# Patient Record
Sex: Female | Born: 1940 | Race: White | Hispanic: No | Smoking: Never smoker
Health system: Southern US, Community
[De-identification: ages and names within clinical notes are randomized; demographics above are authoritative.]

## PROBLEM LIST (undated history)

## (undated) DIAGNOSIS — E119 Type 2 diabetes mellitus without complications: Secondary | ICD-10-CM

## (undated) DIAGNOSIS — I4891 Unspecified atrial fibrillation: Secondary | ICD-10-CM

## (undated) DIAGNOSIS — R42 Dizziness and giddiness: Secondary | ICD-10-CM

## (undated) DIAGNOSIS — I10 Essential (primary) hypertension: Secondary | ICD-10-CM

## (undated) DIAGNOSIS — E7800 Pure hypercholesterolemia, unspecified: Secondary | ICD-10-CM

## (undated) HISTORY — PX: CHOLECYSTECTOMY: SHX55

## (undated) HISTORY — PX: CYST EXCISION: SHX5701

## (undated) HISTORY — PX: ABDOMINAL HYSTERECTOMY: SHX81

---

## 2003-10-10 ENCOUNTER — Ambulatory Visit (HOSPITAL_COMMUNITY): Admission: RE | Admit: 2003-10-10 | Discharge: 2003-10-10 | Payer: Self-pay | Admitting: Family Medicine

## 2003-10-10 ENCOUNTER — Encounter: Payer: Self-pay | Admitting: Family Medicine

## 2006-03-31 ENCOUNTER — Ambulatory Visit (HOSPITAL_COMMUNITY): Admission: RE | Admit: 2006-03-31 | Discharge: 2006-03-31 | Payer: Self-pay | Admitting: Emergency Medicine

## 2007-10-27 ENCOUNTER — Ambulatory Visit (HOSPITAL_COMMUNITY): Admission: RE | Admit: 2007-10-27 | Discharge: 2007-10-27 | Payer: Self-pay | Admitting: Family Medicine

## 2007-11-17 ENCOUNTER — Ambulatory Visit (HOSPITAL_COMMUNITY): Admission: RE | Admit: 2007-11-17 | Discharge: 2007-11-17 | Payer: Self-pay | Admitting: Internal Medicine

## 2007-11-17 ENCOUNTER — Ambulatory Visit: Payer: Self-pay | Admitting: Internal Medicine

## 2008-10-29 ENCOUNTER — Ambulatory Visit (HOSPITAL_COMMUNITY): Admission: RE | Admit: 2008-10-29 | Discharge: 2008-10-29 | Payer: Self-pay | Admitting: Family Medicine

## 2009-10-31 ENCOUNTER — Ambulatory Visit (HOSPITAL_COMMUNITY): Admission: RE | Admit: 2009-10-31 | Discharge: 2009-10-31 | Payer: Self-pay | Admitting: Family Medicine

## 2009-12-05 ENCOUNTER — Ambulatory Visit (HOSPITAL_COMMUNITY): Admission: RE | Admit: 2009-12-05 | Discharge: 2009-12-05 | Payer: Self-pay | Admitting: Neurology

## 2010-05-19 ENCOUNTER — Ambulatory Visit (HOSPITAL_COMMUNITY): Admission: RE | Admit: 2010-05-19 | Discharge: 2010-05-19 | Payer: Self-pay | Admitting: Family Medicine

## 2010-11-02 ENCOUNTER — Ambulatory Visit (HOSPITAL_COMMUNITY): Admission: RE | Admit: 2010-11-02 | Discharge: 2010-11-02 | Payer: Self-pay | Admitting: Family Medicine

## 2011-05-11 NOTE — Op Note (Signed)
NAME:  Deborah Benjamin, Deborah Benjamin              ACCOUNT NO.:  [id number]   MEDICAL RECORD NO.:  [id number]          PATIENT TYPE:  AMB   LOCATION:  DAY                           FACILITY:  [facility]   PHYSICIAN:  R. Roetta Sessions, M.D. DATE OF BIRTH:  10-01-1941   DATE OF PROCEDURE:  11/17/2007  DATE OF DISCHARGE:  11/17/2007                               OPERATIVE REPORT   PROCEDURE:  Screening colonoscopy.   INDICATIONS FOR PROCEDURE:  A 70 year old lady sent over out of the  courtesy of Dr. Katharine Look for colorectal cancer screening.  Ms.  Soza has never had her lower GI tract imaged previously.  She has no  lower GI tract symptoms.  There is no family history of colorectal  neoplasia.  Colonoscopy is now being done as a standard screening  maneuver.  This approach has been discussed with the patient at length.  Potential risks, benefits and alternatives have been reviewed and  questions answered.  Please see documentation on the medical record.   PROCEDURE NOTE:  O2 saturation, blood pressure, pulses, and respirations  were monitored throughout the entire procedure.  Conscious sedation with  IV Demerol 100 mg and Versed 5 mg in divided doses.   INSTRUMENT:  Pentax video chip system.   FINDINGS:  Digital rectal exam revealed no abnormalities.   ENDOSCOPIC FINDINGS:  Prep was adequate.   COLON:  The colonic mucosa was surveyed from the rectosigmoid junction  through the left transverse, right colon, the appendiceal orifice, the  ileocecal valve, and cecum.  These structures were well seen and  photographed for the record.  From this level, the scope was slowly and  cautiously withdrawn.  All previously mentioned mucosal surfaces were  again seen.  The patient was noted to have left-sided diverticulum.  The  colonic mucosa appeared normal.  The scope was pulled down into the  rectum, where a thorough examination of the rectal mucosa, including a  retroflexed view of the anal verge,  demonstrated no abnormalities.  The  patient tolerated the procedure well and was reactive.   ENDOSCOPY IMPRESSION:  1. Normal rectum.  2. Left-sided diverticulum.  3. Her colonic mucosa appeared normal.   RECOMMENDATIONS:  1. Diverticulosis literature provided for Ms. Duclos.  2. Recommend repeat screening colonoscopy in 10 years.      Jonathon Bellows, M.D.  Electronically Signed     RMR/MEDQ  D:  12/01/2007  T:  12/02/2007  Job:  [id number]   cc:   Mila Homer. Sudie Bailey, M.D.  Fax: [phone]

## 2011-09-24 ENCOUNTER — Other Ambulatory Visit (HOSPITAL_COMMUNITY): Payer: Self-pay | Admitting: Family Medicine

## 2011-09-24 DIAGNOSIS — Z139 Encounter for screening, unspecified: Secondary | ICD-10-CM

## 2011-11-05 ENCOUNTER — Ambulatory Visit (HOSPITAL_COMMUNITY): Payer: Medicare Other

## 2011-11-08 ENCOUNTER — Ambulatory Visit (HOSPITAL_COMMUNITY)
Admission: RE | Admit: 2011-11-08 | Discharge: 2011-11-08 | Disposition: A | Discharge status: Home / Self Care | Payer: Medicare Other | Source: Ambulatory Visit | Attending: Family Medicine | Admitting: Family Medicine

## 2011-11-08 DIAGNOSIS — Z1231 Encounter for screening mammogram for malignant neoplasm of breast: Secondary | ICD-10-CM | POA: Insufficient documentation

## 2011-11-08 DIAGNOSIS — Z139 Encounter for screening, unspecified: Secondary | ICD-10-CM

## 2012-11-01 ENCOUNTER — Other Ambulatory Visit (HOSPITAL_COMMUNITY): Payer: Self-pay | Admitting: Family Medicine

## 2012-11-01 DIAGNOSIS — M810 Age-related osteoporosis without current pathological fracture: Secondary | ICD-10-CM

## 2012-11-03 ENCOUNTER — Ambulatory Visit (HOSPITAL_COMMUNITY)
Admission: RE | Admit: 2012-11-03 | Discharge: 2012-11-03 | Disposition: A | Discharge status: Home / Self Care | Payer: Medicare Other | Source: Ambulatory Visit | Attending: Family Medicine | Admitting: Family Medicine

## 2012-11-03 ENCOUNTER — Other Ambulatory Visit (HOSPITAL_COMMUNITY): Payer: Medicare Other

## 2012-11-03 DIAGNOSIS — E559 Vitamin D deficiency, unspecified: Secondary | ICD-10-CM | POA: Insufficient documentation

## 2012-11-03 DIAGNOSIS — Z780 Asymptomatic menopausal state: Secondary | ICD-10-CM | POA: Insufficient documentation

## 2012-11-03 DIAGNOSIS — M810 Age-related osteoporosis without current pathological fracture: Secondary | ICD-10-CM | POA: Insufficient documentation

## 2012-11-22 ENCOUNTER — Other Ambulatory Visit (HOSPITAL_COMMUNITY): Payer: Self-pay | Admitting: Family Medicine

## 2012-11-22 DIAGNOSIS — Z139 Encounter for screening, unspecified: Secondary | ICD-10-CM

## 2012-11-30 ENCOUNTER — Ambulatory Visit (HOSPITAL_COMMUNITY): Payer: Medicare Other

## 2012-12-07 ENCOUNTER — Ambulatory Visit (HOSPITAL_COMMUNITY)
Admission: RE | Admit: 2012-12-07 | Discharge: 2012-12-07 | Disposition: A | Discharge status: Home / Self Care | Payer: Medicare Other | Source: Ambulatory Visit | Attending: Family Medicine | Admitting: Family Medicine

## 2012-12-07 DIAGNOSIS — Z1231 Encounter for screening mammogram for malignant neoplasm of breast: Secondary | ICD-10-CM | POA: Insufficient documentation

## 2012-12-07 DIAGNOSIS — Z139 Encounter for screening, unspecified: Secondary | ICD-10-CM

## 2013-06-26 ENCOUNTER — Encounter (HOSPITAL_COMMUNITY): Payer: Self-pay | Admitting: *Deleted

## 2013-06-26 ENCOUNTER — Emergency Department (HOSPITAL_COMMUNITY)
Admission: EM | Admit: 2013-06-26 | Discharge: 2013-06-26 | Disposition: A | Discharge status: Home / Self Care | Payer: Medicare Other | Attending: Emergency Medicine | Admitting: Emergency Medicine

## 2013-06-26 DIAGNOSIS — I10 Essential (primary) hypertension: Secondary | ICD-10-CM | POA: Insufficient documentation

## 2013-06-26 DIAGNOSIS — E860 Dehydration: Secondary | ICD-10-CM | POA: Insufficient documentation

## 2013-06-26 DIAGNOSIS — D649 Anemia, unspecified: Secondary | ICD-10-CM | POA: Insufficient documentation

## 2013-06-26 DIAGNOSIS — R112 Nausea with vomiting, unspecified: Secondary | ICD-10-CM | POA: Insufficient documentation

## 2013-06-26 DIAGNOSIS — H538 Other visual disturbances: Secondary | ICD-10-CM | POA: Insufficient documentation

## 2013-06-26 DIAGNOSIS — E7800 Pure hypercholesterolemia, unspecified: Secondary | ICD-10-CM | POA: Insufficient documentation

## 2013-06-26 DIAGNOSIS — Z79899 Other long term (current) drug therapy: Secondary | ICD-10-CM | POA: Insufficient documentation

## 2013-06-26 DIAGNOSIS — R42 Dizziness and giddiness: Secondary | ICD-10-CM | POA: Insufficient documentation

## 2013-06-26 DIAGNOSIS — E119 Type 2 diabetes mellitus without complications: Secondary | ICD-10-CM | POA: Insufficient documentation

## 2013-06-26 HISTORY — DX: Pure hypercholesterolemia, unspecified: E78.00

## 2013-06-26 HISTORY — DX: Dizziness and giddiness: R42

## 2013-06-26 HISTORY — DX: Type 2 diabetes mellitus without complications: E11.9

## 2013-06-26 HISTORY — DX: Essential (primary) hypertension: I10

## 2013-06-26 LAB — POCT I-STAT, CHEM 8
Chloride: 90 mEq/L — ABNORMAL LOW (ref 96–112)
HCT: 29 % — ABNORMAL LOW (ref 36.0–46.0)
Hemoglobin: 9.9 g/dL — ABNORMAL LOW (ref 12.0–15.0)
Potassium: 3.3 mEq/L — ABNORMAL LOW (ref 3.5–5.1)
Sodium: 130 mEq/L — ABNORMAL LOW (ref 135–145)

## 2013-06-26 LAB — CBC WITH DIFFERENTIAL/PLATELET
Basophils Absolute: 0 10*3/uL (ref 0.0–0.1)
Basophils Relative: 0 % (ref 0–1)
HCT: 26.8 % — ABNORMAL LOW (ref 36.0–46.0)
Hemoglobin: 9.7 g/dL — ABNORMAL LOW (ref 12.0–15.0)
Lymphocytes Relative: 13 % (ref 12–46)
Monocytes Absolute: 0.8 10*3/uL (ref 0.1–1.0)
Monocytes Relative: 6 % (ref 3–12)
Neutro Abs: 9.7 10*3/uL — ABNORMAL HIGH (ref 1.7–7.7)
Neutrophils Relative %: 80 % — ABNORMAL HIGH (ref 43–77)
WBC: 12.1 10*3/uL — ABNORMAL HIGH (ref 4.0–10.5)

## 2013-06-26 MED ORDER — ONDANSETRON HCL 4 MG/2ML IJ SOLN
4.0000 mg | Freq: Once | INTRAMUSCULAR | Status: DC
  Filled 2013-06-26: qty 2

## 2013-06-26 MED ORDER — MECLIZINE HCL 12.5 MG PO TABS
25.0000 mg | ORAL_TABLET | Freq: Once | ORAL | Status: AC
Start: 2013-06-26 — End: 2013-06-26
  Administered 2013-06-26: 25 mg via ORAL
  Filled 2013-06-26: qty 2

## 2013-06-26 MED ORDER — SODIUM CHLORIDE 0.9 % IV BOLUS (SEPSIS)
1000.0000 mL | Freq: Once | INTRAVENOUS | Status: AC
  Administered 2013-06-26: 1000 mL via INTRAVENOUS

## 2013-06-26 MED ORDER — ONDANSETRON HCL 4 MG PO TABS: 4.0000 mg | ORAL_TABLET | Freq: Three times a day (TID) | ORAL | Status: DC | PRN

## 2013-06-26 MED ORDER — MECLIZINE HCL 25 MG PO TABS: ORAL_TABLET | ORAL | Status: DC

## 2013-06-26 NOTE — ED Notes (Signed)
Patient stated she "was fine as long as she did not lay down".  Patient sitting upright in bed - comfortable.

## 2013-06-26 NOTE — ED Notes (Signed)
Patient unable to lay down.  Nausea/vomiting starts when patient lays down.  Patient became dizzy with nausea and we did not complete the vital signs laying down.

## 2013-06-26 NOTE — ED Notes (Signed)
Pt sent to er from urgent care with c/o dizziness that started yesterday am when she went to get out of bed, pt states that the dizziness is worse when she lays down, is associated with n/v, has had vertigo before but not like this. Pt speech clear, face symmetrical, no weakness noted in grips or weakness with walking. Denies any pain.

## 2013-06-26 NOTE — ED Notes (Signed)
Patient has vertigo with lying down, associated w/nausea and vomiting.  Otherwise has been feeling well.  No carotid bruits, no CP.

## 2013-06-26 NOTE — ED Provider Notes (Addendum)
History  This chart was scribed for [name], MD, by Yevette Edwards, ED Scribe. This patient was seen in room [facility] and the patient's care was started at 11: AM  CSN: [id number] Arrival date & time 06/26/13  1032  First MD Initiated Contact with Patient 06/26/13 1045     Chief Complaint  Patient presents with  . Dizziness    The history is provided by the patient. No language interpreter was used.   HPI Comments:  Deborah Benjamin is a 72 y.o. female who presents to the Emergency Department complaining of dizziness which began yesterday morning. The pt states that the dizziness is exacerbated upon laying down, and that the dizziness causes her to experience emesis. She reports she has experienced emesis multiple times over the past two days. The pt states that when she experiences dizziness, she feels as if the room is spinning, she has color changes in her vision, and she feels as if she is going to "pass out." She denies a headache, numbness or tingling in her extremities, and frequency.  The pt states she has experienced vertigo in the past that was when she stood up, not lay down, but she denies experiencing prior episodes with this severe of symptoms. The pt's daughter states that the pt is always pale. She has a h/o of DM without complication, HTN, and hypercholesterolia. She denies both smoking and drinking alcohol. She has had vomiting x 8 since yesterday. States when she spins she feel like she is going to pass out.  She denies chest pain, SOB.   Dr. Sudie Bailey is her PCP. She lives independently.     Past Medical History  Diagnosis Date  . Vertigo   . Diabetes mellitus without complication   . Hypertension   . High cholesterol    Past Surgical History  Procedure Laterality Date  . Abdominal hysterectomy    . Cyst excision    . Cholecystectomy     No family history on file. History  Substance Use Topics  . Smoking status: Never Smoker   . Smokeless tobacco:  Not on file  . Alcohol Use: No   Lives at home Lives alone  No OB history provided.   Review of Systems  Eyes: Positive for visual disturbance.  Genitourinary: Negative for frequency.  Neurological: Positive for dizziness and light-headedness. Negative for weakness and headaches.  All other systems reviewed and are negative.    Allergies  Morphine and related  Home Medications   Current Outpatient Rx  Name  Route  Sig  Dispense  Refill  . acetaminophen (TYLENOL) 500 MG tablet   Oral   Take 500 mg by mouth daily as needed for pain.         Marland Kitchen atenolol (TENORMIN) 25 MG tablet   Oral   Take 25 mg by mouth daily.         Marland Kitchen atorvastatin (LIPITOR) 40 MG tablet   Oral   Take 40 mg by mouth daily.         . cholecalciferol (VITAMIN D) 1000 UNITS tablet   Oral   Take 1,000 Units by mouth daily.         . furosemide (LASIX) 40 MG tablet   Oral   Take 40 mg by mouth.         . Glucosamine-Chondroitin (MOVE FREE PO)   Oral   Take 1 tablet by mouth daily.         Marland Kitchen lisinopril-hydrochlorothiazide (PRINZIDE,ZESTORETIC)  20-25 MG per tablet   Oral   Take 1 tablet by mouth daily.         . Multiple Vitamin (MULTIVITAMIN WITH MINERALS) TABS   Oral   Take 1 tablet by mouth daily.         Marland Kitchen omeprazole (PRILOSEC) 20 MG capsule   Oral   Take 20 mg by mouth daily.         . potassium chloride SA (K-DUR,KLOR-CON) 20 MEQ tablet   Oral   Take 20 mEq by mouth daily.         . vitamin B-12 (CYANOCOBALAMIN) 1000 MCG tablet   Oral   Take 2,000 mcg by mouth daily.         . meclizine (ANTIVERT) 25 MG tablet      Take 1 or 2 po Q 6hrs for dizziness   30 tablet   0   . metFORMIN (GLUCOPHAGE) 500 MG tablet   Oral   Take 1,000 mg by mouth 2 (two) times daily with a meal.         . ondansetron (ZOFRAN) 4 MG tablet   Oral   Take 1 tablet (4 mg total) by mouth every 8 (eight) hours as needed for nausea (or dizziness).   12 tablet   0     Triage  Vitals: BP 137/56  Pulse 70  Temp(Src) 97.7 F (36.5 C)  Resp 20  Ht 5\' 5"  (1.651 m)  Wt 186 lb (84.369 kg)  BMI 30.95 kg/m2  SpO2 100%  Vital signs normal    Physical Exam  Nursing note and vitals reviewed. Constitutional: She is oriented to person, place, and time. She appears well-developed and well-nourished.  Non-toxic appearance. She does not appear ill. No distress.  She looks pale.   HENT:  Head: Normocephalic and atraumatic.  Right Ear: External ear normal.  Left Ear: External ear normal.  Nose: Nose normal. No mucosal edema or rhinorrhea.  Mouth/Throat: Oropharynx is clear and moist and mucous membranes are normal. No dental abscesses or edematous.  Eyes: Conjunctivae and EOM are normal. Pupils are equal, round, and reactive to light.  No nystagymus.   Neck: Normal range of motion and full passive range of motion without pain. Neck supple.  Cardiovascular: Normal rate, regular rhythm and normal heart sounds.  Exam reveals no gallop and no friction rub.   No murmur heard. Faint systolic murmur, lower left external border.   Pulmonary/Chest: Effort normal and breath sounds normal. No respiratory distress. She has no wheezes. She has no rhonchi. She has no rales. She exhibits no tenderness and no crepitus.  Abdominal: Soft. Normal appearance and bowel sounds are normal. She exhibits no distension. There is no tenderness. There is no rebound and no guarding.  Musculoskeletal: Normal range of motion. She exhibits no edema and no tenderness.  Moves all extremities well.   Neurological: She is alert and oriented to person, place, and time. She has normal strength. No cranial nerve deficit.  Skin: Skin is warm, dry and intact. No rash noted. No erythema. No pallor.  Psychiatric: She has a normal mood and affect. Her speech is normal and behavior is normal. Her mood appears not anxious.    ED Course  Procedures (including critical care time)  Medications  ondansetron  (ZOFRAN) injection 4 mg (4 mg Intravenous Not Given 06/26/13 1223)  sodium chloride 0.9 % bolus 1,000 mL (0 mLs Intravenous Stopped 06/26/13 1437)  meclizine (ANTIVERT) tablet 25 mg (25 mg Oral  Given 06/26/13 1206)     DIAGNOSTIC STUDIES: Oxygen Saturation is 100% on room air, normal by my interpretation.    COORDINATION OF CARE:  11:02 AM- Informed pt of treatment plan which includes blood work, anti-emetic medication, and IV fluids, and pt agreed.   12:53 PM- Rechecked pt and informed her of the blood work results, including a diagnosis of anemia. When questioned at bedside, the pt reported she had experienced black bowel movements but is taking iron.  Pt states she is always anemia, cannot tell me a number but is on iron b/o it.   Feeling better at discharge, is able to lay flat without symptoms. Ready to go home.    Results for orders placed during the hospital encounter of 06/26/13  CBC WITH DIFFERENTIAL      Result Value Range   WBC 12.1 (*) 4.0 - 10.5 K/uL   RBC 3.16 (*) 3.87 - 5.11 MIL/uL   Hemoglobin 9.7 (*) 12.0 - 15.0 g/dL   HCT 16.1 (*) 09.6 - 04.5 %   MCV 84.8  78.0 - 100.0 fL   MCH 30.7  26.0 - 34.0 pg   MCHC 36.2 (*) 30.0 - 36.0 g/dL   RDW 40.9  81.1 - 91.4 %   Platelets 408 (*) 150 - 400 K/uL   Neutrophils Relative % 80 (*) 43 - 77 %   Neutro Abs 9.7 (*) 1.7 - 7.7 K/uL   Lymphocytes Relative 13  12 - 46 %   Lymphs Abs 1.6  0.7 - 4.0 K/uL   Monocytes Relative 6  3 - 12 %   Monocytes Absolute 0.8  0.1 - 1.0 K/uL   Eosinophils Relative 0  0 - 5 %   Eosinophils Absolute 0.0  0.0 - 0.7 K/uL   Basophils Relative 0  0 - 1 %   Basophils Absolute 0.0  0.0 - 0.1 K/uL  POCT I-STAT, CHEM 8      Result Value Range   Sodium 130 (*) 135 - 145 mEq/L   Potassium 3.3 (*) 3.5 - 5.1 mEq/L   Chloride 90 (*) 96 - 112 mEq/L   BUN 14  6 - 23 mg/dL   Creatinine, Ser 7.82  0.50 - 1.10 mg/dL   Glucose, Bld 956 (*) 70 - 99 mg/dL   Calcium, Ion 2.13 (*) 1.13 - 1.30 mmol/L   TCO2 26  0  - 100 mmol/L   Hemoglobin 9.9 (*) 12.0 - 15.0 g/dL   HCT 08.6 (*) 57.8 - 46.9 %   Laboratory interpretation all normal except    Date: 06/26/2013  Rate: 67  Rhythm: normal sinus rhythm  QRS Axis: normal  Intervals: normal  ST/T Wave abnormalities: normal  Conduction Disutrbances:none  Narrative Interpretation:   Old EKG Reviewed: none available      1. Vertigo   2. Nausea and vomiting   3. Dehydration   4. Anemia     Plan discharge   Discharge Medication List as of 06/26/2013  2:26 PM    START taking these medications   Details  meclizine (ANTIVERT) 25 MG tablet Take 1 or 2 po Q 6hrs for dizziness, Print    ondansetron (ZOFRAN) 4 MG tablet Take 1 tablet (4 mg total) by mouth every 8 (eight) hours as needed for nausea (or dizziness)., Starting 06/26/2013, Until Discontinued, Print        Devoria Albe, MD, FACEP   MDM    I personally performed the services described in this documentation, which was scribed  in my presence. The recorded information has been reviewed and considered.  Devoria Albe, MD, FACEP    [name], MD 06/26/13 1610  [name], MD 06/26/13 1623

## 2013-10-30 ENCOUNTER — Other Ambulatory Visit (HOSPITAL_COMMUNITY): Payer: Self-pay | Admitting: Family Medicine

## 2013-10-30 DIAGNOSIS — Z139 Encounter for screening, unspecified: Secondary | ICD-10-CM

## 2013-12-10 ENCOUNTER — Ambulatory Visit (HOSPITAL_COMMUNITY)
Admission: RE | Admit: 2013-12-10 | Discharge: 2013-12-10 | Disposition: A | Discharge status: Home / Self Care | Payer: Medicare Other | Source: Ambulatory Visit | Attending: Family Medicine | Admitting: Family Medicine

## 2013-12-10 DIAGNOSIS — Z139 Encounter for screening, unspecified: Secondary | ICD-10-CM

## 2013-12-10 DIAGNOSIS — Z1231 Encounter for screening mammogram for malignant neoplasm of breast: Secondary | ICD-10-CM | POA: Insufficient documentation

## 2014-05-13 ENCOUNTER — Emergency Department (HOSPITAL_COMMUNITY)
Admission: EM | Admit: 2014-05-13 | Discharge: 2014-05-13 | Disposition: A | Discharge status: Home / Self Care | Payer: Medicare Other | Attending: Emergency Medicine | Admitting: Emergency Medicine

## 2014-05-13 ENCOUNTER — Encounter (HOSPITAL_COMMUNITY): Payer: Self-pay | Admitting: Emergency Medicine

## 2014-05-13 DIAGNOSIS — I10 Essential (primary) hypertension: Secondary | ICD-10-CM | POA: Insufficient documentation

## 2014-05-13 DIAGNOSIS — E119 Type 2 diabetes mellitus without complications: Secondary | ICD-10-CM | POA: Insufficient documentation

## 2014-05-13 DIAGNOSIS — M2550 Pain in unspecified joint: Secondary | ICD-10-CM | POA: Insufficient documentation

## 2014-05-13 DIAGNOSIS — Z79899 Other long term (current) drug therapy: Secondary | ICD-10-CM | POA: Insufficient documentation

## 2014-05-13 DIAGNOSIS — R42 Dizziness and giddiness: Secondary | ICD-10-CM | POA: Insufficient documentation

## 2014-05-13 DIAGNOSIS — E7800 Pure hypercholesterolemia, unspecified: Secondary | ICD-10-CM | POA: Insufficient documentation

## 2014-05-13 LAB — CBC WITH DIFFERENTIAL/PLATELET
Basophils Absolute: 0 10*3/uL (ref 0.0–0.1)
Basophils Relative: 1 % (ref 0–1)
Eosinophils Absolute: 0 10*3/uL (ref 0.0–0.7)
Eosinophils Relative: 0 % (ref 0–5)
HEMATOCRIT: 32.1 % — AB (ref 36.0–46.0)
HEMOGLOBIN: 10.9 g/dL — AB (ref 12.0–15.0)
LYMPHS PCT: 15 % (ref 12–46)
Lymphs Abs: 1.3 10*3/uL (ref 0.7–4.0)
MCH: 30 pg (ref 26.0–34.0)
MCHC: 34 g/dL (ref 30.0–36.0)
MCV: 88.4 fL (ref 78.0–100.0)
MONO ABS: 0.3 10*3/uL (ref 0.1–1.0)
Monocytes Relative: 3 % (ref 3–12)
NEUTROS ABS: 6.8 10*3/uL (ref 1.7–7.7)
Neutrophils Relative %: 81 % — ABNORMAL HIGH (ref 43–77)
Platelets: 409 10*3/uL — ABNORMAL HIGH (ref 150–400)
RBC: 3.63 MIL/uL — AB (ref 3.87–5.11)
RDW: 12 % (ref 11.5–15.5)
WBC: 8.4 10*3/uL (ref 4.0–10.5)

## 2014-05-13 LAB — URINALYSIS, ROUTINE W REFLEX MICROSCOPIC
Bilirubin Urine: NEGATIVE
Glucose, UA: NEGATIVE mg/dL
Hgb urine dipstick: NEGATIVE
Ketones, ur: NEGATIVE mg/dL
Leukocytes, UA: NEGATIVE
NITRITE: NEGATIVE
Protein, ur: NEGATIVE mg/dL
Specific Gravity, Urine: 1.015 (ref 1.005–1.030)
UROBILINOGEN UA: 0.2 mg/dL (ref 0.0–1.0)
pH: 7 (ref 5.0–8.0)

## 2014-05-13 LAB — COMPREHENSIVE METABOLIC PANEL
ALT: 12 U/L (ref 0–35)
AST: 17 U/L (ref 0–37)
Albumin: 3.9 g/dL (ref 3.5–5.2)
Alkaline Phosphatase: 73 U/L (ref 39–117)
BILIRUBIN TOTAL: 0.4 mg/dL (ref 0.3–1.2)
BUN: 16 mg/dL (ref 6–23)
CHLORIDE: 100 meq/L (ref 96–112)
CO2: 25 meq/L (ref 19–32)
CREATININE: 0.81 mg/dL (ref 0.50–1.10)
Calcium: 9.4 mg/dL (ref 8.4–10.5)
GFR calc Af Amer: 83 mL/min — ABNORMAL LOW (ref >90)
GFR, EST NON AFRICAN AMERICAN: 71 mL/min — AB (ref >90)
Glucose, Bld: 180 mg/dL — ABNORMAL HIGH (ref 70–99)
Potassium: 4.2 mEq/L (ref 3.7–5.3)
Sodium: 140 mEq/L (ref 137–147)
Total Protein: 6.9 g/dL (ref 6.0–8.3)

## 2014-05-13 LAB — CBG MONITORING, ED: Glucose-Capillary: 161 mg/dL — ABNORMAL HIGH (ref 70–99)

## 2014-05-13 MED ORDER — MECLIZINE HCL 12.5 MG PO TABS
25.0000 mg | ORAL_TABLET | Freq: Once | ORAL | Status: AC
  Administered 2014-05-13: 25 mg via ORAL
  Filled 2014-05-13: qty 2

## 2014-05-13 MED ORDER — ONDANSETRON HCL 4 MG PO TABS
4.0000 mg | ORAL_TABLET | Freq: Once | ORAL | Status: AC
  Administered 2014-05-13: 4 mg via ORAL
  Filled 2014-05-13: qty 1

## 2014-05-13 MED ORDER — SODIUM CHLORIDE 0.9 % IV BOLUS (SEPSIS)
500.0000 mL | Freq: Once | INTRAVENOUS | Status: AC
  Administered 2014-05-13: 500 mL via INTRAVENOUS

## 2014-05-13 NOTE — ED Notes (Signed)
Pt c/o vertigo since Thursday with nausea since last night. No neuro deficits.

## 2014-05-13 NOTE — ED Provider Notes (Signed)
CSN: [id number]     Arrival date & time    History   First MD Initiated Contact with Patient 05/13/14 [id number]     Chief Complaint  Patient presents with  . Dizziness     (Consider location/radiation/quality/duration/timing/severity/associated sxs/prior Treatment) HPI Comments: Patient is a 73 year old female who presents to the emergency department with a complaint of" dizziness". Patient states that for the past 4 days she's been having sensation of everything spinning around her. She says this has led to nausea and at times dry heave type vomiting. The patient states she has not had any recent infection. She has not had any injury to her head. She's not had any palpitations or chest pain related to these dizzy episodes. She's not had any unusual headache reported. She's not had any loss of consciousness. There's been no numbness or tingling or of the face or extremities. The patient states that she has a history of anemia, but she has not noted any blood in her stools or in her vomitus or in her urine. She's not had any recent high fevers. She has been treated for this problem in the past, with meclizine. The patient states that on 2 of the 4 days that she was sick she tried the meclizine and it did help her to feel better. She states that on last evening she began to be nauseated and have" dry heaves" and felt that she should come to the emergency department to be evaluated.                                                                                  The history is provided by the patient.    Past Medical History  Diagnosis Date  . Vertigo   . Diabetes mellitus without complication   . Hypertension   . High cholesterol    Past Surgical History  Procedure Laterality Date  . Abdominal hysterectomy    . Cyst excision    . Cholecystectomy     No family history on file. History  Substance Use Topics  . Smoking status: Never Smoker   . Smokeless tobacco: Not on file  . Alcohol Use: No    OB History   Grav Para Term Preterm Abortions TAB SAB Ect Mult Living                 Review of Systems  Constitutional: Negative for activity change.       All ROS Neg except as noted in HPI  HENT: Negative.   Eyes: Negative for photophobia and discharge.  Respiratory: Negative for cough, shortness of breath and wheezing.   Cardiovascular: Negative for chest pain and palpitations.  Gastrointestinal: Negative for abdominal pain and blood in stool.  Genitourinary: Negative for dysuria, frequency and hematuria.  Musculoskeletal: Positive for arthralgias. Negative for back pain and neck pain.  Skin: Negative.   Neurological: Positive for dizziness and light-headedness. Negative for seizures and speech difficulty.  Psychiatric/Behavioral: Negative for hallucinations and confusion.      Allergies  Morphine and related  Home Medications   Prior to Admission medications   Medication Sig Start Date End Date Taking? Authorizing Provider  acetaminophen (TYLENOL)  500 MG tablet Take 500 mg by mouth daily as needed for pain.    Historical Provider, MD  atenolol (TENORMIN) 25 MG tablet Take 25 mg by mouth daily.    Historical Provider, MD  atorvastatin (LIPITOR) 40 MG tablet Take 40 mg by mouth daily.    Historical Provider, MD  cholecalciferol (VITAMIN D) 1000 UNITS tablet Take 1,000 Units by mouth daily.    Historical Provider, MD  furosemide (LASIX) 40 MG tablet Take 40 mg by mouth.    Historical Provider, MD  Glucosamine-Chondroitin (MOVE FREE PO) Take 1 tablet by mouth daily.    Historical Provider, MD  lisinopril-hydrochlorothiazide (PRINZIDE,ZESTORETIC) 20-25 MG per tablet Take 1 tablet by mouth daily.    Historical Provider, MD  meclizine (ANTIVERT) 25 MG tablet Take 1 or 2 po Q 6hrs for dizziness 06/26/13   [name], MD  metFORMIN (GLUCOPHAGE) 500 MG tablet Take 1,000 mg by mouth 2 (two) times daily with a meal.    Historical Provider, MD  Multiple Vitamin (MULTIVITAMIN WITH  MINERALS) TABS Take 1 tablet by mouth daily.    Historical Provider, MD  omeprazole (PRILOSEC) 20 MG capsule Take 20 mg by mouth daily.    Historical Provider, MD  ondansetron (ZOFRAN) 4 MG tablet Take 1 tablet (4 mg total) by mouth every 8 (eight) hours as needed for nausea (or dizziness). 06/26/13   [name], MD  potassium chloride SA (K-DUR,KLOR-CON) 20 MEQ tablet Take 20 mEq by mouth daily.    Historical Provider, MD  vitamin B-12 (CYANOCOBALAMIN) 1000 MCG tablet Take 2,000 mcg by mouth daily.    Historical Provider, MD   BP 153/106  Pulse 116  Temp(Src) 97.7 F (36.5 C) (Oral)  Resp 20  Ht 5\' 5"  (1.651 m)  Wt 184 lb (83.462 kg)  BMI 30.62 kg/m2  SpO2 100% Physical Exam  Nursing note and vitals reviewed. Constitutional: She is oriented to person, place, and time. She appears well-developed and well-nourished.  Non-toxic appearance.  HENT:  Head: Normocephalic.  Right Ear: Tympanic membrane and external ear normal.  Left Ear: Tympanic membrane and external ear normal.  Mouth/Throat: Oropharynx is clear and moist.  Eyes: EOM and lids are normal. Pupils are equal, round, and reactive to light.  Neck: Normal range of motion. Neck supple. Carotid bruit is not present.  Cardiovascular: Normal rate, regular rhythm, normal heart sounds, intact distal pulses and normal pulses.  Exam reveals no gallop and no friction rub.   No murmur heard. Pulmonary/Chest: Breath sounds normal. No respiratory distress. She exhibits no tenderness.  Symmetrical rise and fall of the chest.  Abdominal: Soft. Bowel sounds are normal. She exhibits no mass. There is no tenderness. There is no guarding.  No pulsatile mass appreciated.  Musculoskeletal: Normal range of motion. She exhibits no edema and no tenderness.  Lymphadenopathy:       Head (right side): No submandibular adenopathy present.       Head (left side): No submandibular adenopathy present.    She has no cervical adenopathy.  Neurological: She  is alert and oriented to person, place, and time. She has normal strength. No cranial nerve deficit or sensory deficit. She exhibits normal muscle tone. Coordination normal.  Skin: Skin is warm and dry.  Psychiatric: She has a normal mood and affect. Her speech is normal.    ED Course  Procedures (including critical care time) Labs Review Labs Reviewed - No data to display  Imaging Review No results found.  EKG Interpretation None      MDM Urine analysis is well within normal limits. Capillary blood glucose was 161. Complete blood count shows a white blood cell count of 8400, hemoglobin of 10.9, hematocrit of 32.1, which was slightly low.                           platelets of 409,000. Comprehensive metabolic panel was nonacute. Patient seen with me by Dr. Elyse JarvisZamet  Patient tolerated IV fluids and meclizine without problem. Patient states she feels better after treatment in the emergency department, and feels that she can manage the dizziness at home with her current meclizine prescription. Patient will followup with her primary physician. She is to return to the emergency department if symptoms return, or if she cannot control them with her medications at home.    Final diagnoses:  None    **I have reviewed nursing notes, vital signs, and all appropriate lab and imaging results for this patient.Kathie Dike*    Geron Mulford M Cashe Gatt, PA-C 05/13/14 1920

## 2014-05-13 NOTE — Discharge Instructions (Signed)
Your chemistries are nonacute at this time. Your electrocardiogram does not show any acute event. Please take your time in changing positions. Please use your meclizine as prescribed. Please increase fluids. Please return to the emergency department or see your primary physician if you start to have uncontrolled or vomiting, or any other changes or concerns. Vertigo Vertigo means you feel like you or your surroundings are moving when they are not. Vertigo can be dangerous if it occurs when you are at work, driving, or performing difficult activities.  CAUSES  Vertigo occurs when there is a conflict of signals sent to your brain from the visual and sensory systems in your body. There are many different causes of vertigo, including:  Infections, especially in the inner ear.  A bad reaction to a drug or misuse of alcohol and medicines.  Withdrawal from drugs or alcohol.  Rapidly changing positions, such as lying down or rolling over in bed.  A migraine headache.  Decreased blood flow to the brain.  Increased pressure in the brain from a head injury, infection, tumor, or bleeding. SYMPTOMS  You may feel as though the world is spinning around or you are falling to the ground. Because your balance is upset, vertigo can cause nausea and vomiting. You may have involuntary eye movements (nystagmus). DIAGNOSIS  Vertigo is usually diagnosed by physical exam. If the cause of your vertigo is unknown, your caregiver may perform imaging tests, such as an MRI scan (magnetic resonance imaging). TREATMENT  Most cases of vertigo resolve on their own, without treatment. Depending on the cause, your caregiver may prescribe certain medicines. If your vertigo is related to body position issues, your caregiver may recommend movements or procedures to correct the problem. In rare cases, if your vertigo is caused by certain inner ear problems, you may need surgery. HOME CARE INSTRUCTIONS   Follow your caregiver's  instructions.  Avoid driving.  Avoid operating heavy machinery.  Avoid performing any tasks that would be dangerous to you or others during a vertigo episode.  Tell your caregiver if you notice that certain medicines seem to be causing your vertigo. Some of the medicines used to treat vertigo episodes can actually make them worse in some people. SEEK IMMEDIATE MEDICAL CARE IF:   Your medicines do not relieve your vertigo or are making it worse.  You develop problems with talking, walking, weakness, or using your arms, hands, or legs.  You develop severe headaches.  Your nausea or vomiting continues or gets worse.  You develop visual changes.  A family member notices behavioral changes.  Your condition gets worse. MAKE SURE YOU:  Understand these instructions.  Will watch your condition.  Will get help right away if you are not doing well or get worse. Document Released: 09/22/2005 Document Revised: 03/06/2012 Document Reviewed: 07/01/2011 [facility] Patient Information 2014 [employer], [place].

## 2014-05-15 NOTE — ED Provider Notes (Signed)
Medical screening examination/treatment/procedure(s) were performed by non-physician practitioner and as supervising physician I was immediately available for consultation/collaboration.   EKG Interpretation None        Gentry Pilson L Makaylee Spielberg, MD 05/15/14 1308 

## 2014-10-23 ENCOUNTER — Other Ambulatory Visit (HOSPITAL_COMMUNITY): Payer: Self-pay | Admitting: Family Medicine

## 2014-10-23 DIAGNOSIS — Z1231 Encounter for screening mammogram for malignant neoplasm of breast: Secondary | ICD-10-CM

## 2014-12-13 ENCOUNTER — Ambulatory Visit (HOSPITAL_COMMUNITY)
Admission: RE | Admit: 2014-12-13 | Discharge: 2014-12-13 | Disposition: A | Discharge status: Home / Self Care | Payer: Medicare Other | Source: Ambulatory Visit | Attending: Family Medicine | Admitting: Family Medicine

## 2014-12-13 DIAGNOSIS — Z1231 Encounter for screening mammogram for malignant neoplasm of breast: Secondary | ICD-10-CM | POA: Diagnosis present

## 2015-03-06 ENCOUNTER — Emergency Department (HOSPITAL_COMMUNITY): Payer: Medicare Other

## 2015-03-06 ENCOUNTER — Encounter (HOSPITAL_COMMUNITY): Payer: Self-pay | Admitting: Emergency Medicine

## 2015-03-06 ENCOUNTER — Emergency Department (HOSPITAL_COMMUNITY)
Admission: EM | Admit: 2015-03-06 | Discharge: 2015-03-06 | Disposition: A | Discharge status: Home / Self Care | Payer: Medicare Other | Attending: Emergency Medicine | Admitting: Emergency Medicine

## 2015-03-06 DIAGNOSIS — J40 Bronchitis, not specified as acute or chronic: Secondary | ICD-10-CM

## 2015-03-06 DIAGNOSIS — I10 Essential (primary) hypertension: Secondary | ICD-10-CM | POA: Diagnosis not present

## 2015-03-06 DIAGNOSIS — E119 Type 2 diabetes mellitus without complications: Secondary | ICD-10-CM | POA: Insufficient documentation

## 2015-03-06 DIAGNOSIS — E780 Pure hypercholesterolemia: Secondary | ICD-10-CM | POA: Diagnosis not present

## 2015-03-06 DIAGNOSIS — R51 Headache: Secondary | ICD-10-CM | POA: Insufficient documentation

## 2015-03-06 DIAGNOSIS — Z79899 Other long term (current) drug therapy: Secondary | ICD-10-CM | POA: Diagnosis not present

## 2015-03-06 DIAGNOSIS — R600 Localized edema: Secondary | ICD-10-CM | POA: Insufficient documentation

## 2015-03-06 DIAGNOSIS — J209 Acute bronchitis, unspecified: Secondary | ICD-10-CM | POA: Diagnosis not present

## 2015-03-06 DIAGNOSIS — R519 Headache, unspecified: Secondary | ICD-10-CM

## 2015-03-06 LAB — CBC WITH DIFFERENTIAL/PLATELET
BASOS PCT: 0 % (ref 0–1)
Basophils Absolute: 0 10*3/uL (ref 0.0–0.1)
EOS ABS: 0 10*3/uL (ref 0.0–0.7)
Eosinophils Relative: 0 % (ref 0–5)
HEMATOCRIT: 33 % — AB (ref 36.0–46.0)
HEMOGLOBIN: 11 g/dL — AB (ref 12.0–15.0)
Lymphocytes Relative: 20 % (ref 12–46)
Lymphs Abs: 2.2 10*3/uL (ref 0.7–4.0)
MCH: 30.6 pg (ref 26.0–34.0)
MCHC: 33.3 g/dL (ref 30.0–36.0)
MCV: 91.7 fL (ref 78.0–100.0)
Monocytes Absolute: 0.6 10*3/uL (ref 0.1–1.0)
Monocytes Relative: 6 % (ref 3–12)
NEUTROS ABS: 8.2 10*3/uL — AB (ref 1.7–7.7)
Neutrophils Relative %: 74 % (ref 43–77)
Platelets: 383 10*3/uL (ref 150–400)
RBC: 3.6 MIL/uL — ABNORMAL LOW (ref 3.87–5.11)
RDW: 12.2 % (ref 11.5–15.5)
WBC: 11.1 10*3/uL — AB (ref 4.0–10.5)

## 2015-03-06 LAB — COMPREHENSIVE METABOLIC PANEL
ALK PHOS: 71 U/L (ref 39–117)
ALT: 32 U/L (ref 0–35)
ANION GAP: 11 (ref 5–15)
AST: 33 U/L (ref 0–37)
Albumin: 4.3 g/dL (ref 3.5–5.2)
BUN: 16 mg/dL (ref 6–23)
CO2: 25 mmol/L (ref 19–32)
Calcium: 9.5 mg/dL (ref 8.4–10.5)
Chloride: 98 mmol/L (ref 96–112)
Creatinine, Ser: 1.02 mg/dL (ref 0.50–1.10)
GFR calc Af Amer: 62 mL/min — ABNORMAL LOW (ref >90)
GFR calc non Af Amer: 54 mL/min — ABNORMAL LOW (ref >90)
GLUCOSE: 171 mg/dL — AB (ref 70–99)
POTASSIUM: 4 mmol/L (ref 3.5–5.1)
Sodium: 134 mmol/L — ABNORMAL LOW (ref 135–145)
Total Bilirubin: 0.8 mg/dL (ref 0.3–1.2)
Total Protein: 7.2 g/dL (ref 6.0–8.3)

## 2015-03-06 LAB — URINALYSIS, ROUTINE W REFLEX MICROSCOPIC
BILIRUBIN URINE: NEGATIVE
GLUCOSE, UA: NEGATIVE mg/dL
Ketones, ur: 15 mg/dL — AB
LEUKOCYTES UA: NEGATIVE
NITRITE: NEGATIVE
PH: 5.5 (ref 5.0–8.0)
Protein, ur: NEGATIVE mg/dL
Specific Gravity, Urine: 1.02 (ref 1.005–1.030)
Urobilinogen, UA: 0.2 mg/dL (ref 0.0–1.0)

## 2015-03-06 LAB — URINE MICROSCOPIC-ADD ON

## 2015-03-06 LAB — CBG MONITORING, ED
Glucose-Capillary: 194 mg/dL — ABNORMAL HIGH (ref 70–99)
Glucose-Capillary: 217 mg/dL — ABNORMAL HIGH (ref 70–99)

## 2015-03-06 MED ORDER — METOCLOPRAMIDE HCL 5 MG/ML IJ SOLN
10.0000 mg | Freq: Once | INTRAMUSCULAR | Status: AC
  Administered 2015-03-06: 10 mg via INTRAVENOUS
  Filled 2015-03-06: qty 2

## 2015-03-06 MED ORDER — ONDANSETRON HCL 4 MG/2ML IJ SOLN
4.0000 mg | Freq: Once | INTRAMUSCULAR | Status: AC
  Administered 2015-03-06: 4 mg via INTRAVENOUS

## 2015-03-06 MED ORDER — ONDANSETRON HCL 4 MG/2ML IJ SOLN
INTRAMUSCULAR | Status: AC
  Filled 2015-03-06: qty 2

## 2015-03-06 MED ORDER — ONDANSETRON 4 MG PO TBDP: ORAL_TABLET | ORAL | Status: DC

## 2015-03-06 MED ORDER — ONDANSETRON HCL 4 MG/2ML IJ SOLN
4.0000 mg | Freq: Once | INTRAMUSCULAR | Status: AC
  Administered 2015-03-06: 4 mg via INTRAVENOUS
  Filled 2015-03-06: qty 2

## 2015-03-06 MED ORDER — SODIUM CHLORIDE 0.9 % IV BOLUS (SEPSIS)
500.0000 mL | Freq: Once | INTRAVENOUS | Status: AC
  Administered 2015-03-06: 500 mL via INTRAVENOUS

## 2015-03-06 MED ORDER — TRAMADOL HCL 50 MG PO TABS: 50.0000 mg | ORAL_TABLET | Freq: Four times a day (QID) | ORAL | Status: DC | PRN

## 2015-03-06 MED ORDER — LEVOFLOXACIN 500 MG PO TABS: 500.0000 mg | ORAL_TABLET | Freq: Every day | ORAL | Status: DC

## 2015-03-06 MED ORDER — HYDROMORPHONE HCL 1 MG/ML IJ SOLN
1.0000 mg | Freq: Once | INTRAMUSCULAR | Status: AC
  Administered 2015-03-06: 1 mg via INTRAVENOUS
  Filled 2015-03-06: qty 1

## 2015-03-06 NOTE — Discharge Instructions (Signed)
Drink plenty of fluids and follow up with your md next week °

## 2015-03-06 NOTE — ED Notes (Signed)
Pt was given a snack of graham crackers and peanut butter about an hour ago. Pt not tolerating, says it's making her feel sick. Pt feels "shaky" stating, "I think my sugar is low." CBG checked, it was 217.

## 2015-03-06 NOTE — ED Notes (Signed)
In and out done by Jazziel Fitzsimmons and denisa

## 2015-03-06 NOTE — ED Provider Notes (Signed)
CSN: [id number]     Arrival date & time 03/06/15  [id number] History  This chart was scribed for Bethann BerkshireJoseph Junaid Wurzer, MD by Tonye RoyaltyJoshua Chen, ED Scribe. This patient was seen in room APA06/APA06 and the patient's care was started at 9:12 AM.    Chief Complaint  Patient presents with  . Headache   Patient is a 74 y.o. female presenting with headaches. The history is provided by the patient. No language interpreter was used.  Headache Pain location:  L parietal and R parietal Quality:  Unable to specify Radiates to:  Does not radiate Onset quality:  Sudden Duration:  1 day Timing:  Constant Progression:  Unchanged Chronicity:  New Similar to prior headaches: no   Context: coughing   Relieved by:  Acetaminophen Worsened by:  Nothing Ineffective treatments:  None tried Associated symptoms: cough, dizziness and nausea   Associated symptoms: no abdominal pain, no back pain, no congestion, no diarrhea, no fatigue, no fever, no neck pain, no seizures, no sinus pressure and no vomiting   Cough:    Cough characteristics:  Non-productive   HPI Comments: Deborah Benjamin is a 74 y.o. female who presents to the Emergency Department complaining of headache with onset yesterday. She locates it to the top of her head. She reports associated nausea, coughing, and dizziness. She states cough is not productive. She states she has used Tylenol only. She states he had a flu shot. She denies vomiting, neck pain, fever, or chills,  Past Medical History  Diagnosis Date  . Vertigo   . Diabetes mellitus without complication   . Hypertension   . High cholesterol    Past Surgical History  Procedure Laterality Date  . Abdominal hysterectomy    . Cyst excision    . Cholecystectomy     Family History  Problem Relation Age of Onset  . Diabetes Sister    History  Substance Use Topics  . Smoking status: Never Smoker   . Smokeless tobacco: Never Used  . Alcohol Use: No   OB History    Gravida Para Term Preterm AB  TAB SAB Ectopic Multiple Living            0     Review of Systems  Constitutional: Negative for fever, chills, appetite change and fatigue.  HENT: Negative for congestion, ear discharge and sinus pressure.   Eyes: Negative for discharge.  Respiratory: Positive for cough.   Cardiovascular: Negative for chest pain.  Gastrointestinal: Positive for nausea. Negative for vomiting, abdominal pain and diarrhea.  Genitourinary: Negative for frequency and hematuria.  Musculoskeletal: Negative for back pain and neck pain.  Skin: Negative for rash.  Neurological: Positive for dizziness and headaches. Negative for seizures.  Psychiatric/Behavioral: Negative for hallucinations.      Allergies  Morphine and related  Home Medications   Prior to Admission medications   Medication Sig Start Date End Date Taking? Authorizing Provider  acetaminophen (TYLENOL) 500 MG tablet Take 500 mg by mouth daily as needed for pain.    Historical Provider, MD  atenolol (TENORMIN) 25 MG tablet Take 25 mg by mouth daily.    Historical Provider, MD  atorvastatin (LIPITOR) 40 MG tablet Take 40 mg by mouth daily.    Historical Provider, MD  lisinopril-hydrochlorothiazide (PRINZIDE,ZESTORETIC) 20-25 MG per tablet Take 1 tablet by mouth daily.    Historical Provider, MD  LORazepam (ATIVAN) 1 MG tablet Take 1 tablet by mouth at bedtime as needed for sleep.  04/23/14   Historical Provider,  MD  meclizine (ANTIVERT) 25 MG tablet Take 25 mg by mouth 4 (four) times daily as needed for dizziness.    Historical Provider, MD  metFORMIN (GLUCOPHAGE) 500 MG tablet Take 1,000 mg by mouth 2 (two) times daily with a meal.    Historical Provider, MD  Multiple Vitamin (MULTIVITAMIN WITH MINERALS) TABS Take 1 tablet by mouth daily.    Historical Provider, MD  omeprazole (PRILOSEC) 20 MG capsule Take 20 mg by mouth daily.    Historical Provider, MD  potassium chloride SA (K-DUR,KLOR-CON) 20 MEQ tablet Take 20 mEq by mouth daily.     Historical Provider, MD  vitamin B-12 (CYANOCOBALAMIN) 1000 MCG tablet Take 1,000 mcg by mouth daily.     Historical Provider, MD   BP 170/71 mmHg  Pulse 81  Temp(Src) 99.5 F (37.5 C) (Oral)  Resp 18  Ht 5' 5.5" (1.664 m)  Wt 189 lb (85.73 kg)  BMI 30.96 kg/m2  SpO2 99% Physical Exam  Constitutional: She is oriented to person, place, and time. She appears well-developed.  HENT:  Head: Normocephalic.  Eyes: Conjunctivae and EOM are normal. No scleral icterus.  Neck: Neck supple. No thyromegaly present.  Cardiovascular: Normal rate and regular rhythm.  Exam reveals no gallop and no friction rub.   No murmur heard. Pulmonary/Chest: No stridor. She has no wheezes. She has no rales. She exhibits no tenderness.  Abdominal: She exhibits no distension. There is no tenderness. There is no rebound.  Healed incision to abdomen  Musculoskeletal: Normal range of motion. She exhibits edema (1+ edema to ankles).  Lymphadenopathy:    She has no cervical adenopathy.  Neurological: She is oriented to person, place, and time. She exhibits normal muscle tone. Coordination normal.  Skin: No rash noted. No erythema.  Psychiatric: She has a normal mood and affect. Her behavior is normal.  Nursing note and vitals reviewed.   ED Course  Procedures (including critical care time)  DIAGNOSTIC STUDIES: Oxygen Saturation is 99% on room air, normal by my interpretation.    COORDINATION OF CARE: 9:15 AM Discussed treatment plan with patient at beside, the patient agrees with the plan and has no further questions at this time.   Labs Review Labs Reviewed  CBG MONITORING, ED    Imaging Review No results found.   EKG Interpretation   Date/Time:  Thursday March 06 2015 09:14:31 EST Ventricular Rate:  79 PR Interval:  136 QRS Duration: 94 QT Interval:  363 QTC Calculation: 416 R Axis:   41 Text Interpretation:  Sinus rhythm Baseline wander in lead(s) V3 Confirmed  by Dakiya Puopolo  MD, Chi Garlow  [phone]) on 03/06/2015 1:17:18 PM Also confirmed by  Azlee Monforte  MD, Jomarie Longs [phone])  on 03/06/2015 1:17:39 PM      MDM   Final diagnoses:  None   Headache,  Bronchitis,   tx with zpak, zofran and ultram with follow up next week.  The chart was scribed for me under my direct supervision.  I personally performed the history, physical, and medical decision making and all procedures in the evaluation of this patient.Bethann Berkshire, MD 03/06/15 1318

## 2015-03-06 NOTE — ED Notes (Signed)
MD at bedside. 

## 2015-03-06 NOTE — ED Notes (Signed)
Patient c/o severe headache with dizziness that started yesterday morning. Patient reports nausea. Denies any vomiting, weakness, vision changes (other than feeling like room is spinning), facial drooping, or slurred speech. Per patient "worst headache she has ever had."

## 2015-10-23 ENCOUNTER — Other Ambulatory Visit (HOSPITAL_COMMUNITY): Payer: Self-pay | Admitting: Family Medicine

## 2015-10-23 DIAGNOSIS — Z1231 Encounter for screening mammogram for malignant neoplasm of breast: Secondary | ICD-10-CM

## 2015-12-15 ENCOUNTER — Ambulatory Visit (HOSPITAL_COMMUNITY): Payer: Medicare Other

## 2015-12-24 ENCOUNTER — Ambulatory Visit (HOSPITAL_COMMUNITY)
Admission: RE | Admit: 2015-12-24 | Discharge: 2015-12-24 | Disposition: A | Discharge status: Home / Self Care | Payer: Medicare Other | Source: Ambulatory Visit | Attending: Family Medicine | Admitting: Family Medicine

## 2015-12-24 DIAGNOSIS — Z1231 Encounter for screening mammogram for malignant neoplasm of breast: Secondary | ICD-10-CM

## 2015-12-26 ENCOUNTER — Other Ambulatory Visit: Payer: Self-pay | Admitting: Family Medicine

## 2015-12-26 DIAGNOSIS — R928 Other abnormal and inconclusive findings on diagnostic imaging of breast: Secondary | ICD-10-CM

## 2016-01-13 ENCOUNTER — Other Ambulatory Visit (HOSPITAL_COMMUNITY): Payer: Self-pay | Admitting: Family Medicine

## 2016-01-13 ENCOUNTER — Ambulatory Visit (HOSPITAL_COMMUNITY)
Admission: RE | Admit: 2016-01-13 | Discharge: 2016-01-13 | Disposition: A | Discharge status: Home / Self Care | Payer: Medicare Other | Source: Ambulatory Visit | Attending: Family Medicine | Admitting: Family Medicine

## 2016-01-13 DIAGNOSIS — R928 Other abnormal and inconclusive findings on diagnostic imaging of breast: Secondary | ICD-10-CM

## 2016-03-02 ENCOUNTER — Other Ambulatory Visit (HOSPITAL_COMMUNITY): Payer: Self-pay | Admitting: Internal Medicine

## 2016-03-02 ENCOUNTER — Ambulatory Visit (HOSPITAL_COMMUNITY)
Admission: RE | Admit: 2016-03-02 | Discharge: 2016-03-02 | Disposition: A | Discharge status: Home / Self Care | Payer: Medicare Other | Source: Ambulatory Visit | Attending: Internal Medicine | Admitting: Internal Medicine

## 2016-03-02 DIAGNOSIS — M25561 Pain in right knee: Secondary | ICD-10-CM | POA: Diagnosis not present

## 2016-03-02 DIAGNOSIS — R52 Pain, unspecified: Secondary | ICD-10-CM

## 2016-03-02 DIAGNOSIS — M25562 Pain in left knee: Secondary | ICD-10-CM | POA: Diagnosis not present

## 2016-03-02 DIAGNOSIS — I739 Peripheral vascular disease, unspecified: Secondary | ICD-10-CM | POA: Diagnosis not present

## 2016-10-08 ENCOUNTER — Other Ambulatory Visit (HOSPITAL_COMMUNITY): Payer: Self-pay | Admitting: Nephrology

## 2016-10-08 DIAGNOSIS — N183 Chronic kidney disease, stage 3 unspecified: Secondary | ICD-10-CM

## 2016-10-19 ENCOUNTER — Ambulatory Visit (HOSPITAL_COMMUNITY)
Admission: RE | Admit: 2016-10-19 | Discharge: 2016-10-19 | Disposition: A | Discharge status: Home / Self Care | Payer: Medicare Other | Source: Ambulatory Visit | Attending: Nephrology | Admitting: Nephrology

## 2016-10-19 DIAGNOSIS — N183 Chronic kidney disease, stage 3 unspecified: Secondary | ICD-10-CM

## 2016-10-19 DIAGNOSIS — N281 Cyst of kidney, acquired: Secondary | ICD-10-CM | POA: Diagnosis not present

## 2016-11-25 ENCOUNTER — Other Ambulatory Visit (HOSPITAL_COMMUNITY): Payer: Self-pay | Admitting: Internal Medicine

## 2016-11-25 DIAGNOSIS — Z1231 Encounter for screening mammogram for malignant neoplasm of breast: Secondary | ICD-10-CM

## 2016-12-24 ENCOUNTER — Ambulatory Visit (HOSPITAL_COMMUNITY): Payer: Medicare Other

## 2016-12-31 ENCOUNTER — Ambulatory Visit (HOSPITAL_COMMUNITY)
Admission: RE | Admit: 2016-12-31 | Discharge: 2016-12-31 | Disposition: A | Discharge status: Home / Self Care | Payer: Medicare Other | Source: Ambulatory Visit | Attending: Internal Medicine | Admitting: Internal Medicine

## 2016-12-31 DIAGNOSIS — Z1231 Encounter for screening mammogram for malignant neoplasm of breast: Secondary | ICD-10-CM | POA: Diagnosis not present

## 2017-03-30 ENCOUNTER — Ambulatory Visit (HOSPITAL_COMMUNITY)
Admission: RE | Admit: 2017-03-30 | Discharge: 2017-03-30 | Disposition: A | Discharge status: Home / Self Care | Payer: Medicare Other | Source: Ambulatory Visit | Attending: Internal Medicine | Admitting: Internal Medicine

## 2017-03-30 ENCOUNTER — Other Ambulatory Visit (HOSPITAL_COMMUNITY): Payer: Self-pay | Admitting: Internal Medicine

## 2017-03-30 DIAGNOSIS — I700 Atherosclerosis of aorta: Secondary | ICD-10-CM | POA: Insufficient documentation

## 2017-03-30 DIAGNOSIS — R52 Pain, unspecified: Secondary | ICD-10-CM

## 2017-03-30 DIAGNOSIS — M545 Low back pain: Secondary | ICD-10-CM | POA: Insufficient documentation

## 2017-05-30 ENCOUNTER — Emergency Department (HOSPITAL_COMMUNITY): Payer: Medicare Other

## 2017-05-30 ENCOUNTER — Observation Stay (HOSPITAL_COMMUNITY)
Admission: EM | Admit: 2017-05-30 | Discharge: 2017-05-31 | Disposition: A | Discharge status: Home / Self Care | Payer: Medicare Other | Attending: Internal Medicine | Admitting: Internal Medicine

## 2017-05-30 ENCOUNTER — Encounter (HOSPITAL_COMMUNITY): Payer: Self-pay | Admitting: *Deleted

## 2017-05-30 DIAGNOSIS — I4891 Unspecified atrial fibrillation: Secondary | ICD-10-CM | POA: Diagnosis present

## 2017-05-30 DIAGNOSIS — E1169 Type 2 diabetes mellitus with other specified complication: Secondary | ICD-10-CM | POA: Diagnosis present

## 2017-05-30 DIAGNOSIS — Z7984 Long term (current) use of oral hypoglycemic drugs: Secondary | ICD-10-CM | POA: Diagnosis not present

## 2017-05-30 DIAGNOSIS — Z79899 Other long term (current) drug therapy: Secondary | ICD-10-CM | POA: Diagnosis not present

## 2017-05-30 DIAGNOSIS — E119 Type 2 diabetes mellitus without complications: Secondary | ICD-10-CM | POA: Diagnosis not present

## 2017-05-30 DIAGNOSIS — E785 Hyperlipidemia, unspecified: Secondary | ICD-10-CM | POA: Diagnosis present

## 2017-05-30 DIAGNOSIS — R197 Diarrhea, unspecified: Secondary | ICD-10-CM | POA: Diagnosis not present

## 2017-05-30 DIAGNOSIS — H81399 Other peripheral vertigo, unspecified ear: Secondary | ICD-10-CM | POA: Insufficient documentation

## 2017-05-30 DIAGNOSIS — I4892 Unspecified atrial flutter: Secondary | ICD-10-CM

## 2017-05-30 DIAGNOSIS — R42 Dizziness and giddiness: Secondary | ICD-10-CM

## 2017-05-30 DIAGNOSIS — E669 Obesity, unspecified: Secondary | ICD-10-CM

## 2017-05-30 DIAGNOSIS — R112 Nausea with vomiting, unspecified: Secondary | ICD-10-CM

## 2017-05-30 DIAGNOSIS — I10 Essential (primary) hypertension: Secondary | ICD-10-CM | POA: Insufficient documentation

## 2017-05-30 DIAGNOSIS — E871 Hypo-osmolality and hyponatremia: Secondary | ICD-10-CM | POA: Diagnosis not present

## 2017-05-30 LAB — CBC
HEMATOCRIT: 33.7 % — AB (ref 36.0–46.0)
HEMOGLOBIN: 11.7 g/dL — AB (ref 12.0–15.0)
MCH: 30.5 pg (ref 26.0–34.0)
MCHC: 34.7 g/dL (ref 30.0–36.0)
MCV: 87.8 fL (ref 78.0–100.0)
Platelets: 454 10*3/uL — ABNORMAL HIGH (ref 150–400)
RBC: 3.84 MIL/uL — ABNORMAL LOW (ref 3.87–5.11)
RDW: 12.3 % (ref 11.5–15.5)
WBC: 10.7 10*3/uL — ABNORMAL HIGH (ref 4.0–10.5)

## 2017-05-30 LAB — DIFFERENTIAL
BASOS ABS: 0 10*3/uL (ref 0.0–0.1)
BASOS PCT: 0 %
EOS ABS: 0.1 10*3/uL (ref 0.0–0.7)
Eosinophils Relative: 1 %
Lymphocytes Relative: 30 %
Lymphs Abs: 3.2 10*3/uL (ref 0.7–4.0)
MONO ABS: 0.8 10*3/uL (ref 0.1–1.0)
Monocytes Relative: 7 %
NEUTROS ABS: 6.7 10*3/uL (ref 1.7–7.7)
Neutrophils Relative %: 62 %

## 2017-05-30 LAB — HEPATIC FUNCTION PANEL
ALT: 18 U/L (ref 14–54)
AST: 28 U/L (ref 15–41)
Albumin: 4.4 g/dL (ref 3.5–5.0)
Alkaline Phosphatase: 64 U/L (ref 38–126)
BILIRUBIN DIRECT: 0.2 mg/dL (ref 0.1–0.5)
BILIRUBIN TOTAL: 1 mg/dL (ref 0.3–1.2)
Indirect Bilirubin: 0.8 mg/dL (ref 0.3–0.9)
Total Protein: 7.2 g/dL (ref 6.5–8.1)

## 2017-05-30 LAB — LIPASE, BLOOD: LIPASE: 33 U/L (ref 11–51)

## 2017-05-30 LAB — URINALYSIS, ROUTINE W REFLEX MICROSCOPIC
Bilirubin Urine: NEGATIVE
Glucose, UA: NEGATIVE mg/dL
KETONES UR: NEGATIVE mg/dL
Nitrite: NEGATIVE
PROTEIN: NEGATIVE mg/dL
Specific Gravity, Urine: 1.009 (ref 1.005–1.030)
pH: 5 (ref 5.0–8.0)

## 2017-05-30 LAB — BASIC METABOLIC PANEL
ANION GAP: 13 (ref 5–15)
BUN: 16 mg/dL (ref 6–20)
CHLORIDE: 92 mmol/L — AB (ref 101–111)
CO2: 22 mmol/L (ref 22–32)
Calcium: 9.1 mg/dL (ref 8.9–10.3)
Creatinine, Ser: 1.38 mg/dL — ABNORMAL HIGH (ref 0.44–1.00)
GFR calc Af Amer: 42 mL/min — ABNORMAL LOW (ref >60)
GFR calc non Af Amer: 37 mL/min — ABNORMAL LOW (ref >60)
GLUCOSE: 129 mg/dL — AB (ref 65–99)
POTASSIUM: 4.2 mmol/L (ref 3.5–5.1)
Sodium: 127 mmol/L — ABNORMAL LOW (ref 135–145)

## 2017-05-30 LAB — GLUCOSE, CAPILLARY: GLUCOSE-CAPILLARY: 131 mg/dL — AB (ref 65–99)

## 2017-05-30 LAB — TROPONIN I

## 2017-05-30 MED ORDER — SODIUM CHLORIDE 0.9% FLUSH
3.0000 mL | Freq: Two times a day (BID) | INTRAVENOUS | Status: DC
  Administered 2017-05-30 – 2017-05-31 (×2): 3 mL via INTRAVENOUS

## 2017-05-30 MED ORDER — PANTOPRAZOLE SODIUM 40 MG PO TBEC
40.0000 mg | DELAYED_RELEASE_TABLET | Freq: Every day | ORAL | Status: DC
  Administered 2017-05-30 – 2017-05-31 (×2): 40 mg via ORAL
  Filled 2017-05-30 (×2): qty 1

## 2017-05-30 MED ORDER — PNEUMOCOCCAL VAC POLYVALENT 25 MCG/0.5ML IJ INJ
0.5000 mL | INJECTION | INTRAMUSCULAR | Status: DC
  Filled 2017-05-30: qty 0.5

## 2017-05-30 MED ORDER — METOPROLOL TARTRATE 25 MG PO TABS
25.0000 mg | ORAL_TABLET | Freq: Two times a day (BID) | ORAL | Status: DC
  Administered 2017-05-31 (×2): 25 mg via ORAL
  Filled 2017-05-30 (×2): qty 1

## 2017-05-30 MED ORDER — MECLIZINE HCL 12.5 MG PO TABS: 25.0000 mg | ORAL_TABLET | Freq: Four times a day (QID) | ORAL | Status: DC | PRN

## 2017-05-30 MED ORDER — SODIUM CHLORIDE 0.9 % IV BOLUS (SEPSIS)
500.0000 mL | Freq: Once | INTRAVENOUS | Status: AC
  Administered 2017-05-30: 500 mL via INTRAVENOUS

## 2017-05-30 MED ORDER — ACETAMINOPHEN 325 MG PO TABS: 650.0000 mg | ORAL_TABLET | Freq: Four times a day (QID) | ORAL | Status: DC | PRN

## 2017-05-30 MED ORDER — SODIUM CHLORIDE 0.9 % IV SOLN
INTRAVENOUS | Status: DC
  Administered 2017-05-30 – 2017-05-31 (×4): via INTRAVENOUS

## 2017-05-30 MED ORDER — PROMETHAZINE HCL 25 MG/ML IJ SOLN
12.5000 mg | Freq: Once | INTRAMUSCULAR | Status: DC
  Filled 2017-05-30: qty 1

## 2017-05-30 MED ORDER — ENOXAPARIN SODIUM 60 MG/0.6ML ~~LOC~~ SOLN
60.0000 mg | Freq: Once | SUBCUTANEOUS | Status: AC
  Administered 2017-05-31: 60 mg via SUBCUTANEOUS
  Filled 2017-05-30: qty 0.6

## 2017-05-30 MED ORDER — ACETAMINOPHEN 650 MG RE SUPP: 650.0000 mg | Freq: Four times a day (QID) | RECTAL | Status: DC | PRN

## 2017-05-30 MED ORDER — ENOXAPARIN SODIUM 40 MG/0.4ML ~~LOC~~ SOLN
40.0000 mg | SUBCUTANEOUS | Status: DC
  Administered 2017-05-30: 40 mg via SUBCUTANEOUS
  Filled 2017-05-30: qty 0.4

## 2017-05-30 MED ORDER — ONDANSETRON HCL 4 MG PO TABS: 4.0000 mg | ORAL_TABLET | Freq: Four times a day (QID) | ORAL | Status: DC | PRN

## 2017-05-30 MED ORDER — ATORVASTATIN CALCIUM 40 MG PO TABS: 40.0000 mg | ORAL_TABLET | Freq: Every day | ORAL | Status: DC

## 2017-05-30 MED ORDER — ONDANSETRON HCL 4 MG/2ML IJ SOLN: 4.0000 mg | Freq: Four times a day (QID) | INTRAMUSCULAR | Status: DC | PRN

## 2017-05-30 MED ORDER — INSULIN ASPART 100 UNIT/ML ~~LOC~~ SOLN: 0.0000 [IU] | Freq: Three times a day (TID) | SUBCUTANEOUS | Status: DC

## 2017-05-30 NOTE — ED Notes (Signed)
Pt updated and given warm blanket 

## 2017-05-30 NOTE — H&P (Signed)
History and Physical    Deborah Benjamin:[id number] DOB: [birth year] DOA: 05/30/2017  PCP: Pearson Grippe, MD Consultants:  Leonette Most - nephrology Patient coming from: Home - lives alone; [facility]: sister, [phone]  Chief Complaint: dizziness  HPI: Deborah Benjamin is a 76 y.o. female with medical history significant of HTN, HLD, DM, and vertigo presenting with dizziness.  She reports, "I thought it was vertigo but come to find out it was afib."  N/V/D and vertigo.  Symptoms started 2 weeks ago.  Started spontaneously.  Laid back and the world was spinning and since then she is dizzy standing up too.  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  Feeling very weak, hard to talk.  She is not dizzy all the time - just with lying flat or when bending over and then standing up.  No chest pain or palptations.  She was supposed to go to PCP today for blood work and to check urine for infection, but she was too sick to go and came here instead.  She has not vomited today, and has been able to eat a regular diet yesterday and today.   ED Course: New afib on EKG.  New hyponatremia on labs.  IV NS and Phenergan given.    Review of Systems: As per HPI; otherwise review of systems reviewed and negative.   Ambulatory Status:  Ambulates with a cane  Past Medical History:  Diagnosis Date  . Diabetes mellitus without complication (HCC)   . High cholesterol   . Hypertension   . Vertigo     Past Surgical History:  Procedure Laterality Date  . ABDOMINAL HYSTERECTOMY    . CHOLECYSTECTOMY    . CYST EXCISION      Social History   Social History  . Marital status: Divorced    Spouse name: N/A  . Number of children: N/A  . Years of education: N/A   Occupational History  . retired    Social History Main Topics  . Smoking status: Never Smoker  . Smokeless tobacco: Never Used  . Alcohol use No  . Drug use: No    . Sexual activity: Not on file   Other Topics Concern  . Not on file   Social History Narrative  . No narrative on file    No Active Allergies  Family History  Problem Relation Age of Onset  . Diabetes Sister     Prior to Admission medications   Medication Sig Start Date End Date Taking? Authorizing Provider  acetaminophen (TYLENOL) 500 MG tablet Take 500 mg by mouth daily as needed for pain.   Yes [provider]  atenolol (TENORMIN) 25 MG tablet Take 25 mg by mouth daily.   Yes [provider]  atorvastatin (LIPITOR) 40 MG tablet Take 40 mg by mouth every morning.    Yes [provider]  lisinopril-hydrochlorothiazide (PRINZIDE,ZESTORETIC) 20-12.5 MG per tablet Take 1 tablet by mouth daily.   Yes [provider]  meclizine (ANTIVERT) 25 MG tablet Take 25 mg by mouth 4 (four) times daily as needed for dizziness.   Yes [provider]  metFORMIN (GLUCOPHAGE-XR) 500 MG 24 hr tablet Take 1 tablet by mouth 2 (two) times daily. 03/16/17  Yes [provider]  Multiple Vitamin (MULTIVITAMIN WITH MINERALS) TABS Take 1 tablet by mouth daily.   Yes [provider]  omeprazole (PRILOSEC) 20 MG capsule Take 20 mg by  mouth daily.   Yes [provider]  ondansetron (ZOFRAN) 4 MG tablet Take 4 mg by mouth 3 (three) times daily.   Yes [provider]  vitamin B-12 (CYANOCOBALAMIN) 1000 MCG tablet Take 1,000 mcg by mouth daily.    Yes [provider]    Physical Exam: Vitals:   05/30/17 1730 05/30/17 1835 05/30/17 1930 05/30/17 2030  BP: (!) 142/73 140/89 131/60 (!) 148/69  Pulse:  97 99 (!) 115  Resp:  16 18 18   Temp:    97.7 F (36.5 C)  TempSrc:    Oral  SpO2:  99% 99% 100%  Weight:    100.1 kg (220 lb 11.2 oz)  Height:    5\' 5"  (1.651 m)     General:  Appears calm and comfortable and is NAD Eyes:  PERRL, EOMI, normal lids, iris ENT:  grossly normal hearing, lips & tongue, mmm Neck:  no LAD,  masses or thyromegaly Cardiovascular:  Irregularly irregular rate/rhythm, no m/r/g. No LE edema.  Respiratory:  CTA bilaterally, no w/r/r. Normal respiratory effort. Abdomen:  soft, ntnd, NABS Skin:  no rash or induration seen on limited exam Musculoskeletal:  grossly normal tone BUE/BLE, good ROM, no bony abnormality Psychiatric:  grossly normal mood and affect, speech fluent and appropriate, AOx3 Neurologic:  CN 2-12 grossly intact, moves all extremities in coordinated fashion, sensation intact  Labs on Admission: I have personally reviewed following labs and imaging studies  CBC:  Recent Labs Lab 05/30/17 1403 05/30/17 1627  WBC 10.7*  --   NEUTROABS  --  6.7  HGB 11.7*  --   HCT 33.7*  --   MCV 87.8  --   PLT 454*  --    Basic Metabolic Panel:  Recent Labs Lab 05/30/17 1403  NA 127*  K 4.2  CL 92*  CO2 22  GLUCOSE 129*  BUN 16  CREATININE 1.38*  CALCIUM 9.1   GFR: Estimated Creatinine Clearance: 41.9 mL/min (A) (by C-G formula based on SCr of 1.38 mg/dL (H)). Liver Function Tests:  Recent Labs Lab 05/30/17 1627  AST 28  ALT 18  ALKPHOS 64  BILITOT 1.0  PROT 7.2  ALBUMIN 4.4    Recent Labs Lab 05/30/17 1627  LIPASE 33   No results for input(s): AMMONIA in the last 168 hours. Coagulation Profile: No results for input(s): INR, PROTIME in the last 168 hours. Cardiac Enzymes:  Recent Labs Lab 05/30/17 1627  TROPONINI <0.03   BNP (last 3 results) No results for input(s): PROBNP in the last 8760 hours. HbA1C: No results for input(s): HGBA1C in the last 72 hours. CBG:  Recent Labs Lab 05/30/17 2104  GLUCAP 131*   Lipid Profile: No results for input(s): CHOL, HDL, LDLCALC, TRIG, CHOLHDL, LDLDIRECT in the last 72 hours. Thyroid Function Tests: No results for input(s): TSH, T4TOTAL, FREET4, T3FREE, THYROIDAB in the last 72 hours. Anemia Panel: No results for input(s): VITAMINB12, FOLATE, FERRITIN, TIBC, IRON, RETICCTPCT in the last 72  hours. Urine analysis:    Component Value Date/Time   COLORURINE YELLOW 05/30/2017 1354   APPEARANCEUR HAZY (A) 05/30/2017 1354   LABSPEC 1.009 05/30/2017 1354   PHURINE 5.0 05/30/2017 1354   GLUCOSEU NEGATIVE 05/30/2017 1354   HGBUR SMALL (A) 05/30/2017 1354   BILIRUBINUR NEGATIVE 05/30/2017 1354   KETONESUR NEGATIVE 05/30/2017 1354   PROTEINUR NEGATIVE 05/30/2017 1354   UROBILINOGEN 0.2 03/06/2015 1213   NITRITE NEGATIVE 05/30/2017 1354   LEUKOCYTESUR MODERATE (A) 05/30/2017 1354  Creatinine Clearance: Estimated Creatinine Clearance: 41.9 mL/min (A) (by C-G formula based on SCr of 1.38 mg/dL (H)).  Sepsis Labs: @LABRCNTIP (procalcitonin:4,lacticidven:4) )No results found for this or any previous visit (from the past 240 hour(s)).   Radiological Exams on Admission: Ct Head Wo Contrast  Result Date: 05/30/2017 CLINICAL DATA:  Dizziness for 2 weeks. EXAM: CT HEAD WITHOUT CONTRAST TECHNIQUE: Contiguous axial images were obtained from the base of the skull through the vertex without intravenous contrast. COMPARISON:  03/06/2015 FINDINGS: Brain: No evidence of acute infarction, hemorrhage, hydrocephalus, extra-axial collection or mass lesion/mass effect. Mild periventricular microangiopathy. Vascular: Calcific atherosclerotic disease at the skullbase. Skull: Normal. Negative for fracture or focal lesion. Sinuses/Orbits: No acute finding. Other: None. IMPRESSION: No acute intracranial abnormality. Mild chronic microvascular disease. Electronically Signed   By: Ted Mcalpineobrinka  Dimitrova M.D.   On: 05/30/2017 17:13   Dg Abd Acute W/chest  Result Date: 05/30/2017 CLINICAL DATA:  Nausea, vomiting, and diarrhea for 2 weeks. Vertigo. EXAM: DG ABDOMEN ACUTE W/ 1V CHEST COMPARISON:  Chest x-ray dated 03/06/2015 and radiograph dated 03/30/2017 FINDINGS: There is no evidence of dilated bowel loops or free intraperitoneal air. No radiopaque calculi or other significant radiographic abnormality is seen.  Heart size and mediastinal contours are within normal limits. Both lungs are clear. Aortic atherosclerosis. IMPRESSION: Negative abdominal radiographs.  No acute cardiopulmonary disease. Aortic atherosclerosis. Electronically Signed   By: Francene BoyersJames  Maxwell M.D.   On: 05/30/2017 17:45    EKG: Independently reviewed.  Afib, rate 103  Assessment/Plan Principal Problem:   Hyponatremia Active Problems:   Atrial fibrillation and flutter (HCC)   Essential hypertension   Vertigo   Diabetes mellitus type 2 in obese (HCC)   Hyperlipidemia   Hyponatremia -Sodium 127 -BUN 22/Creatinine 1.29/GFR 37; 16/1.02/54 in 3/16 -UA: rare bacteria, small Hgb, moderate LE, negative nitrites, 6-30 WBC -Etiology appears to be hypovolemic hyponatremia -This likely evolved as a result of inadequate PO intake resulting from n/v that resulted from vertigo -She does have apparent very mild AKI -Will not check additional urine studies at this time -Since this is fairly mild hyponatremia, there is little risk of overcorrecting too rapidly -Will recheck BMP in AM  -If it has normalized, she may be appropriate for discharge from this standpoint  Afib -Patient presenting with new-onset afib. Etiology is thought to be related to AKI/hyponatremia.  -Since the afib onset is unknown, will focus on rate control and searching for the underlying cause at this time. -She is generally rate controlled and so a Diltiazem bolus and/or infusion does not appear to be indicated at this time. -She has been taking Atenolol 25 mg daily; will change to Lopressor 25 mg BID. -Will request Echocardiogram for further evaluation and also order TSH/free T4.   -Will also risk stratify with FLP and HgbA1c.   -Will repeat EKG in AM and consider consulting cardiology at that time. -CHA2DS2-VASc Score is >2 and so patient will need oral anticoagulation. -Will start with treatment-dose Lovenox for now and defer oral AC treatment to day team tomorrow;  she may need CM assistance in determining the most cost-effective and reasonable choice for her financial situation.  Given how overwhelming the situation was for her tonight, I did not begin this discussion.  HTN -As noted above, will change Atenolol to Lopressor; if effective, consider changing to Toprol XL for ease in dosing. -Stop HCTZ due to hyponatremia. -Hold Lisinopril due to mildly elevated creatinine; consider restarting tomorrow.  Vertigo -May be simply chronic vertigo or may  be related to afib and/or hyponatremia. -Negative head CT. -Continue Meclizine.  DM -Glucose 129, 131 -Hold metformin. -Check A1c. -Cover with SSI.  HLD -Continue Lipitor. -Check FLP.   DVT prophylaxis: Treatment-dose Lovenox  Code Status: DNR - confirmed with patient/family Family Communication: Sister present throughout evaluation  Disposition Plan:  Home once clinically improved Consults called: None  Admission status: It is my clinical opinion that referral for OBSERVATION is reasonable and necessary in this patient based on the above information provided. The aforementioned taken together are felt to place the patient at high risk for further clinical deterioration. However it is anticipated that the patient may be medically stable for discharge from the hospital within 24 to 48 hours.    Jonah Blue MD [facility]  If 7PM-7AM, please contact night-coverage [email or web address] Password [email or web address]  05/30/2017, 11:03 PM

## 2017-05-30 NOTE — ED Provider Notes (Signed)
[facility]Y DEPT Provider Note   CSN: [id number] Arrival date & time: 05/30/17  1338     History   Chief Complaint Chief Complaint  Patient presents with  . Dizziness    HPI Deborah Benjamin is a 76 y.o. female.  HPI  Pt was seen at 1615. Per pt, c/o gradual onset and persistence of multiple intermittent episodes of N/V/D for the past 2 weeks. 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 Pt states she has been taking her antivert without improvement. Pt states she has been unable to walk due to her symptoms, and family is concerned because pt lives alone. Pt was evaluated by her PMD 2 days ago, rx zofran. States she "couldn't go back to my doctor today because I felt worse."  Denies CP/palpitations, no SOB/cough, no abd pain, no CP/SOB, no back pain, no fevers, no black or blood in stools or emesis.    Past Medical History:  Diagnosis Date  . Diabetes mellitus without complication (HCC)   . High cholesterol   . Hypertension   . Vertigo     There are no active problems to display for this patient.   Past Surgical History:  Procedure Laterality Date  . ABDOMINAL HYSTERECTOMY    . CHOLECYSTECTOMY    . CYST EXCISION      OB History    Gravida Para Term Preterm AB Living             0   SAB TAB Ectopic Multiple Live Births                   Home Medications    Prior to Admission medications   Medication Sig Start Date End Date Taking? Authorizing Provider  acetaminophen (TYLENOL) 500 MG tablet Take 500 mg by mouth daily as needed for pain.    [provider]  atenolol (TENORMIN) 25 MG tablet Take 25 mg by mouth daily.    [provider]  atorvastatin (LIPITOR) 40 MG tablet Take 40 mg by mouth daily.    [provider]  levofloxacin (LEVAQUIN) 500 MG tablet Take 1 tablet (500 mg total) by mouth daily. 03/06/15   Bethann Berkshire, MD  lisinopril-hydrochlorothiazide (PRINZIDE,ZESTORETIC) 20-12.5 MG per tablet Take 1 tablet by  mouth daily.    [provider]  LORazepam (ATIVAN) 1 MG tablet Take 1 tablet by mouth at bedtime as needed for sleep.  04/23/14   [provider]  meclizine (ANTIVERT) 25 MG tablet Take 25 mg by mouth 4 (four) times daily as needed for dizziness.    [provider]  metFORMIN (GLUCOPHAGE) 500 MG tablet Take 500 mg by mouth 2 (two) times daily with a meal.     [provider]  Multiple Vitamin (MULTIVITAMIN WITH MINERALS) TABS Take 1 tablet by mouth daily.    [provider]  omeprazole (PRILOSEC) 20 MG capsule Take 20 mg by mouth daily.    [provider]  ondansetron (ZOFRAN ODT) 4 MG disintegrating tablet 4mg  ODT q4 hours prn nausea/vomit 03/06/15   Bethann Berkshire, MD  traMADol (ULTRAM) 50 MG tablet Take 1 tablet (50 mg total) by mouth every 6 (six) hours as needed. 03/06/15   Bethann Berkshire, MD  vitamin B-12 (CYANOCOBALAMIN) 1000 MCG tablet Take 1,000 mcg by mouth daily.     [provider]    Family History Family History  Problem Relation Age of Onset  . Diabetes Sister     Social History Social  History  Substance Use Topics  . Smoking status: Never Smoker  . Smokeless tobacco: Never Used  . Alcohol use No     Allergies   Morphine and related   Review of Systems Review of Systems ROS: Statement: All systems negative except as marked or noted in the HPI; Constitutional: Negative for fever and chills. +weakness.; ; Eyes: Negative for eye pain, redness and discharge. ; ; ENMT: Negative for ear pain, hoarseness, nasal congestion, sinus pressure and sore throat. ; ; Cardiovascular: Negative for chest pain, palpitations, diaphoresis, dyspnea and peripheral edema. ; ; Respiratory: Negative for cough, wheezing and stridor. ; ; Gastrointestinal: +N/V/D. Negative for abdominal pain, blood in stool, hematemesis, jaundice and rectal bleeding. . ; ; Genitourinary: Negative for dysuria, flank pain and hematuria. ; ; Musculoskeletal:  Negative for back pain and neck pain. Negative for swelling and trauma.; ; Skin: Negative for pruritus, rash, abrasions, blisters, bruising and skin lesion.; ; Neuro: +vertigo.  Negative for headache, lightheadedness and neck stiffness. Negative for altered level of consciousness, altered mental status, extremity weakness, paresthesias, involuntary movement, seizure and syncope.     Physical Exam Updated Vital Signs BP (!) 145/71 (BP Location: Right Arm)   Pulse 83   Temp 98.4 F (36.9 C) (Oral)   Resp 18   Wt 93.4 kg (206 lb)   SpO2 99%   BMI 33.76 kg/m   16:37:58 Orthostatic Vital Signs CA  Orthostatic Lying   BP- Lying: 163/70  Pulse- Lying: 102      Orthostatic Sitting  BP- Sitting: 151/74  Pulse- Sitting: 97      Orthostatic Standing at 0 minutes  BP- Standing at 0 minutes:  152/98  Pulse- Standing at 0 minutes: 103     Physical Exam 1620: Physical examination:  Nursing notes reviewed; Vital signs and O2 SAT reviewed;  Constitutional: Well developed, Well nourished, In no acute distress; Head:  Normocephalic, atraumatic; Eyes: EOMI, PERRL, No scleral icterus; ENMT: TM's clear bilat. +edemetous nasal turbinates bilat with clear rhinorrhea. Mouth and pharynx normal, Mucous membranes dry; Neck: Supple, Full range of motion, No lymphadenopathy; Cardiovascular: Irregular rate and rhythm, No gallop; Respiratory: Breath sounds clear & equal bilaterally, No wheezes.  Speaking full sentences with ease, Normal respiratory effort/excursion; Chest: Nontender, Movement normal; Abdomen: Soft, Nontender, Nondistended, Normal bowel sounds; Genitourinary: No CVA tenderness; Extremities: Pulses normal, No tenderness, +2 pedal edema bilat. No calf asymmetry.; Neuro: AA&Ox3, Major CN grossly intact. Speech clear.  No facial droop.  +left horizontal end gaze fatigable nystagmus which reproduces pt's symptoms. Grips equal. Strength 5/5 equal bilat UE's and LE's.  DTR 2/4 equal bilat UE's and LE's.   No gross sensory deficits.  Normal cerebellar testing bilat UE's (finger-nose) and LE's (heel-shin)..; Skin: Color normal, Warm, Dry.    ED Treatments / Results  Labs (all labs ordered are listed, but only abnormal results are displayed)   EKG  EKG Interpretation  Date/Time:  Monday May 30 2017 13:55:11 EDT Ventricular Rate:  106 PR Interval:    QRS Duration: 82 QT Interval:  282 QTC Calculation: 374 R Axis:   5 Text Interpretation:  Atrial fibrillation with rapid ventricular response When compared with ECG of 03/06/2015 Atrial fibrillation has replaced Normal sinus rhythm Confirmed by [facility]  MD, Lillieanna Tuohy ([id number]) on 05/30/2017 4:31:09 PM        EKG Interpretation  Date/Time:  Monday May 30 2017 16:38:17 EDT Ventricular Rate:  103 PR Interval:    QRS Duration: 97 QT Interval:  335  QTC Calculation: 439 R Axis:   48 Text Interpretation:  Atrial fibrillation Since last tracing of earlier today No significant change was found Confirmed by [facility]  [facility], Nicholos JohnsKATHLEEN [id number]) on 05/30/2017 4:54:29 PM         Radiology   Procedures Procedures (including critical care time)  Medications Ordered in ED Medications - No data to display   Initial Impression / Assessment and Plan / ED Course  I have reviewed the triage vital signs and the nursing notes.  Pertinent labs & imaging results that were available during my care of the patient were reviewed by me and considered in my medical decision making (see chart for details).  MDM Reviewed: previous chart, nursing note and vitals Reviewed previous: labs and ECG Interpretation: labs, ECG, x-ray and CT scan Total time providing critical care: 30-74 minutes. This excludes time spent performing separately reportable procedures and services. Consults: admitting MD   CRITICAL CARE Performed by: Laray AngerMCMANUS,Ashey Tramontana M Total critical care time: 35 minutes Critical care time was exclusive of separately billable procedures and treating  other patients. Critical care was necessary to treat or prevent imminent or life-threatening deterioration. Critical care was time spent personally by me on the following activities: development of treatment plan with patient and/or surrogate as well as nursing, discussions with consultants, evaluation of patient's response to treatment, examination of patient, obtaining history from patient or surrogate, ordering and performing treatments and interventions, ordering and review of laboratory studies, ordering and review of radiographic studies, pulse oximetry and re-evaluation of patient's condition.   Results for orders placed or performed during the hospital encounter of 05/30/17  Basic metabolic panel  Result Value Ref Range   Sodium 127 (L) 135 - 145 mmol/L   Potassium 4.2 3.5 - 5.1 mmol/L   Chloride 92 (L) 101 - 111 mmol/L   CO2 22 22 - 32 mmol/L   Glucose, Bld 129 (H) 65 - 99 mg/dL   BUN 16 6 - 20 mg/dL   Creatinine, Ser 1.191.38 (H) 0.44 - 1.00 mg/dL   Calcium 9.1 8.9 - 14.710.3 mg/dL   GFR calc non Af Amer 37 (L) >60 mL/min   GFR calc Af Amer 42 (L) >60 mL/min   Anion gap 13 5 - 15  CBC  Result Value Ref Range   WBC 10.7 (H) 4.0 - 10.5 K/uL   RBC 3.84 (L) 3.87 - 5.11 MIL/uL   Hemoglobin 11.7 (L) 12.0 - 15.0 g/dL   HCT 82.933.7 (L) 56.236.0 - 13.046.0 %   MCV 87.8 78.0 - 100.0 fL   MCH 30.5 26.0 - 34.0 pg   MCHC 34.7 30.0 - 36.0 g/dL   RDW 86.512.3 78.411.5 - 69.615.5 %   Platelets 454 (H) 150 - 400 K/uL  Urinalysis, Routine w reflex microscopic  Result Value Ref Range   Color, Urine YELLOW YELLOW   APPearance HAZY (A) CLEAR   Specific Gravity, Urine 1.009 1.005 - 1.030   pH 5.0 5.0 - 8.0   Glucose, UA NEGATIVE NEGATIVE mg/dL   Hgb urine dipstick SMALL (A) NEGATIVE   Bilirubin Urine NEGATIVE NEGATIVE   Ketones, ur NEGATIVE NEGATIVE mg/dL   Protein, ur NEGATIVE NEGATIVE mg/dL   Nitrite NEGATIVE NEGATIVE   Leukocytes, UA MODERATE (A) NEGATIVE   RBC / HPF 0-5 0 - 5 RBC/hpf   WBC, UA 6-30 0 - 5  WBC/hpf   Bacteria, UA RARE (A) NONE SEEN   Squamous Epithelial / LPF 0-5 (A) NONE SEEN   Hyaline Casts, UA PRESENT  Troponin I  Result Value Ref Range   Troponin I <0.03 <0.03 ng/mL  Differential  Result Value Ref Range   Neutrophils Relative % 62 %   Neutro Abs 6.7 1.7 - 7.7 K/uL   Lymphocytes Relative 30 %   Lymphs Abs 3.2 0.7 - 4.0 K/uL   Monocytes Relative 7 %   Monocytes Absolute 0.8 0.1 - 1.0 K/uL   Eosinophils Relative 1 %   Eosinophils Absolute 0.1 0.0 - 0.7 K/uL   Basophils Relative 0 %   Basophils Absolute 0.0 0.0 - 0.1 K/uL  Lipase, blood  Result Value Ref Range   Lipase 33 11 - 51 U/L  Hepatic function panel  Result Value Ref Range   Total Protein 7.2 6.5 - 8.1 g/dL   Albumin 4.4 3.5 - 5.0 g/dL   AST 28 15 - 41 U/L   ALT 18 14 - 54 U/L   Alkaline Phosphatase 64 38 - 126 U/L   Total Bilirubin 1.0 0.3 - 1.2 mg/dL   Bilirubin, Direct 0.2 0.1 - 0.5 mg/dL   Indirect Bilirubin 0.8 0.3 - 0.9 mg/dL   Ct Head Wo Contrast Result Date: 05/30/2017 CLINICAL DATA:  Dizziness for 2 weeks. EXAM: CT HEAD WITHOUT CONTRAST TECHNIQUE: Contiguous axial images were obtained from the base of the skull through the vertex without intravenous contrast. COMPARISON:  03/06/2015 FINDINGS: Brain: No evidence of acute infarction, hemorrhage, hydrocephalus, extra-axial collection or mass lesion/mass effect. Mild periventricular microangiopathy. Vascular: Calcific atherosclerotic disease at the skullbase. Skull: Normal. Negative for fracture or focal lesion. Sinuses/Orbits: No acute finding. Other: None. IMPRESSION: No acute intracranial abnormality. Mild chronic microvascular disease. Electronically Signed   By: Ted Mcalpine M.D.   On: 05/30/2017 17:13   Dg Abd Acute W/chest Result Date: 05/30/2017 CLINICAL DATA:  Nausea, vomiting, and diarrhea for 2 weeks. Vertigo. EXAM: DG ABDOMEN ACUTE W/ 1V CHEST COMPARISON:  Chest x-ray dated 03/06/2015 and radiograph dated 03/30/2017 FINDINGS: There is  no evidence of dilated bowel loops or free intraperitoneal air. No radiopaque calculi or other significant radiographic abnormality is seen. Heart size and mediastinal contours are within normal limits. Both lungs are clear. Aortic atherosclerosis. IMPRESSION: Negative abdominal radiographs.  No acute cardiopulmonary disease. Aortic atherosclerosis. Electronically Signed   By: Francene Boyers M.D.   On: 05/30/2017 17:45    1830:  New Afib on EKG. New hyponatremia on labs. IV NS and IV phenergan given. No N/V or stooling while in the ED. Neuro exam unchanged. No orthostasis on VS. Dx and testing d/w pt and family.  Questions answered.  Verb understanding, agreeable to admit.  T/C to [facility] Dr. Ophelia Charter, case discussed, including:  HPI, pertinent PM/SHx, VS/PE, dx testing, ED course and treatment:  Agreeable to admit.   Final Clinical Impressions(s) / ED Diagnoses   Final diagnoses:  None    New Prescriptions New Prescriptions   No medications on file     Samuel Jester, DO 06/03/17 1252

## 2017-05-30 NOTE — Progress Notes (Signed)
ANTICOAGULATION CONSULT NOTE - Preliminary  Pharmacy Consult for enoxaparin Indication: atrial fibrillation  No Active Allergies  Patient Measurements: Height: 5\' 5"  (165.1 cm) Weight: 220 lb 11.2 oz (100.1 kg) IBW/kg (Calculated) : 57 HEPARIN DW (KG): 79.9   Vital Signs: Temp: 97.7 F (36.5 C) (06/04 2030) Temp Source: Oral (06/04 2030) BP: 148/69 (06/04 2030) Pulse Rate: 115 (06/04 2030)  Labs:  Recent Labs  05/30/17 1403 05/30/17 1627  HGB 11.7*  --   HCT 33.7*  --   PLT 454*  --   CREATININE 1.38*  --   TROPONINI  --  <0.03   Estimated Creatinine Clearance: 41.9 mL/min (A) (by C-G formula based on SCr of 1.38 mg/dL (H)).  Medical History: Past Medical History:  Diagnosis Date  . Diabetes mellitus without complication (HCC)   . High cholesterol   . Hypertension   . Vertigo     Medications:  Scheduled:  . [START ON 05/31/2017] atorvastatin  40 mg Oral q1800  . enoxaparin (LOVENOX) injection  60 mg Subcutaneous Once  . [START ON 05/31/2017] insulin aspart  0-15 Units Subcutaneous TID WC  . metoprolol tartrate  25 mg Oral BID  . pantoprazole  40 mg Oral Daily  . [START ON 05/31/2017] pneumococcal 23 valent vaccine  0.5 mL Intramuscular Tomorrow-1000  . sodium chloride flush  3 mL Intravenous Q12H   Infusions:  . sodium chloride 100 mL/hr at 05/30/17 2104   PRN: acetaminophen **OR** acetaminophen, meclizine, ondansetron **OR** ondansetron (ZOFRAN) IV Anti-infectives    None      Assessment: 76 yo female to ED c/o n/v/d and vertigo.  A. Fib on EKG. Starting enoxaparin.  Received 40mg  initially in ED, will give an additional 60mg  now for 1mg /kg dose.    Plan:  Lovenox 60mg  now for total of 1mg /kg first dose.  Preliminary review of pertinent patient information completed.  Jeani HawkingAnnie Penn clinical pharmacist will complete review during morning rounds to assess the patient and finalize treatment regimen.  Loyola MastMidkiff, Jedrek Dinovo Scarlett, RPH 05/30/2017,11:35 PM

## 2017-05-30 NOTE — ED Triage Notes (Addendum)
Pt has had dizziness for 2 weeks. Pt went to the doctor on Saturday and was given dizziness medication. States this isn't working She was suppose to follow up today and didn't feel like she could make it to the doctor. Pt is alert and oriented in triage.

## 2017-05-31 ENCOUNTER — Observation Stay (HOSPITAL_BASED_OUTPATIENT_CLINIC_OR_DEPARTMENT_OTHER): Payer: Medicare Other

## 2017-05-31 DIAGNOSIS — E1169 Type 2 diabetes mellitus with other specified complication: Secondary | ICD-10-CM | POA: Diagnosis not present

## 2017-05-31 DIAGNOSIS — I340 Nonrheumatic mitral (valve) insufficiency: Secondary | ICD-10-CM

## 2017-05-31 DIAGNOSIS — I4891 Unspecified atrial fibrillation: Secondary | ICD-10-CM

## 2017-05-31 DIAGNOSIS — E871 Hypo-osmolality and hyponatremia: Secondary | ICD-10-CM

## 2017-05-31 DIAGNOSIS — I10 Essential (primary) hypertension: Secondary | ICD-10-CM

## 2017-05-31 DIAGNOSIS — I4892 Unspecified atrial flutter: Secondary | ICD-10-CM

## 2017-05-31 DIAGNOSIS — E669 Obesity, unspecified: Secondary | ICD-10-CM

## 2017-05-31 DIAGNOSIS — E785 Hyperlipidemia, unspecified: Secondary | ICD-10-CM

## 2017-05-31 LAB — ECHOCARDIOGRAM COMPLETE
Height: 65 in
WEIGHTICAEL: 3531.2 [oz_av]

## 2017-05-31 LAB — GLUCOSE, CAPILLARY
GLUCOSE-CAPILLARY: 132 mg/dL — AB (ref 65–99)
Glucose-Capillary: 147 mg/dL — ABNORMAL HIGH (ref 65–99)

## 2017-05-31 LAB — CBC
HEMATOCRIT: 30.6 % — AB (ref 36.0–46.0)
HEMOGLOBIN: 10.6 g/dL — AB (ref 12.0–15.0)
MCH: 30.5 pg (ref 26.0–34.0)
MCHC: 34.6 g/dL (ref 30.0–36.0)
MCV: 88.2 fL (ref 78.0–100.0)
Platelets: 378 10*3/uL (ref 150–400)
RBC: 3.47 MIL/uL — AB (ref 3.87–5.11)
RDW: 12.3 % (ref 11.5–15.5)
WBC: 8.1 10*3/uL (ref 4.0–10.5)

## 2017-05-31 LAB — BASIC METABOLIC PANEL
ANION GAP: 10 (ref 5–15)
BUN: 14 mg/dL (ref 6–20)
CO2: 22 mmol/L (ref 22–32)
Calcium: 8.7 mg/dL — ABNORMAL LOW (ref 8.9–10.3)
Chloride: 99 mmol/L — ABNORMAL LOW (ref 101–111)
Creatinine, Ser: 1.1 mg/dL — ABNORMAL HIGH (ref 0.44–1.00)
GFR calc non Af Amer: 48 mL/min — ABNORMAL LOW (ref >60)
GFR, EST AFRICAN AMERICAN: 56 mL/min — AB (ref >60)
Glucose, Bld: 136 mg/dL — ABNORMAL HIGH (ref 65–99)
POTASSIUM: 4.1 mmol/L (ref 3.5–5.1)
SODIUM: 131 mmol/L — AB (ref 135–145)

## 2017-05-31 LAB — LIPID PANEL
CHOL/HDL RATIO: 2.1 ratio
CHOLESTEROL: 148 mg/dL (ref 0–200)
HDL: 71 mg/dL (ref >40)
LDL Cholesterol: 62 mg/dL (ref 0–99)
Triglycerides: 77 mg/dL (ref <150)
VLDL: 15 mg/dL (ref 0–40)

## 2017-05-31 LAB — T4, FREE: FREE T4: 1.58 ng/dL — AB (ref 0.61–1.12)

## 2017-05-31 LAB — TSH: TSH: 1.53 u[IU]/mL (ref 0.350–4.500)

## 2017-05-31 MED ORDER — ENOXAPARIN SODIUM 100 MG/ML ~~LOC~~ SOLN
100.0000 mg | Freq: Two times a day (BID) | SUBCUTANEOUS | Status: DC
  Administered 2017-05-31: 100 mg via SUBCUTANEOUS
  Filled 2017-05-31: qty 1

## 2017-05-31 MED ORDER — RIVAROXABAN 20 MG PO TABS: 20.0000 mg | ORAL_TABLET | Freq: Every day | ORAL | 1 refills | Status: DC

## 2017-05-31 MED ORDER — METOPROLOL TARTRATE 37.5 MG PO TABS
37.5000 mg | ORAL_TABLET | Freq: Two times a day (BID) | ORAL | 0 refills | Status: DC
Start: 2017-05-31 — End: 2017-07-11

## 2017-05-31 MED ORDER — APIXABAN 5 MG PO TABS: 5.0000 mg | ORAL_TABLET | Freq: Two times a day (BID) | ORAL | 1 refills | Status: AC

## 2017-05-31 MED ORDER — LISINOPRIL 20 MG PO TABS: 20.0000 mg | ORAL_TABLET | Freq: Every day | ORAL | 0 refills | Status: DC

## 2017-05-31 NOTE — Discharge Instructions (Signed)

## 2017-05-31 NOTE — Progress Notes (Signed)
ANTICOAGULATION CONSULT NOTE  Pharmacy Consult for enoxaparin Indication: atrial fibrillation  No Active Allergies  Patient Measurements: Height: 5\' 5"  (165.1 cm) Weight: 220 lb 11.2 oz (100.1 kg) IBW/kg (Calculated) : 57 HEPARIN DW (KG): 79.9  Vital Signs: Temp: 98.4 F (36.9 C) (06/05 0538) Temp Source: Oral (06/05 0538) BP: 144/66 (06/05 0538) Pulse Rate: 89 (06/05 0538)  Labs:  Recent Labs  05/30/17 1403 05/30/17 1627 05/31/17 0512  HGB 11.7*  --  10.6*  HCT 33.7*  --  30.6*  PLT 454*  --  378  CREATININE 1.38*  --  1.10*  TROPONINI  --  <0.03  --    Estimated Creatinine Clearance: 52.6 mL/min (A) (by C-G formula based on SCr of 1.1 mg/dL (H)).  Medical History: Past Medical History:  Diagnosis Date  . Diabetes mellitus without complication (HCC)   . High cholesterol   . Hypertension   . Vertigo     Medications:  Scheduled:  . atorvastatin  40 mg Oral q1800  . enoxaparin (LOVENOX) injection  100 mg Subcutaneous Q12H  . insulin aspart  0-15 Units Subcutaneous TID WC  . metoprolol tartrate  25 mg Oral BID  . pantoprazole  40 mg Oral Daily  . pneumococcal 23 valent vaccine  0.5 mL Intramuscular Tomorrow-1000  . sodium chloride flush  3 mL Intravenous Q12H   Infusions:  . sodium chloride 100 mL/hr at 05/31/17 0007   PRN: acetaminophen **OR** acetaminophen, meclizine, ondansetron **OR** ondansetron (ZOFRAN) IV Anti-infectives    None     Assessment: 76 yo female to ED c/o n/v/d and vertigo.  A. Fib on EKG. Starting enoxaparin.  Received 40mg  initially in ED, will give an additional 60mg  now for 1mg /kg dose.    Plan:  Lovenox 1mg /Kg SQ q12hrs Monitor labs, progress, CBC  Mimi Debellis A, RPH 05/31/2017,9:15 AM

## 2017-05-31 NOTE — Progress Notes (Signed)
Pt's IV catheter removed and intact. Pt's IV site clean dry and intact. Discharge instructions including medications and follow up appointments were reviewed and discussed with patient's sister. Pt's sister verbalized understanding of discharge instructions. All questions were answered and no further questions at this time. Pt in stable condition and in no acute distress at time of discharge. Pt escorted by RN.

## 2017-05-31 NOTE — Progress Notes (Signed)
*  PRELIMINARY RESULTS* Echocardiogram 2D Echocardiogram has been performed.  Stacey DrainWhite, Aairah Negrette J 05/31/2017, 9:12 AM

## 2017-05-31 NOTE — Progress Notes (Signed)
MD has ordered Eliquis and case manager has supplied 30 day voucher for Eliquis. Pt's sister is currently in the room and Pharmacist is in room to educate patient and her sister on Eliquis.

## 2017-05-31 NOTE — Care Management Note (Signed)
Case Management Note  Patient Details  Name: Meridee ScoreFannie L Lamay MRN: [id number] Date of Birth: 01/14/1941    If discussed at Long Length of Stay Meetings, dates discussed:    Additional Comments: Patient discharging home today. Will be new to xarelto  Free 30 day voucher given by CM. Education done be pharmacist.   Milagros EvenerHunnicutt, Chrystine OilerSharley Diane, RN 05/31/2017, 1:58 PM

## 2017-05-31 NOTE — Care Management Note (Signed)
Case Management Note  Patient Details  Name: Deborah Benjamin MRN: [id number] Date of Birth: 01/14/1941  Subjective/Objective:  Adm with hyponatremia/afib. From home alone, ind PTA. Sister lives next door. She has PCP, sister drives her to appointments. New afib.                 Action/Plan:Anticipate DC on anticoagulation med. CM following for needs.   Expected Discharge Date:       06/01/2017           Expected Discharge Plan:  Home/Self Care  In-House Referral:     Discharge planning Services  CM Consult  Post Acute Care Choice:    Choice offered to:     DME Arranged:    DME Agency:     HH Arranged:    HH Agency:     Status of Service:  In process, will continue to follow  If discussed at Long Length of Stay Meetings, dates discussed:    Additional Comments:  Trayce Caravello, Chrystine OilerSharley Diane, RN 05/31/2017, 11:26 AM

## 2017-05-31 NOTE — Progress Notes (Signed)
Pt's sister states that she does not want patient on Xarelto. MD E-Paged.

## 2017-05-31 NOTE — Care Management Obs Status (Signed)
MEDICARE OBSERVATION STATUS NOTIFICATION   Patient Details  Name: Deborah ScoreFannie L Rafalski MRN: [id number] Date of Birth: 01/14/1941   Medicare Observation Status Notification Given:  Yes    Brithany Whitworth, Chrystine OilerSharley Diane, RN 05/31/2017, 11:00 AM

## 2017-05-31 NOTE — Care Management (Signed)
Patient will be prescribed Eliquis instead of Xarelto. Voucher for Eliquis will be given to patient by RN.

## 2017-05-31 NOTE — Discharge Summary (Addendum)
Physician Discharge Summary  Deborah Benjamin ZOX:[id number] DOB: 16-Jun-1941 DOA: 05/30/2017  PCP: Pearson Grippe, MD  Admit date: 05/30/2017 Discharge date: 05/31/2017  Admitted From: home Disposition:  home  Recommendations for Outpatient Follow-up:  1. Follow up with PCP in 1-2 weeks 2. Please obtain BMP/CBC in one week 3. Follow up with cardiology on 6/21  Home Health: Equipment/Devices:  Discharge Condition: stable CODE STATUS: DNR Diet recommendation: Heart Healthy / Carb Modified   Brief/Interim Summary: 76 y/o female who presents to the hospital with complaints of dizziness. She also had nausea, vomiting, diarrhea and vertigo for approximately 2 weeks prior to admission. She was noted to be hyponatremic on arrival. She was also taking hydrochlorothiazide prior to admission. She was started on IV saline with improvement of serum sodium and overall symptoms. Incidentally, she was found to be in atrial fibrillation on telemetry monitoring. Her heart rate was controlled. She is chronically on atenolol, but this was changed to metoprolol. Her echocardiogram was found to be unremarkable. CHADSVASc score is is at least 4. She's been started on anticoagulation with Eliquis. patient is feeling significantly better. Hydrochlorothiazide has been discontinued. She feels ready to discharge home. She has been set up to follow up with cardiology for further management of atrial fibrillation.  Discharge Diagnoses:  Principal Problem:   Hyponatremia Active Problems:   Atrial fibrillation and flutter (HCC)   Essential hypertension   Vertigo   Diabetes mellitus type 2 in obese [facility])   Hyperlipidemia    Discharge Instructions  Discharge Instructions    Diet - low sodium heart healthy    Complete by:  As directed    Increase activity slowly    Complete by:  As directed      Allergies as of 05/31/2017   No Active Allergies     Medication List    STOP taking these medications   atenolol 25  MG tablet Commonly known as:  TENORMIN   lisinopril-hydrochlorothiazide 20-12.5 MG tablet Commonly known as:  PRINZIDE,ZESTORETIC     TAKE these medications   acetaminophen 500 MG tablet Commonly known as:  TYLENOL Take 500 mg by mouth daily as needed for pain.   apixaban 5 MG Tabs tablet Commonly known as:  ELIQUIS Take 1 tablet (5 mg total) by mouth 2 (two) times daily.   atorvastatin 40 MG tablet Commonly known as:  LIPITOR Take 40 mg by mouth every morning.   lisinopril 20 MG tablet Commonly known as:  PRINIVIL,ZESTRIL Take 1 tablet (20 mg total) by mouth daily.   meclizine 25 MG tablet Commonly known as:  ANTIVERT Take 25 mg by mouth 4 (four) times daily as needed for dizziness.   metFORMIN 500 MG 24 hr tablet Commonly known as:  GLUCOPHAGE-XR Take 1 tablet by mouth 2 (two) times daily.   Metoprolol Tartrate 37.5 MG Tabs Take 37.5 mg by mouth 2 (two) times daily.   multivitamin with minerals Tabs tablet Take 1 tablet by mouth daily.   omeprazole 20 MG capsule Commonly known as:  PRILOSEC Take 20 mg by mouth daily.   ondansetron 4 MG tablet Commonly known as:  ZOFRAN Take 4 mg by mouth 3 (three) times daily.   vitamin B-12 1000 MCG tablet Commonly known as:  CYANOCOBALAMIN Take 1,000 mcg by mouth daily.      Follow-up Information    Gunnar Fusi Ross,MD Follow up on 06/16/2017.   Why:  cardiology follow up at 10:40am Contact information: [facility] [address] [place] [address]  [phone]       Pearson Grippe, MD. Schedule an appointment as soon as possible for a visit in 2 week(s).   Specialty:  Internal Medicine Contact information: [address] [place] [address] [phone]          No Active Allergies  Consultations:     Procedures/Studies: Ct Head Wo Contrast  Result Date: 05/30/2017 CLINICAL DATA:  Dizziness for 2 weeks. EXAM: CT HEAD WITHOUT CONTRAST TECHNIQUE: Contiguous axial images were obtained  from the base of the skull through the vertex without intravenous contrast. COMPARISON:  03/06/2015 FINDINGS: Brain: No evidence of acute infarction, hemorrhage, hydrocephalus, extra-axial collection or mass lesion/mass effect. Mild periventricular microangiopathy. Vascular: Calcific atherosclerotic disease at the skullbase. Skull: Normal. Negative for fracture or focal lesion. Sinuses/Orbits: No acute finding. Other: None. IMPRESSION: No acute intracranial abnormality. Mild chronic microvascular disease. Electronically Signed   By: Ted Mcalpine M.D.   On: 05/30/2017 17:13   Dg Abd Acute W/chest  Result Date: 05/30/2017 CLINICAL DATA:  Nausea, vomiting, and diarrhea for 2 weeks. Vertigo. EXAM: DG ABDOMEN ACUTE W/ 1V CHEST COMPARISON:  Chest x-ray dated 03/06/2015 and radiograph dated 03/30/2017 FINDINGS: There is no evidence of dilated bowel loops or free intraperitoneal air. No radiopaque calculi or other significant radiographic abnormality is seen. Heart size and mediastinal contours are within normal limits. Both lungs are clear. Aortic atherosclerosis. IMPRESSION: Negative abdominal radiographs.  No acute cardiopulmonary disease. Aortic atherosclerosis. Electronically Signed   By: Francene Boyers M.D.   On: 05/30/2017 17:45   Echo: Upper normal LV wall thickness with LVEF 60-65%. Indeterminate   diastolic function. Mild to moderate left atrial enlargement.   Mildly calcified mitral annulus with mild mitral regurgitation.   Mildly sclerotic aortic valve. Trivial tricuspid regurgitation   with PASP estimated 30 mmHg.   Subjective: Feeling better. No dizziness or lightheadedness. No chest pain  Discharge Exam: Vitals:   05/31/17 0922 05/31/17 1329  BP: (!) 143/73 (!) 161/81  Pulse: 97 92  Resp:  18  Temp:  98.5 F (36.9 C)   Vitals:   05/30/17 2030 05/31/17 0538 05/31/17 0922 05/31/17 1329  BP: (!) 148/69 (!) 144/66 (!) 143/73 (!) 161/81  Pulse: (!) 115 89 97 92  Resp: 18 18  18    Temp: 97.7 F (36.5 C) 98.4 F (36.9 C)  98.5 F (36.9 C)  TempSrc: Oral Oral  Oral  SpO2: 100% 98%  98%  Weight: 100.1 kg (220 lb 11.2 oz)     Height: 5\' 5"  (1.651 m)       General: Pt is alert, awake, not in acute distress Cardiovascular: irregular, S1/S2 +, no rubs, no gallops Respiratory: CTA bilaterally, no wheezing, no rhonchi Abdominal: Soft, NT, ND, bowel sounds + Extremities: no edema, no cyanosis    The results of significant diagnostics from this hospitalization (including imaging, microbiology, ancillary and laboratory) are listed below for reference.     Microbiology: No results found for this or any previous visit (from the past 240 hour(s)).   Labs: BNP (last 3 results) No results for input(s): BNP in the last 8760 hours. Basic Metabolic Panel:  Recent Labs Lab 05/30/17 1403 05/31/17 0512  NA 127* 131*  K 4.2 4.1  CL 92* 99*  CO2 22 22  GLUCOSE 129* 136*  BUN 16 14  CREATININE 1.38* 1.10*  CALCIUM 9.1 8.7*   Liver Function Tests:  Recent Labs Lab 05/30/17 1627  AST 28  ALT 18  ALKPHOS 64  BILITOT 1.0  PROT 7.2  ALBUMIN 4.4    Recent Labs Lab 05/30/17 1627  LIPASE 33   No results for input(s): AMMONIA in the last 168 hours. CBC:  Recent Labs Lab 05/30/17 1403 05/30/17 1627 05/31/17 0512  WBC 10.7*  --  8.1  NEUTROABS  --  6.7  --   HGB 11.7*  --  10.6*  HCT 33.7*  --  30.6*  MCV 87.8  --  88.2  PLT 454*  --  378   Cardiac Enzymes:  Recent Labs Lab 05/30/17 1627  TROPONINI <0.03   BNP: Invalid input(s): POCBNP CBG:  Recent Labs Lab 05/30/17 2104 05/31/17 0747 05/31/17 1124  GLUCAP 131* 132* 147*   D-Dimer No results for input(s): DDIMER in the last 72 hours. Hgb A1c No results for input(s): HGBA1C in the last 72 hours. Lipid Profile  Recent Labs  05/31/17 0512  CHOL 148  HDL 71  LDLCALC 62  TRIG 77  CHOLHDL 2.1   Thyroid function studies  Recent Labs  05/30/17 1627  TSH 1.530   Anemia  work up No results for input(s): VITAMINB12, FOLATE, FERRITIN, TIBC, IRON, RETICCTPCT in the last 72 hours. Urinalysis    Component Value Date/Time   COLORURINE YELLOW 05/30/2017 1354   APPEARANCEUR HAZY (A) 05/30/2017 1354   LABSPEC 1.009 05/30/2017 1354   PHURINE 5.0 05/30/2017 1354   GLUCOSEU NEGATIVE 05/30/2017 1354   HGBUR SMALL (A) 05/30/2017 1354   BILIRUBINUR NEGATIVE 05/30/2017 1354   KETONESUR NEGATIVE 05/30/2017 1354   PROTEINUR NEGATIVE 05/30/2017 1354   UROBILINOGEN 0.2 03/06/2015 1213   NITRITE NEGATIVE 05/30/2017 1354   LEUKOCYTESUR MODERATE (A) 05/30/2017 1354   Sepsis Labs Invalid input(s): PROCALCITONIN,  WBC,  LACTICIDVEN Microbiology No results found for this or any previous visit (from the past 240 hour(s)).   Time coordinating discharge: Over 30 minutes  SIGNED:   Erick BlinksMEMON,Denym Rahimi, MD  [facility] 05/31/2017, 3:47 PM Pager   If 7PM-7AM, please contact night-coverage [email or web address] Password [email or web address]

## 2017-06-01 LAB — URINE CULTURE

## 2017-06-16 ENCOUNTER — Encounter: Payer: Self-pay | Admitting: Internal Medicine

## 2017-06-16 ENCOUNTER — Ambulatory Visit: Payer: Medicare Other | Admitting: Internal Medicine

## 2017-06-29 ENCOUNTER — Emergency Department (HOSPITAL_COMMUNITY): Payer: Medicare Other

## 2017-06-29 ENCOUNTER — Inpatient Hospital Stay (HOSPITAL_COMMUNITY)
Admission: EM | Admit: 2017-06-29 | Discharge: 2017-07-11 | DRG: 871 | Disposition: A | Discharge status: Skilled Nursing Facility | Payer: Medicare Other | Attending: Internal Medicine | Admitting: Internal Medicine

## 2017-06-29 ENCOUNTER — Encounter (HOSPITAL_COMMUNITY): Payer: Self-pay | Admitting: Cardiology

## 2017-06-29 DIAGNOSIS — E1122 Type 2 diabetes mellitus with diabetic chronic kidney disease: Secondary | ICD-10-CM | POA: Diagnosis present

## 2017-06-29 DIAGNOSIS — E669 Obesity, unspecified: Secondary | ICD-10-CM | POA: Diagnosis present

## 2017-06-29 DIAGNOSIS — N183 Chronic kidney disease, stage 3 unspecified: Secondary | ICD-10-CM

## 2017-06-29 DIAGNOSIS — Z7901 Long term (current) use of anticoagulants: Secondary | ICD-10-CM

## 2017-06-29 DIAGNOSIS — A419 Sepsis, unspecified organism: Secondary | ICD-10-CM | POA: Diagnosis present

## 2017-06-29 DIAGNOSIS — Z800 Family history of malignant neoplasm of digestive organs: Secondary | ICD-10-CM

## 2017-06-29 DIAGNOSIS — E871 Hypo-osmolality and hyponatremia: Secondary | ICD-10-CM | POA: Diagnosis not present

## 2017-06-29 DIAGNOSIS — G9340 Encephalopathy, unspecified: Secondary | ICD-10-CM | POA: Diagnosis present

## 2017-06-29 DIAGNOSIS — I129 Hypertensive chronic kidney disease with stage 1 through stage 4 chronic kidney disease, or unspecified chronic kidney disease: Secondary | ICD-10-CM | POA: Diagnosis present

## 2017-06-29 DIAGNOSIS — N179 Acute kidney failure, unspecified: Secondary | ICD-10-CM | POA: Diagnosis present

## 2017-06-29 DIAGNOSIS — R319 Hematuria, unspecified: Secondary | ICD-10-CM | POA: Diagnosis not present

## 2017-06-29 DIAGNOSIS — A0472 Enterocolitis due to Clostridium difficile, not specified as recurrent: Secondary | ICD-10-CM | POA: Diagnosis not present

## 2017-06-29 DIAGNOSIS — Z66 Do not resuscitate: Secondary | ICD-10-CM | POA: Diagnosis present

## 2017-06-29 DIAGNOSIS — Z515 Encounter for palliative care: Secondary | ICD-10-CM | POA: Diagnosis not present

## 2017-06-29 DIAGNOSIS — I482 Chronic atrial fibrillation: Secondary | ICD-10-CM | POA: Diagnosis not present

## 2017-06-29 DIAGNOSIS — I10 Essential (primary) hypertension: Secondary | ICD-10-CM | POA: Diagnosis not present

## 2017-06-29 DIAGNOSIS — E876 Hypokalemia: Secondary | ICD-10-CM | POA: Diagnosis not present

## 2017-06-29 DIAGNOSIS — I248 Other forms of acute ischemic heart disease: Secondary | ICD-10-CM | POA: Diagnosis present

## 2017-06-29 DIAGNOSIS — E1169 Type 2 diabetes mellitus with other specified complication: Secondary | ICD-10-CM | POA: Diagnosis present

## 2017-06-29 DIAGNOSIS — I481 Persistent atrial fibrillation: Secondary | ICD-10-CM | POA: Diagnosis present

## 2017-06-29 DIAGNOSIS — I4891 Unspecified atrial fibrillation: Secondary | ICD-10-CM | POA: Diagnosis not present

## 2017-06-29 DIAGNOSIS — Z6836 Body mass index (BMI) 36.0-36.9, adult: Secondary | ICD-10-CM | POA: Diagnosis not present

## 2017-06-29 DIAGNOSIS — E860 Dehydration: Secondary | ICD-10-CM | POA: Diagnosis present

## 2017-06-29 DIAGNOSIS — I4821 Permanent atrial fibrillation: Secondary | ICD-10-CM

## 2017-06-29 DIAGNOSIS — Z9049 Acquired absence of other specified parts of digestive tract: Secondary | ICD-10-CM

## 2017-06-29 DIAGNOSIS — Z833 Family history of diabetes mellitus: Secondary | ICD-10-CM

## 2017-06-29 DIAGNOSIS — E785 Hyperlipidemia, unspecified: Secondary | ICD-10-CM | POA: Diagnosis present

## 2017-06-29 DIAGNOSIS — R0902 Hypoxemia: Secondary | ICD-10-CM

## 2017-06-29 DIAGNOSIS — E870 Hyperosmolality and hypernatremia: Secondary | ICD-10-CM | POA: Diagnosis present

## 2017-06-29 DIAGNOSIS — Z8249 Family history of ischemic heart disease and other diseases of the circulatory system: Secondary | ICD-10-CM

## 2017-06-29 DIAGNOSIS — I959 Hypotension, unspecified: Secondary | ICD-10-CM | POA: Diagnosis present

## 2017-06-29 DIAGNOSIS — Z7189 Other specified counseling: Secondary | ICD-10-CM | POA: Diagnosis not present

## 2017-06-29 DIAGNOSIS — R748 Abnormal levels of other serum enzymes: Secondary | ICD-10-CM | POA: Diagnosis not present

## 2017-06-29 DIAGNOSIS — Z7984 Long term (current) use of oral hypoglycemic drugs: Secondary | ICD-10-CM

## 2017-06-29 DIAGNOSIS — Z90710 Acquired absence of both cervix and uterus: Secondary | ICD-10-CM

## 2017-06-29 DIAGNOSIS — I340 Nonrheumatic mitral (valve) insufficiency: Secondary | ICD-10-CM | POA: Diagnosis present

## 2017-06-29 DIAGNOSIS — G9341 Metabolic encephalopathy: Secondary | ICD-10-CM | POA: Diagnosis present

## 2017-06-29 DIAGNOSIS — R4182 Altered mental status, unspecified: Secondary | ICD-10-CM

## 2017-06-29 DIAGNOSIS — Z452 Encounter for adjustment and management of vascular access device: Secondary | ICD-10-CM

## 2017-06-29 DIAGNOSIS — R6510 Systemic inflammatory response syndrome (SIRS) of non-infectious origin without acute organ dysfunction: Secondary | ICD-10-CM | POA: Diagnosis not present

## 2017-06-29 DIAGNOSIS — N390 Urinary tract infection, site not specified: Secondary | ICD-10-CM | POA: Diagnosis present

## 2017-06-29 DIAGNOSIS — R1310 Dysphagia, unspecified: Secondary | ICD-10-CM | POA: Diagnosis not present

## 2017-06-29 HISTORY — DX: Unspecified atrial fibrillation: I48.91

## 2017-06-29 LAB — PROTIME-INR
INR: 1.07
PROTHROMBIN TIME: 13.9 s (ref 11.4–15.2)

## 2017-06-29 LAB — TSH: TSH: 0.416 u[IU]/mL (ref 0.350–4.500)

## 2017-06-29 LAB — CBC WITH DIFFERENTIAL/PLATELET
BASOS PCT: 0 %
Basophils Absolute: 0 10*3/uL (ref 0.0–0.1)
Eosinophils Absolute: 0 10*3/uL (ref 0.0–0.7)
Eosinophils Relative: 0 %
HEMATOCRIT: 34.3 % — AB (ref 36.0–46.0)
HEMOGLOBIN: 12 g/dL (ref 12.0–15.0)
Lymphocytes Relative: 6 %
Lymphs Abs: 1 10*3/uL (ref 0.7–4.0)
MCH: 31.2 pg (ref 26.0–34.0)
MCHC: 35 g/dL (ref 30.0–36.0)
MCV: 89.1 fL (ref 78.0–100.0)
MONOS PCT: 5 %
Monocytes Absolute: 0.8 10*3/uL (ref 0.1–1.0)
NEUTROS ABS: 13 10*3/uL — AB (ref 1.7–7.7)
NEUTROS PCT: 89 %
Platelets: 435 10*3/uL — ABNORMAL HIGH (ref 150–400)
RBC: 3.85 MIL/uL — ABNORMAL LOW (ref 3.87–5.11)
RDW: 13.3 % (ref 11.5–15.5)
WBC: 14.8 10*3/uL — ABNORMAL HIGH (ref 4.0–10.5)

## 2017-06-29 LAB — URINALYSIS, ROUTINE W REFLEX MICROSCOPIC
BILIRUBIN URINE: NEGATIVE
Glucose, UA: NEGATIVE mg/dL
KETONES UR: 5 mg/dL — AB
NITRITE: NEGATIVE
PH: 5 (ref 5.0–8.0)
Protein, ur: 30 mg/dL — AB
SPECIFIC GRAVITY, URINE: 1.019 (ref 1.005–1.030)

## 2017-06-29 LAB — COMPREHENSIVE METABOLIC PANEL
ALBUMIN: 4.1 g/dL (ref 3.5–5.0)
ALK PHOS: 65 U/L (ref 38–126)
ALT: 20 U/L (ref 14–54)
ANION GAP: 19 — AB (ref 5–15)
AST: 21 U/L (ref 15–41)
BUN: 51 mg/dL — ABNORMAL HIGH (ref 6–20)
CALCIUM: 10.2 mg/dL (ref 8.9–10.3)
CO2: 20 mmol/L — AB (ref 22–32)
Chloride: 105 mmol/L (ref 101–111)
Creatinine, Ser: 1.54 mg/dL — ABNORMAL HIGH (ref 0.44–1.00)
GFR calc non Af Amer: 32 mL/min — ABNORMAL LOW (ref >60)
GFR, EST AFRICAN AMERICAN: 37 mL/min — AB (ref >60)
GLUCOSE: 183 mg/dL — AB (ref 65–99)
POTASSIUM: 3.4 mmol/L — AB (ref 3.5–5.1)
SODIUM: 144 mmol/L (ref 135–145)
Total Bilirubin: 1.5 mg/dL — ABNORMAL HIGH (ref 0.3–1.2)
Total Protein: 7.2 g/dL (ref 6.5–8.1)

## 2017-06-29 LAB — TROPONIN I
Troponin I: 0.03 ng/mL (ref <0.03)
Troponin I: 0.06 ng/mL (ref <0.03)

## 2017-06-29 LAB — I-STAT CG4 LACTIC ACID, ED: LACTIC ACID, VENOUS: 1.59 mmol/L (ref 0.5–1.9)

## 2017-06-29 LAB — LACTIC ACID, PLASMA: LACTIC ACID, VENOUS: 1.1 mmol/L (ref 0.5–1.9)

## 2017-06-29 LAB — APTT: aPTT: 21 seconds — ABNORMAL LOW (ref 24–36)

## 2017-06-29 LAB — PROCALCITONIN: Procalcitonin: <0.1 ng/mL

## 2017-06-29 LAB — HEMOGLOBIN A1C
HEMOGLOBIN A1C: 6.6 % — AB (ref 4.8–5.6)
MEAN PLASMA GLUCOSE: 143 mg/dL

## 2017-06-29 MED ORDER — VANCOMYCIN HCL IN DEXTROSE 1-5 GM/200ML-% IV SOLN: 1000.0000 mg | Freq: Once | INTRAVENOUS | Status: DC

## 2017-06-29 MED ORDER — DILTIAZEM HCL 100 MG IV SOLR
5.0000 mg/h | INTRAVENOUS | Status: DC
  Administered 2017-06-29: 7.5 mg/h via INTRAVENOUS
  Administered 2017-06-30: 15 mg/h via INTRAVENOUS
  Administered 2017-06-30: 10 mg/h via INTRAVENOUS
  Administered 2017-07-01 – 2017-07-04 (×11): 15 mg/h via INTRAVENOUS
  Filled 2017-06-29 (×13): qty 100

## 2017-06-29 MED ORDER — HALOPERIDOL LACTATE 5 MG/ML IJ SOLN
2.0000 mg | Freq: Once | INTRAMUSCULAR | Status: AC
Start: 2017-06-29 — End: 2017-06-29
  Administered 2017-06-29: 2 mg via INTRAVENOUS
  Filled 2017-06-29: qty 1

## 2017-06-29 MED ORDER — DILTIAZEM LOAD VIA INFUSION
15.0000 mg | Freq: Once | INTRAVENOUS | Status: AC
  Administered 2017-06-29: 15 mg via INTRAVENOUS
  Filled 2017-06-29: qty 15

## 2017-06-29 MED ORDER — PIPERACILLIN-TAZOBACTAM 3.375 G IVPB 30 MIN
3.3750 g | Freq: Once | INTRAVENOUS | Status: AC
  Administered 2017-06-29: 3.375 g via INTRAVENOUS
  Filled 2017-06-29 (×2): qty 50

## 2017-06-29 MED ORDER — INSULIN ASPART 100 UNIT/ML ~~LOC~~ SOLN
0.0000 [IU] | Freq: Three times a day (TID) | SUBCUTANEOUS | Status: DC
  Administered 2017-06-30: 2 [IU] via SUBCUTANEOUS

## 2017-06-29 MED ORDER — DILTIAZEM HCL 100 MG IV SOLR
5.0000 mg/h | INTRAVENOUS | Status: DC
  Administered 2017-06-29: 5 mg/h via INTRAVENOUS
  Administered 2017-06-29: 15 mg/h via INTRAVENOUS
  Filled 2017-06-29: qty 100

## 2017-06-29 MED ORDER — INSULIN ASPART 100 UNIT/ML ~~LOC~~ SOLN: 0.0000 [IU] | Freq: Every day | SUBCUTANEOUS | Status: DC

## 2017-06-29 MED ORDER — SODIUM CHLORIDE 0.9 % IV BOLUS (SEPSIS)
1000.0000 mL | Freq: Once | INTRAVENOUS | Status: AC
  Administered 2017-06-29: 1000 mL via INTRAVENOUS

## 2017-06-29 MED ORDER — DEXTROSE 5 % IV SOLN
1.0000 g | Freq: Once | INTRAVENOUS | Status: AC
  Administered 2017-06-29: 1 g via INTRAVENOUS
  Filled 2017-06-29: qty 10

## 2017-06-29 MED ORDER — ACETAMINOPHEN 325 MG PO TABS
650.0000 mg | ORAL_TABLET | ORAL | Status: DC | PRN
  Administered 2017-07-09: 650 mg via ORAL
  Filled 2017-06-29: qty 2

## 2017-06-29 MED ORDER — LORAZEPAM 2 MG/ML IJ SOLN
1.0000 mg | Freq: Once | INTRAMUSCULAR | Status: AC
Start: 2017-06-29 — End: 2017-06-29
  Administered 2017-06-29: 1 mg via INTRAVENOUS
  Filled 2017-06-29: qty 1

## 2017-06-29 MED ORDER — LORAZEPAM 2 MG/ML IJ SOLN
0.5000 mg | Freq: Once | INTRAMUSCULAR | Status: AC
  Administered 2017-06-29: 0.5 mg via INTRAVENOUS
  Filled 2017-06-29: qty 1

## 2017-06-29 MED ORDER — HEPARIN (PORCINE) IN NACL 100-0.45 UNIT/ML-% IJ SOLN
1100.0000 [IU]/h | INTRAMUSCULAR | Status: DC
  Administered 2017-06-29: 1200 [IU]/h via INTRAVENOUS
  Filled 2017-06-29: qty 250

## 2017-06-29 MED ORDER — ONDANSETRON HCL 4 MG/2ML IJ SOLN: 4.0000 mg | Freq: Four times a day (QID) | INTRAMUSCULAR | Status: DC | PRN

## 2017-06-29 MED ORDER — METOPROLOL TARTRATE 5 MG/5ML IV SOLN
5.0000 mg | Freq: Once | INTRAVENOUS | Status: AC
  Administered 2017-06-29: 5 mg via INTRAVENOUS
  Filled 2017-06-29: qty 5

## 2017-06-29 MED ORDER — SODIUM CHLORIDE 0.9 % IV SOLN
1000.0000 mL | INTRAVENOUS | Status: DC
  Administered 2017-06-29 – 2017-06-30 (×2): 1000 mL via INTRAVENOUS

## 2017-06-29 MED ORDER — HALOPERIDOL LACTATE 5 MG/ML IJ SOLN
5.0000 mg | Freq: Once | INTRAMUSCULAR | Status: AC
  Administered 2017-06-29: 5 mg via INTRAVENOUS
  Filled 2017-06-29: qty 1

## 2017-06-29 MED ORDER — VANCOMYCIN HCL 10 G IV SOLR
1500.0000 mg | Freq: Once | INTRAVENOUS | Status: AC
  Administered 2017-06-29: 1500 mg via INTRAVENOUS
  Filled 2017-06-29 (×2): qty 1500

## 2017-06-29 MED ORDER — BACITRACIN ZINC 500 UNIT/GM EX OINT
TOPICAL_OINTMENT | CUTANEOUS | Status: AC
  Filled 2017-06-29: qty 2.7

## 2017-06-29 NOTE — ED Notes (Signed)
Patient very agitated when we attempted a bedpan.  Purewick External Cath in place. Patient still attempting to pull cords and blood pressure cuff off.  Patient become extremely agitated and nurse was notified.  Mitts were placed on patient to ensure she would not pull her IV out

## 2017-06-29 NOTE — ED Notes (Signed)
Pt is now able to answer questions that Dr. Effie ShyWentz is asking about pain, and she is stating that she does not feel good.

## 2017-06-29 NOTE — Progress Notes (Signed)
Pharmacy Antibiotic Note  [name] is a 76 y.o. female admitted on 06/29/2017 with sepsis.  Pharmacy has been consulted for Vancomycin and Zosyn dosing.  Also asked to initiate Heparin for afib.  Pt reportedly on Eliquis PTA but last dose not recorded.  Baseline labs pending.  Plan:  Vancomycin 1500mg  IV x 1 dose tonight Zosyn 3.375gm IV q8h, EID Heparin infusion at 1200 units/hr Monitor labs, renal fxn, progress and c/s Deescalate ABX when improved / appropriate.   Heparin level, aPTT, CBC in am  Height: 5\' 5"  (165.1 cm) Weight: 220 lb (99.8 kg) IBW/kg (Calculated) : 57  Temp (24hrs), Avg:98.5 F (36.9 C), Min:97.7 F (36.5 C), Max:99.3 F (37.4 C)   Recent Labs Lab 06/29/17 1304 06/29/17 1316  WBC 14.8*  --   CREATININE 1.54*  --   LATICACIDVEN  --  1.59    Estimated Creatinine Clearance: 37.5 mL/min (A) (by C-G formula based on SCr of 1.54 mg/dL (H)).    No Active Allergies  Antimicrobials this admission: Vancomycin 7/4 >>  Zosyn 7/4 >>   Dose adjustments this admission:  Microbiology results:  BCx: pending  UCx:  pending  Sputum:    MRSA PCR:   Thank you for allowing pharmacy to be a part of this patient's care.  Valrie HartHall, Refoel Palladino A 06/29/2017 10:24 PM

## 2017-06-29 NOTE — ED Triage Notes (Signed)
Pt seen here one week ago with vertigo, nausea and vomiting.  Pt has been confused since being seen here one week ago.  Per ems pt also a-fib rate 120-150.  CBG 172.  EMS also states that Pt's living condition was poor.  States dog feces all in house.  Pt states she forgot she had a dog.

## 2017-06-29 NOTE — H&P (Addendum)
History and Physical    Deborah Benjamin:[id number] DOB: 22-Jul-1941 DOA: 06/29/2017  PCP: Pearson Grippe, MD Consultants:  Leonette Most - nephrology Patient coming from: Home - lives alone; [facility]: sister, [phone]  Chief Complaint: AMS  HPI: Deborah Benjamin is a 76 y.o. female with medical history significant of HTN, HLD, DM, vertigo and recent (6/4-5) admission for new-onset afib and hyponatremia presenting with altered mental status.  The patient was agitated and unable to effectively answer questions on presentation but has had Haldol and is now obtunded and unable to provide history.  History was provided by her sister via telephone.  On 6/5, her sister picked her and "she was not right then."  She didn't know that she had seen the doctor that day, couldn't answer questions appropriately, didn't know where she was.  She talked to the nurse and then took the patient home.  Didn't recognize her house, didn't know she had a dog.  Since then she has continued to get worse but it has been waxing and waning.  Last week, she started talking weird, not acting like herself.  This week, her sister took her something to eat on Saturday.   She said "I don't eat."  AMS has been waxing and waning.  She was okay Sunday and Monday.  Tuesday, she was confused again, "off the wall, a little bit worse."  Today, she didn't recognize her sister at all.  Her eyes were "wandering, somewhere else."  She asked her "Who are you? What are you doing here? Have you been out feeding the hogs this morning?  I don't have a dog."  Her sister called 911.  The patient has refused to go to the doctor since hospital discharge.  She had pooped in the bed and there was stool everywhere today.  Persistent nausea since last admission.  Sister does not know if she has been taking her medications.  +agitated.   Sister is concerned that this could be a stroke.  "My momma had a stroke and she acted the same way.  Her words are slurred, can't  understand her".    She does not clean her house much.  Her floors are falling through.  Her house is a mess.  Dog voids and stools in her house.  Her sister doubts very seriously that she would consider placement.   ED Course:  Calm, able to follow commands, verbally responsive but not able to effectively answer questions.  Afib with RVR, started on Cardizem with some difficulty in dosing due to HR and BP discoordination).  Patient tremulous and agitated, given Haldol x 2.  Given Lopressor x 1.  Review of Systems: Unable to assess  Ambulatory Status:  Ambulates with a cane or walker - but won't use them in her house  Past Medical History:  Diagnosis Date  . Atrial fibrillation (HCC)   . Diabetes mellitus without complication (HCC)   . High cholesterol   . Hypertension   . Vertigo     Past Surgical History:  Procedure Laterality Date  . ABDOMINAL HYSTERECTOMY    . CHOLECYSTECTOMY    . CYST EXCISION      Social History   Social History  . Marital status: Divorced    Spouse name: N/A  . Number of children: N/A  . Years of education: N/A   Occupational History  . retired    Social History Main Topics  . Smoking status: Never Smoker  . Smokeless tobacco: Never Used  .  Alcohol use No  . Drug use: No  . Sexual activity: Not on file   Other Topics Concern  . Not on file   Social History Narrative  . No narrative on file    No Active Allergies  Family History  Problem Relation Age of Onset  . Diabetes Sister   . CVA Mother 22  . CAD Father        64    Prior to Admission medications   Medication Sig Start Date End Date Taking? Authorizing Provider  apixaban (ELIQUIS) 5 MG TABS tablet Take 1 tablet (5 mg total) by mouth 2 (two) times daily. 05/31/17  Yes Erick Blinks, MD  cholecalciferol (VITAMIN D) 1000 units tablet Take 1,000 Units by mouth daily.   Yes [provider]  meclizine (ANTIVERT) 25 MG tablet Take 25 mg by mouth 4 (four) times daily as  needed for dizziness.   Yes [provider]  metoprolol tartrate 37.5 MG TABS Take 37.5 mg by mouth 2 (two) times daily. 05/31/17  Yes Erick Blinks, MD  Multiple Vitamin (MULTIVITAMIN WITH MINERALS) TABS Take 1 tablet by mouth daily.   Yes [provider]  ondansetron (ZOFRAN) 4 MG tablet Take 4 mg by mouth 3 (three) times daily.   Yes [provider]  vitamin B-12 (CYANOCOBALAMIN) 1000 MCG tablet Take 1,000 mcg by mouth daily.    Yes [provider]  acetaminophen (TYLENOL) 500 MG tablet Take 500 mg by mouth daily as needed for pain.    [provider]  atorvastatin (LIPITOR) 40 MG tablet Take 40 mg by mouth every morning.     [provider]  lisinopril (PRINIVIL,ZESTRIL) 20 MG tablet Take 1 tablet (20 mg total) by mouth daily. 05/31/17 05/31/18  Erick Blinks, MD  metFORMIN (GLUCOPHAGE-XR) 500 MG 24 hr tablet Take 1 tablet by mouth 2 (two) times daily. 03/16/17   [provider]  omeprazole (PRILOSEC) 20 MG capsule Take 20 mg by mouth daily.    [provider]    Physical Exam: Vitals:   06/29/17 2100 06/29/17 2130 06/29/17 2143 06/29/17 2145  BP: (!) 82/70 (!) 94/45 107/63 (!) 118/54  Pulse: (!) 113 (!) 113    Resp: (!) 22 (!) 21 20 (!) 21  Temp:      TempSrc:      SpO2: 97% 96% 97%   Weight:      Height:         General: Obtunded, appears chronically ill compared to at the time of my last admission on 6/4 Eyes: PERRL, normal lids ENT:  grossly normal hearing, lips extremely dry and cracking, dry mm Neck:  no LAD, masses or thyromegaly Cardiovascular:  Irregularly irregular, no m/r/g. No LE edema.  Respiratory:  CTA bilaterally, no w/r/r. Normal respiratory effort. Abdomen:  soft, +suprapubic TTP, nd, NABS Skin:  no rash or induration seen on limited exam; she continues to have dried feces on her legs Musculoskeletal:  grossly normal tone BUE/BLE, good ROM, no bony abnormality Psychiatric: obtunded, follows  commands but mostly unresponsive Neurologic: Unable to assess  Labs on Admission: I have personally reviewed following labs and imaging studies  CBC:  Recent Labs Lab 06/29/17 1304  WBC 14.8*  NEUTROABS 13.0*  HGB 12.0  HCT 34.3*  MCV 89.1  PLT 435*   Basic Metabolic Panel:  Recent Labs Lab 06/29/17 1304  NA 144  K 3.4*  CL 105  CO2 20*  GLUCOSE 183*  BUN 51*  CREATININE 1.54*  CALCIUM 10.2   GFR: Estimated Creatinine Clearance: 37.5 mL/min (A) (by C-G formula based on SCr of 1.54 mg/dL (H)). Liver Function Tests:  Recent Labs Lab 06/29/17 1304  AST 21  ALT 20  ALKPHOS 65  BILITOT 1.5*  PROT 7.2  ALBUMIN 4.1   No results for input(s): LIPASE, AMYLASE in the last 168 hours. No results for input(s): AMMONIA in the last 168 hours. Coagulation Profile: No results for input(s): INR, PROTIME in the last 168 hours. Cardiac Enzymes:  Recent Labs Lab 06/29/17 1304  TROPONINI 0.03*   BNP (last 3 results) No results for input(s): PROBNP in the last 8760 hours. HbA1C: No results for input(s): HGBA1C in the last 72 hours. CBG: No results for input(s): GLUCAP in the last 168 hours. Lipid Profile: No results for input(s): CHOL, HDL, LDLCALC, TRIG, CHOLHDL, LDLDIRECT in the last 72 hours. Thyroid Function Tests: No results for input(s): TSH, T4TOTAL, FREET4, T3FREE, THYROIDAB in the last 72 hours. Anemia Panel: No results for input(s): VITAMINB12, FOLATE, FERRITIN, TIBC, IRON, RETICCTPCT in the last 72 hours. Urine analysis:    Component Value Date/Time   COLORURINE AMBER (A) 06/29/2017 1342   APPEARANCEUR HAZY (A) 06/29/2017 1342   LABSPEC 1.019 06/29/2017 1342   PHURINE 5.0 06/29/2017 1342   GLUCOSEU NEGATIVE 06/29/2017 1342   HGBUR MODERATE (A) 06/29/2017 1342   BILIRUBINUR NEGATIVE 06/29/2017 1342   KETONESUR 5 (A) 06/29/2017 1342   PROTEINUR 30 (A) 06/29/2017 1342   UROBILINOGEN 0.2 03/06/2015 1213   NITRITE NEGATIVE 06/29/2017 1342    LEUKOCYTESUR MODERATE (A) 06/29/2017 1342    Creatinine Clearance: Estimated Creatinine Clearance: 37.5 mL/min (A) (by C-G formula based on SCr of 1.54 mg/dL (H)).  Sepsis Labs: @LABRCNTIP (procalcitonin:4,lacticidven:4) ) Recent Results (from the past 240 hour(s))  Blood Culture (routine x 2)     Status: None (Preliminary result)   Collection Time: 06/29/17  1:35 PM  Result Value Ref Range Status   Specimen Description BLOOD RIGHT WRIST  Final   Special Requests   Final    BOTTLES DRAWN AEROBIC AND ANAEROBIC Blood Culture results may not be optimal due to an inadequate volume of blood received in culture bottles   Culture NO GROWTH <12 HOURS  Final   Report Status PENDING  Incomplete  Culture, blood (Routine X 2) w Reflex to ID Panel     Status: None (Preliminary result)   Collection Time: 06/29/17  3:55 PM  Result Value Ref Range Status   Specimen Description BLOOD LEFT HAND  Final   Special Requests   Final    Blood Culture results may not be optimal due to an inadequate volume of blood received in culture bottles   Culture PENDING  Incomplete   Report Status PENDING  Incomplete     Radiological Exams on Admission: Ct Head Wo Contrast  Result Date: 06/29/2017 CLINICAL DATA:  Confusion for 1 week. EXAM: CT HEAD WITHOUT CONTRAST TECHNIQUE: Contiguous axial images were obtained from the base of the skull through the vertex without intravenous contrast. COMPARISON:  Head CT scan 05/30/2017 in 03/06/2015. FINDINGS: Brain: There is some chronic microvascular ischemic change. No evidence of acute intracranial abnormality including hemorrhage, infarct, mass lesion, mass effect, midline shift or abnormal extra-axial fluid collection. No hydrocephalus or pneumocephalus. Vascular: Atherosclerosis noted. Skull: Intact. Sinuses/Orbits: Negative. Other: None. IMPRESSION: No acute abnormality. Atherosclerosis. Mild appearing chronic microvascular ischemic change. Electronically Signed   By: Drusilla Kanner M.D.   On: 06/29/2017 16:25   Dg Chest [facility]  1 View  Result Date: 06/29/2017 CLINICAL DATA:  Altered mental status. EXAM: PORTABLE CHEST 1 VIEW COMPARISON:  Abdominal series on 05/30/2017 FINDINGS: The heart size and mediastinal contours are within normal limits. Lung volumes are low bilaterally. There is no evidence of pulmonary edema, consolidation, pneumothorax, nodule or pleural fluid. The visualized skeletal structures are unremarkable. IMPRESSION: No active disease. Electronically Signed   By: Irish Lack M.D.   On: 06/29/2017 13:25    EKG: Independently reviewed.  Afib with rate 143; no evidence of acute ischemia  Assessment/Plan Principal Problem:   Encephalopathy acute Active Problems:   Atrial fibrillation and flutter (HCC)   Essential hypertension   Diabetes mellitus type 2 in obese (HCC)   Sepsis (HCC)   AKI (acute kidney injury) (HCC)   Acute encephalopathy -Patient with prior overnight observation 6/4-5 for vertigo with diagnosis of new-onset afib presenting with intermittent but worsening delirium since then -Given her h/o afib and uncertainty about whether she has been taking Eliquis, CVA is a concern.  Head CT negative.  Will order MRI. -Sepsis from likely UTI is other most likely cause.  See below. -Patient also with dehydration/AKI.  Does not appear to be serious enough to cause uremia, but will follow with rehydration.  Sepsis  -Elevated WBC count (14.8), tachycardia  -Lactate 1.59 -While awaiting blood cultures, this appears to be a preseptic condition. -Sepsis protocol initiated; has received most of IVF bolus -Suspect urinary source - UA: many bacteria, moderate Hgb, 5 ketones, moderate LE, negative nitrite, 6-20 WBC and patient appears to endorse suprapubic tenderness -Blood and urine cultures pending -Will admit to SDU (see below) and continue to monitor -Treat with IV Vanc and Zosyn for now - while UTI appears to be most likely source this is  uncertain.  Will obtain MRSA PCR and if negative it is probably reasonable to narrow spectrum for coverage tomorrow.  Afib with RVR -Patient diagnosed with new-onset afib during her last hospitalization. -It is not clear if she has been taking her medications, including Lopressor and Eliquis. -Will admit to SDU for Diltiazem drip as per protocol  -Will cycle cardiac enzymes (initial troponin 0.03) -Echo was generally unremarkable during prior hospitalization although the diastolic function was not able to be determined.  Will not repeat Echo at this time. -Normal TSH with elevated free T4 during last hospitalization; will repeat. -Will request cardiology consultation for tomorrow AM; she was not seen by cards during her prior hospitalization. -CHA2DS2-VASc Score is >2 and so patient will need oral anticoagulation. She was prescribed Eliquis but it is not clear if she has been taking this. -Will start with treatment-dose Heparin drip for now.  AKI -BUN 51/Creatinine 1.53/GFR 32; prior 14/1.10/48 -Anion gap 19 -Bilirubin 1.5 -Suspect that these abnormalities all due to AKI/dehydration in the setting of poor PO intake. -Will rehydrate and follow.  HTN -Patient prescribed Lopressor as an outpatient; if effective, consider changing to Toprol XL for ease in dosing. -Hold Lisinopril due to mildly elevated creatinine; consider restarting tomorrow if creatinine is improved and patient is off Cardizem drip.  DM -Glucose 183 -Hold metformin. -A1c was 6.6 on 6/4. -Cover with SSI.    DVT prophylaxis: Treatment-dose Heparin  Code Status: DNR - confirmed with patient/family Family Communication: Sister via telephone Disposition Plan:  Home once clinically improved Consults called: None  Admission status: Admit - It is my clinical opinion that admission to INPATIENT is reasonable and necessary because this patient will require at least 2 midnights in  the hospital to treat this condition  based on the medical complexity of the problems presented.  Given the aforementioned information, the predictability of an adverse outcome is felt to be significant.   Total critical care time: 65 minutes Critical care time was exclusive of separately billable procedures and treating other patients. Critical care was necessary to treat or prevent imminent or life-threatening deterioration. Critical care was time spent personally by me on the following activities: development of treatment plan with patient and/or surrogate as well as nursing, discussions with consultants, evaluation of patient's response to treatment, examination of patient, obtaining history from patient or surrogate, ordering and performing treatments and interventions, ordering and review of laboratory studies, ordering and review of radiographic studies, pulse oximetry and re-evaluation of patient's condition.   Jonah BlueJennifer Mckynlie Vanderslice MD [facility]  If 7PM-7AM, please contact night-coverage [email or web address] Password [email or web address]  06/29/2017, 10:23 PM

## 2017-06-29 NOTE — ED Notes (Signed)
Deborah BeneJoyce (sister) Number : [phone] (cell) [phone] (home)

## 2017-06-29 NOTE — ED Provider Notes (Signed)
[facility] DEPT Provider Note   CSN: [id number] Arrival date & time: 06/29/17  1233     History   Chief Complaint Chief Complaint  Patient presents with  . Altered Mental Status    HPI Deborah Benjamin is a 76 y.o. female.  The patient is here for evaluation of altered mental status.  Patient is with her sister who gives the history.  The patient is worse today than yesterday.  She was confused yesterday and has been confused for a couple of weeks, since leaving the hospital.  It is unclear if she is eating or taking her medications.  The patient lives alone.  Her sister called EMS because the house was in disarray, with fecal matter from her dog, inside the home.   Level V Caveat  HPI  Past Medical History:  Diagnosis Date  . Diabetes mellitus without complication (HCC)   . High cholesterol   . Hypertension   . Vertigo     Patient Active Problem List   Diagnosis Date Noted  . Hyponatremia 05/30/2017  . Atrial fibrillation and flutter (HCC) 05/30/2017  . Essential hypertension 05/30/2017  . Vertigo 05/30/2017  . Diabetes mellitus type 2 in obese (HCC) 05/30/2017  . Hyperlipidemia 05/30/2017    Past Surgical History:  Procedure Laterality Date  . ABDOMINAL HYSTERECTOMY    . CHOLECYSTECTOMY    . CYST EXCISION      OB History    Gravida Para Term Preterm AB Living             0   SAB TAB Ectopic Multiple Live Births                   Home Medications    Prior to Admission medications   Medication Sig Start Date End Date Taking? Authorizing Provider  apixaban (ELIQUIS) 5 MG TABS tablet Take 1 tablet (5 mg total) by mouth 2 (two) times daily. 05/31/17  Yes Erick Blinks, MD  cholecalciferol (VITAMIN D) 1000 units tablet Take 1,000 Units by mouth daily.   Yes [provider]  meclizine (ANTIVERT) 25 MG tablet Take 25 mg by mouth 4 (four) times daily as needed for dizziness.   Yes [provider]  metoprolol tartrate 37.5 MG TABS Take  37.5 mg by mouth 2 (two) times daily. 05/31/17  Yes Erick Blinks, MD  Multiple Vitamin (MULTIVITAMIN WITH MINERALS) TABS Take 1 tablet by mouth daily.   Yes [provider]  ondansetron (ZOFRAN) 4 MG tablet Take 4 mg by mouth 3 (three) times daily.   Yes [provider]  vitamin B-12 (CYANOCOBALAMIN) 1000 MCG tablet Take 1,000 mcg by mouth daily.    Yes [provider]  acetaminophen (TYLENOL) 500 MG tablet Take 500 mg by mouth daily as needed for pain.    [provider]  atorvastatin (LIPITOR) 40 MG tablet Take 40 mg by mouth every morning.     [provider]  lisinopril (PRINIVIL,ZESTRIL) 20 MG tablet Take 1 tablet (20 mg total) by mouth daily. 05/31/17 05/31/18  Erick Blinks, MD  metFORMIN (GLUCOPHAGE-XR) 500 MG 24 hr tablet Take 1 tablet by mouth 2 (two) times daily. 03/16/17   [provider]  omeprazole (PRILOSEC) 20 MG capsule Take 20 mg by mouth daily.    [provider]    Family History Family History  Problem Relation Age of Onset  . Diabetes Sister     Social History Social History  Substance Use Topics  . Smoking  status: Never Smoker  . Smokeless tobacco: Never Used  . Alcohol use No     Allergies   Patient has no active allergies.   Review of Systems Review of Systems  Unable to perform ROS: Mental status change     Physical Exam Updated Vital Signs BP (!) 148/130   Pulse 79   Temp 99.3 F (37.4 C) (Rectal)   Resp 20   Ht 5\' 5"  (1.651 m)   Wt 99.8 kg (220 lb)   SpO2 100%   BMI 36.61 kg/m   Physical Exam  Constitutional: She appears well-developed.  Obese, elderly  HENT:  Head: Normocephalic and atraumatic.  Dry oral mucous membranes  Eyes: Conjunctivae and EOM are normal. Pupils are equal, round, and reactive to light. Right eye exhibits no discharge. No scleral icterus.  Neck: Normal range of motion and phonation normal. Neck supple. No tracheal deviation present.  Cardiovascular:    Irregular tachycardia.  No murmur.  Normal peripheral circulation.  Pulmonary/Chest: Effort normal and breath sounds normal. She exhibits no tenderness.  Abdominal: Soft. She exhibits no distension. There is no tenderness. There is no guarding.  Musculoskeletal: Normal range of motion.  Normal strength arms and legs bilaterally  Neurological: She is alert. She exhibits normal muscle tone.  No dysarthria or aphasia.  Confusion is present  Skin: Skin is warm and dry.  Psychiatric: She has a normal mood and affect. Her behavior is normal. Judgment and thought content normal.  Nursing note and vitals reviewed.    ED Treatments / Results  Labs (all labs ordered are listed, but only abnormal results are displayed) Labs Reviewed  COMPREHENSIVE METABOLIC PANEL - Abnormal; Notable for the following:       Result Value   Potassium 3.4 (*)    CO2 20 (*)    Glucose, Bld 183 (*)    BUN 51 (*)    Creatinine, Ser 1.54 (*)    Total Bilirubin 1.5 (*)    GFR calc non Af Amer 32 (*)    GFR calc Af Amer 37 (*)    Anion gap 19 (*)    All other components within normal limits  CBC WITH DIFFERENTIAL/PLATELET - Abnormal; Notable for the following:    WBC 14.8 (*)    RBC 3.85 (*)    HCT 34.3 (*)    Platelets 435 (*)    Neutro Abs 13.0 (*)    All other components within normal limits  URINALYSIS, ROUTINE W REFLEX MICROSCOPIC - Abnormal; Notable for the following:    Color, Urine AMBER (*)    APPearance HAZY (*)    Hgb urine dipstick MODERATE (*)    Ketones, ur 5 (*)    Protein, ur 30 (*)    Leukocytes, UA MODERATE (*)    Bacteria, UA MANY (*)    Squamous Epithelial / LPF 0-5 (*)    All other components within normal limits  TROPONIN I - Abnormal; Notable for the following:    Troponin I 0.03 (*)    All other components within normal limits  CULTURE, BLOOD (ROUTINE X 2)  CULTURE, BLOOD (ROUTINE X 2) W REFLEX TO ID PANEL  URINE CULTURE  I-STAT CG4 LACTIC ACID, ED  I-STAT CG4 LACTIC ACID,  ED   BUN  Date Value Ref Range Status  06/29/2017 51 (H) 6 - 20 mg/dL Final  16/09/9603 14 6 - 20 mg/dL Final  54/08/8118 16 6 - 20 mg/dL Final  14/78/2956 16 6 - 23 mg/dL  Final   Creatinine, Ser  Date Value Ref Range Status  06/29/2017 1.54 (H) 0.44 - 1.00 mg/dL Final  04/54/0981 1.91 (H) 0.44 - 1.00 mg/dL Final  47/82/9562 1.30 (H) 0.44 - 1.00 mg/dL Final  86/57/8469 6.29 0.50 - 1.10 mg/dL Final     EKG  EKG Interpretation  Date/Time:  Wednesday June 29 2017 12:42:51 EDT Ventricular Rate:  143 PR Interval:    QRS Duration: 88 QT Interval:  280 QTC Calculation: 432 R Axis:   42 Text Interpretation:  Atrial fibrillation Since last tracing rate faster Confirmed by Mancel Bale [phone]) on 06/29/2017 12:55:28 PM      CHA2DS2/VAS Stroke Risk Points      4 >= 2 Points: High Risk  1 - 1.99 Points: Medium Risk  0 Points: Low Risk    The patient's score has not changed in the past year.:  No Change         Details    Note: External data might be a factor in metrics not marked with    Points Metrics   This score determines the patient's risk of having a stroke if the  patient has atrial fibrillation.       0 Has Congestive Heart Failure:  No   0 Has Vascular Disease:  No   1 Has Hypertension:  Yes   1 Age:  38   1 Has Diabetes:  Yes   0 Had Stroke:  No Had TIA:  No Had thromboembolism:  No   1 Female:  Yes         Radiology Ct Head Wo Contrast  Result Date: 06/29/2017 CLINICAL DATA:  Confusion for 1 week. EXAM: CT HEAD WITHOUT CONTRAST TECHNIQUE: Contiguous axial images were obtained from the base of the skull through the vertex without intravenous contrast. COMPARISON:  Head CT scan 05/30/2017 in 03/06/2015. FINDINGS: Brain: There is some chronic microvascular ischemic change. No evidence of acute intracranial abnormality including hemorrhage, infarct, mass lesion, mass effect, midline shift or abnormal extra-axial fluid collection. No hydrocephalus or  pneumocephalus. Vascular: Atherosclerosis noted. Skull: Intact. Sinuses/Orbits: Negative. Other: None. IMPRESSION: No acute abnormality. Atherosclerosis. Mild appearing chronic microvascular ischemic change. Electronically Signed   By: Drusilla Kanner M.D.   On: 06/29/2017 16:25   Dg Chest Port 1 View  Result Date: 06/29/2017 CLINICAL DATA:  Altered mental status. EXAM: PORTABLE CHEST 1 VIEW COMPARISON:  Abdominal series on 05/30/2017 FINDINGS: The heart size and mediastinal contours are within normal limits. Lung volumes are low bilaterally. There is no evidence of pulmonary edema, consolidation, pneumothorax, nodule or pleural fluid. The visualized skeletal structures are unremarkable. IMPRESSION: No active disease. Electronically Signed   By: Irish Lack M.D.   On: 06/29/2017 13:25    Procedures Procedures (including critical care time)  Medications Ordered in ED Medications  sodium chloride 0.9 % bolus 1,000 mL (0 mLs Intravenous Stopped 06/29/17 1437)    Followed by  sodium chloride 0.9 % bolus 1,000 mL (0 mLs Intravenous Stopped 06/29/17 1444)    Followed by  0.9 %  sodium chloride infusion (1,000 mLs Intravenous New Bag/Given 06/29/17 1551)  bacitracin 500 UNIT/GM ointment (not administered)  diltiazem (CARDIZEM) 1 mg/mL load via infusion 15 mg (15 mg Intravenous Bolus from Bag 06/29/17 1812)    And  diltiazem (CARDIZEM) 100 mg in dextrose 5 % 100 mL (1 mg/mL) infusion (5 mg/hr Intravenous New Bag/Given 06/29/17 1811)  LORazepam (ATIVAN) injection 1 mg (1 mg Intravenous Given 06/29/17 1405)  haloperidol lactate (  HALDOL) injection 2 mg (2 mg Intravenous Given 06/29/17 1440)  cefTRIAXone (ROCEPHIN) 1 g in dextrose 5 % 50 mL IVPB (0 g Intravenous Stopped 06/29/17 1756)  haloperidol lactate (HALDOL) injection 5 mg (5 mg Intravenous Given 06/29/17 1905)  metoprolol tartrate (LOPRESSOR) injection 5 mg (5 mg Intravenous Given 06/29/17 2009)     Initial Impression / Assessment and Plan / ED Course  I  have reviewed the triage vital signs and the nursing notes.  Pertinent labs & imaging results that were available during my care of the patient were reviewed by me and considered in my medical decision making (see chart for details).  Clinical Course as of Jun 29 2041  Wed Jun 29, 2017  1716 At this time the patient is calmer, supine, and responsive.  She follows commands accurately.  She is verbally responsive, but unable to express much.  Patient's sister updated on the findings.  [EW]  1741 HR now 153, BP 155/118. Pt alert, confused. Additional meds ordered, to stabilize for admission.  [EW]  2027 Patient continues to be tremulous, and somewhat somnolent, after receiving second dose of Haldol.  Cardiac monitor is difficult to interpret because of persistent tremor.  Apical pulse, by me, was 155.  Therefore she was given a dose of Lopressor, at this time the apical pulse, is 108.  This shows improvement of her rate control.  She continues to be on the Cardizem drip.  [EW]  2037 Blood pressure now 110, heart rate now, 108-112.  [EW]    Clinical Course User Index [EW] Mancel BaleWentz, Nyeisha Goodall, MD     Patient Vitals for the past 24 hrs:  BP Temp Temp src Pulse Resp SpO2 Height Weight  06/29/17 1930 (!) 148/130 - - - 20 100 % - -  06/29/17 1900 140/82 - - 79 (!) 22 100 % - -  06/29/17 1840 113/69 - - - 18 - - -  06/29/17 1818 (!) 101/47 - - (!) 105 16 95 % - -  06/29/17 1815 (!) 100/49 - - - 19 - - -  06/29/17 1728 - 99.3 F (37.4 C) Rectal - - - - -  06/29/17 1600 (!) 140/98 - - (!) 147 19 100 % - -  06/29/17 1540 (!) 102/54 - - (!) 118 (!) 21 100 % - -  06/29/17 1243 134/69 97.7 F (36.5 C) Oral (!) 143 20 99 % - -  06/29/17 1242 - - - - - - 5\' 5"  (1.651 m) 99.8 kg (220 lb)   8:38 PM-Consult complete with hospitalist. Patient case explained and discussed.  She agrees to admit patient for further evaluation and treatment. Call ended at 20: 43   Final Clinical Impressions(s) / ED Diagnoses    Final diagnoses:  Permanent atrial fibrillation (HCC)  Altered mental status, unspecified altered mental status type  Encephalopathy  Urinary tract infection without hematuria, site unspecified  AKI (acute kidney injury) (HCC)    Evaluation consistent with urinary tract infection, with secondary encephalopathy.  Doubt severe sepsis, metabolic instability or impending vascular collapse.  Heart rate elevated, secondary to infection and AKI. Patient requires hospitalization for further treatment.  She has been responsive to sedating medications but has required multiple doses.  She will require close supervision, in a stepdown unit.  Nursing Notes Reviewed/ Care Coordinated Applicable Imaging Reviewed Interpretation of Laboratory Data incorporated into ED treatment   Plan: Admit   New Prescriptions New Prescriptions   No medications on file     Effie ShyWentz,  Mechele Collin, MD 06/29/17 2043

## 2017-06-29 NOTE — ED Notes (Signed)
Pts oxygen drops while intermittently while sleeping.  Switched pulse oximetry to ear to get accurate reading.

## 2017-06-29 NOTE — ED Notes (Signed)
Patient placed in safety mitts, to deter from pulling lines and IV, skin assessment and range of motion performed.

## 2017-06-30 ENCOUNTER — Inpatient Hospital Stay (HOSPITAL_COMMUNITY): Payer: Medicare Other

## 2017-06-30 ENCOUNTER — Encounter (HOSPITAL_COMMUNITY): Payer: Self-pay | Admitting: *Deleted

## 2017-06-30 DIAGNOSIS — N179 Acute kidney failure, unspecified: Secondary | ICD-10-CM

## 2017-06-30 DIAGNOSIS — N183 Chronic kidney disease, stage 3 unspecified: Secondary | ICD-10-CM

## 2017-06-30 DIAGNOSIS — I10 Essential (primary) hypertension: Secondary | ICD-10-CM

## 2017-06-30 DIAGNOSIS — E1169 Type 2 diabetes mellitus with other specified complication: Secondary | ICD-10-CM

## 2017-06-30 DIAGNOSIS — R319 Hematuria, unspecified: Secondary | ICD-10-CM

## 2017-06-30 DIAGNOSIS — E669 Obesity, unspecified: Secondary | ICD-10-CM

## 2017-06-30 DIAGNOSIS — I248 Other forms of acute ischemic heart disease: Secondary | ICD-10-CM

## 2017-06-30 DIAGNOSIS — R6510 Systemic inflammatory response syndrome (SIRS) of non-infectious origin without acute organ dysfunction: Secondary | ICD-10-CM

## 2017-06-30 DIAGNOSIS — N390 Urinary tract infection, site not specified: Secondary | ICD-10-CM

## 2017-06-30 DIAGNOSIS — I4891 Unspecified atrial fibrillation: Secondary | ICD-10-CM

## 2017-06-30 LAB — BASIC METABOLIC PANEL
Anion gap: 9 (ref 5–15)
BUN: 37 mg/dL — ABNORMAL HIGH (ref 6–20)
CALCIUM: 8.5 mg/dL — AB (ref 8.9–10.3)
CO2: 22 mmol/L (ref 22–32)
CREATININE: 1.05 mg/dL — AB (ref 0.44–1.00)
Chloride: 114 mmol/L — ABNORMAL HIGH (ref 101–111)
GFR, EST AFRICAN AMERICAN: 59 mL/min — AB (ref >60)
GFR, EST NON AFRICAN AMERICAN: 51 mL/min — AB (ref >60)
Glucose, Bld: 150 mg/dL — ABNORMAL HIGH (ref 65–99)
Potassium: 3.3 mmol/L — ABNORMAL LOW (ref 3.5–5.1)
SODIUM: 145 mmol/L (ref 135–145)

## 2017-06-30 LAB — MRSA PCR SCREENING: MRSA BY PCR: NEGATIVE

## 2017-06-30 LAB — HEPARIN LEVEL (UNFRACTIONATED)
Heparin Unfractionated: 0.74 IU/mL — ABNORMAL HIGH (ref 0.30–0.70)
Heparin Unfractionated: 1.12 IU/mL — ABNORMAL HIGH (ref 0.30–0.70)
Heparin Unfractionated: 0.8 IU/mL — ABNORMAL HIGH (ref 0.30–0.70)

## 2017-06-30 LAB — GLUCOSE, CAPILLARY
GLUCOSE-CAPILLARY: 133 mg/dL — AB (ref 65–99)
GLUCOSE-CAPILLARY: 154 mg/dL — AB (ref 65–99)
GLUCOSE-CAPILLARY: 156 mg/dL — AB (ref 65–99)
Glucose-Capillary: 124 mg/dL — ABNORMAL HIGH (ref 65–99)
Glucose-Capillary: 115 mg/dL — ABNORMAL HIGH (ref 65–99)
Glucose-Capillary: 128 mg/dL — ABNORMAL HIGH (ref 65–99)

## 2017-06-30 LAB — CBC
HCT: 32.3 % — ABNORMAL LOW (ref 36.0–46.0)
Hemoglobin: 11.2 g/dL — ABNORMAL LOW (ref 12.0–15.0)
MCH: 31 pg (ref 26.0–34.0)
MCHC: 34.7 g/dL (ref 30.0–36.0)
MCV: 89.5 fL (ref 78.0–100.0)
PLATELETS: 357 10*3/uL (ref 150–400)
RBC: 3.61 MIL/uL — ABNORMAL LOW (ref 3.87–5.11)
RDW: 13.3 % (ref 11.5–15.5)
WBC: 14.1 10*3/uL — AB (ref 4.0–10.5)

## 2017-06-30 LAB — APTT: APTT: 106 s — AB (ref 24–36)

## 2017-06-30 LAB — LACTIC ACID, PLASMA: Lactic Acid, Venous: 1.4 mmol/L (ref 0.5–1.9)

## 2017-06-30 LAB — VITAMIN B12: VITAMIN B 12: 4525 pg/mL — AB (ref 180–914)

## 2017-06-30 LAB — AMMONIA: Ammonia: 11 umol/L (ref 9–35)

## 2017-06-30 LAB — TROPONIN I
TROPONIN I: 0.04 ng/mL — AB (ref <0.03)
Troponin I: 0.05 ng/mL (ref <0.03)

## 2017-06-30 LAB — T4, FREE: Free T4: 1.74 ng/dL — ABNORMAL HIGH (ref 0.61–1.12)

## 2017-06-30 MED ORDER — VANCOMYCIN HCL IN DEXTROSE 750-5 MG/150ML-% IV SOLN
750.0000 mg | Freq: Two times a day (BID) | INTRAVENOUS | Status: DC
  Filled 2017-06-30: qty 150

## 2017-06-30 MED ORDER — LORAZEPAM 2 MG/ML IJ SOLN
0.5000 mg | INTRAMUSCULAR | Status: DC | PRN
  Administered 2017-06-30: 0.5 mg via INTRAVENOUS
  Filled 2017-06-30: qty 1

## 2017-06-30 MED ORDER — HEPARIN (PORCINE) IN NACL 100-0.45 UNIT/ML-% IJ SOLN
900.0000 [IU]/h | INTRAMUSCULAR | Status: DC
  Administered 2017-06-30: 900 [IU]/h via INTRAVENOUS
  Administered 2017-07-02: 800 [IU]/h via INTRAVENOUS
  Administered 2017-07-03 – 2017-07-06 (×4): 900 [IU]/h via INTRAVENOUS
  Filled 2017-06-30 (×7): qty 250

## 2017-06-30 MED ORDER — INSULIN ASPART 100 UNIT/ML ~~LOC~~ SOLN
0.0000 [IU] | SUBCUTANEOUS | Status: DC
  Administered 2017-06-30: 2 [IU] via SUBCUTANEOUS
  Administered 2017-06-30: 1 [IU] via SUBCUTANEOUS
  Administered 2017-07-01: 2 [IU] via SUBCUTANEOUS
  Administered 2017-07-01 (×2): 1 [IU] via SUBCUTANEOUS
  Administered 2017-07-01: 2 [IU] via SUBCUTANEOUS
  Administered 2017-07-01: 1 [IU] via SUBCUTANEOUS
  Administered 2017-07-01: 2 [IU] via SUBCUTANEOUS
  Administered 2017-07-02 (×2): 1 [IU] via SUBCUTANEOUS
  Administered 2017-07-02: 2 [IU] via SUBCUTANEOUS
  Administered 2017-07-02: 1 [IU] via SUBCUTANEOUS
  Administered 2017-07-02: 2 [IU] via SUBCUTANEOUS
  Administered 2017-07-02 – 2017-07-03 (×2): 1 [IU] via SUBCUTANEOUS
  Administered 2017-07-03: 2 [IU] via SUBCUTANEOUS
  Administered 2017-07-03: 1 [IU] via SUBCUTANEOUS
  Administered 2017-07-03 – 2017-07-04 (×4): 2 [IU] via SUBCUTANEOUS
  Administered 2017-07-04: 1 [IU] via SUBCUTANEOUS
  Administered 2017-07-04 – 2017-07-05 (×5): 2 [IU] via SUBCUTANEOUS
  Administered 2017-07-05: 1 [IU] via SUBCUTANEOUS
  Administered 2017-07-05: 2 [IU] via SUBCUTANEOUS
  Administered 2017-07-05 (×3): 1 [IU] via SUBCUTANEOUS
  Administered 2017-07-06: 2 [IU] via SUBCUTANEOUS
  Administered 2017-07-06 (×4): 1 [IU] via SUBCUTANEOUS
  Administered 2017-07-06: 2 [IU] via SUBCUTANEOUS
  Administered 2017-07-06 – 2017-07-07 (×4): 1 [IU] via SUBCUTANEOUS
  Administered 2017-07-07: 2 [IU] via SUBCUTANEOUS
  Administered 2017-07-07 – 2017-07-10 (×13): 1 [IU] via SUBCUTANEOUS
  Administered 2017-07-10: 2 [IU] via SUBCUTANEOUS
  Administered 2017-07-11 (×3): 1 [IU] via SUBCUTANEOUS

## 2017-06-30 MED ORDER — ACETAMINOPHEN 650 MG RE SUPP
650.0000 mg | Freq: Four times a day (QID) | RECTAL | Status: DC | PRN
  Administered 2017-06-30: 650 mg via RECTAL
  Filled 2017-06-30: qty 1

## 2017-06-30 MED ORDER — SODIUM CHLORIDE 0.9 % IV BOLUS (SEPSIS)
500.0000 mL | Freq: Once | INTRAVENOUS | Status: AC
  Administered 2017-06-30: 500 mL via INTRAVENOUS

## 2017-06-30 MED ORDER — INSULIN ASPART 100 UNIT/ML ~~LOC~~ SOLN: 0.0000 [IU] | Freq: Every day | SUBCUTANEOUS | Status: DC

## 2017-06-30 MED ORDER — PIPERACILLIN-TAZOBACTAM 3.375 G IVPB
3.3750 g | Freq: Three times a day (TID) | INTRAVENOUS | Status: AC
  Administered 2017-06-30 – 2017-07-03 (×11): 3.375 g via INTRAVENOUS
  Filled 2017-06-30 (×10): qty 50

## 2017-06-30 MED ORDER — POTASSIUM CHLORIDE IN NACL 20-0.45 MEQ/L-% IV SOLN
INTRAVENOUS | Status: DC
  Administered 2017-06-30: 20:00:00 via INTRAVENOUS
  Administered 2017-07-01: 1000 mL via INTRAVENOUS
  Filled 2017-06-30 (×2): qty 1000

## 2017-06-30 NOTE — Progress Notes (Signed)
RN noted that MD ordered PICC placement for pt. Vascular access called to notify of need of placement. Ginger at vascular access to call with an ETA for arrival. Will continue to monitor and follow up.

## 2017-06-30 NOTE — Progress Notes (Signed)
ANTICOAGULATION CONSULT NOTE - follow up  Pharmacy Consult for HEPARIN Indication: atrial fibrillation  No Active Allergies  Patient Measurements: Height: 5\' 6"  (167.6 cm) Weight: 199 lb 4.7 oz (90.4 kg) IBW/kg (Calculated) : 59.3  Vital Signs: Temp: 98.3 F (36.8 C) (07/05 1230) Temp Source: Axillary (07/05 1230) BP: 111/71 (07/05 1330) Pulse Rate: 119 (07/05 1330)  Labs:  Recent Labs  06/29/17 1304 06/29/17 2241 06/30/17 0359 06/30/17 0948 06/30/17 1300  HGB 12.0  --  11.2*  --   --   HCT 34.3*  --  32.3*  --   --   PLT 435*  --  357  --   --   APTT  --  21* 106*  --   --   LABPROT  --  13.9  --   --   --   INR  --  1.07  --   --   --   HEPARINUNFRC  --   --  0.74*  --  1.12*  CREATININE 1.54*  --  1.05*  --   --   TROPONINI 0.03* 0.06* 0.05* 0.04*  --    Estimated Creatinine Clearance: 53.2 mL/min (A) (by C-G formula based on SCr of 1.05 mg/dL (H)).  Medical History: Past Medical History:  Diagnosis Date  . Atrial fibrillation (HCC)   . Diabetes mellitus without complication (HCC)   . High cholesterol   . Hypertension   . Vertigo     Medications:  Prescriptions Prior to Admission  Medication Sig Dispense Refill Last Dose  . apixaban (ELIQUIS) 5 MG TABS tablet Take 1 tablet (5 mg total) by mouth 2 (two) times daily. 60 tablet 1   . cholecalciferol (VITAMIN D) 1000 units tablet Take 1,000 Units by mouth daily.     . meclizine (ANTIVERT) 25 MG tablet Take 25 mg by mouth 4 (four) times daily as needed for dizziness.     . metoprolol tartrate 37.5 MG TABS Take 37.5 mg by mouth 2 (two) times daily. 60 tablet 0   . Multiple Vitamin (MULTIVITAMIN WITH MINERALS) TABS Take 1 tablet by mouth daily.     . ondansetron (ZOFRAN) 4 MG tablet Take 4 mg by mouth 3 (three) times daily.     . vitamin B-12 (CYANOCOBALAMIN) 1000 MCG tablet Take 1,000 mcg by mouth daily.      Marland Kitchen. acetaminophen (TYLENOL) 500 MG tablet Take 500 mg by mouth daily as needed for pain.   unknown  .  atorvastatin (LIPITOR) 40 MG tablet Take 40 mg by mouth every morning.    05/30/2017 at Unknown time  . lisinopril (PRINIVIL,ZESTRIL) 20 MG tablet Take 1 tablet (20 mg total) by mouth daily. 30 tablet 0   . metFORMIN (GLUCOPHAGE-XR) 500 MG 24 hr tablet Take 1 tablet by mouth 2 (two) times daily.   05/30/2017 at Unknown time  . omeprazole (PRILOSEC) 20 MG capsule Take 20 mg by mouth daily.   05/29/2017 at Unknown time   Assessment:  Heparin level above goal, trending up.  Heparin level and aPTT appear to correlate.   Atrial Fibrillation with RVR -doubt medication compliance at time of d/c including metoprolol and apixiban -Continue diltiazem drip -CHADSVASc = 5 (HTN, Age, DM, female) -TSH 0.416 -05/31/2017 Echo EF 60-65%, mild MR, trivial TR, PASP 30 -continue IV heparin until pt able to take po reliably  Goal of Therapy:  Heparin level 0.3-0.7 units/ml Monitor platelets by anticoagulation protocol: Yes   Plan:   HOLD Heparin x 1 hour then  reduce rate to 900 units/hr  Recheck heparin level in 6 hrs  Heparin level and CBC daily  Margo Aye, Jazmin Ley A 06/30/2017,2:33 PM

## 2017-06-30 NOTE — Plan of Care (Signed)
Problem: Safety: Goal: Ability to remain free from injury will improve Outcome: Progressing bedrails up x 3, yellow socks, call bell within reach   Problem: Physical Regulation: Goal: Ability to maintain clinical measurements within normal limits will improve Outcome: Progressing Pt is on cardizem drip to try and convert HR back to NSR Goal: Will remain free from infection Outcome: Progressing Pt already has UTI and is on antibiotic to get rid of infection already present  Problem: Skin Integrity: Goal: Risk for impaired skin integrity will decrease Outcome: Progressing Pt on low air loss/rotation bed, turn Q2 hours  Problem: Tissue Perfusion: Goal: Risk factors for ineffective tissue perfusion will decrease Outcome: Progressing Pt is on low air loss/rotation bed. Turn Q2 hours

## 2017-06-30 NOTE — Consult Note (Signed)
Cardiology Consultation:   Patient ID: Meridee ScoreFannie L Degner; [id number]; [birth year]   Admit date: 06/29/2017 Date of Consult: 06/30/2017  Primary Care Provider: Pearson GrippeKim, James, MD Primary Cardiologist: New    Patient Profile:   Meridee ScoreFannie L Gidley is a 76 y.o. female with a hx of Atrial fibrillation, with hospitalization in 05/30/2017 through 05/31/2017, but not seen by cardiology during that admission, patient also has history of hypertension, hyperlipidemia, diabetes, and hypomagnesemia. who is being seen today for the evaluation of atrial fibrillation RVR, at the request of Dr. Arbutus Leasat, Hospitalist service.   History of Present Illness:   Ms. Melissa NoonDameron with recent hospitalization in June 2018 at which time she was found to have atrial fibrillation with RVR, she was not seen by cardiology during the time of her admission, and was sent home on metoprolol and ELIQUIS with plans to follow and see cardiology as an outpatient.  She was brought to the emergency room due to altered mental status, after sister called 911 in the setting of worsening confusion. There was uncertainty whether or not the patient was taking her medications at home. Apparently she had defecated on herself and in her bed according to her sister.  On arrival to the emergency room the patient was found to be in A. fib RVR heart rate 143 bpm, blood pressure 134/69, O2 sat 99%, she was afebrile. Per the labs revealed leukocytosis with white blood cells 14.8, hemoglobin 12.0, hematocrit 34.3. She was found to be hypokalemic with potassium of 3.4, glucose 183, creatinine 1.54. Troponin 0.03. EKG revealed atrial fibrillation with RVR, rate in the 144 bpm. Lactic acid 1.59.  UA showed moderate leukocytes and many bacteria.  ECG which I personally interpreted shows rapid atrial fibrillation, 143 bpm.  She was treated with IV fluids, given Haldol, started on Rocephin, given diltiazem bolus, and begun on a drip, transferred to ICU.   History  obtained from chart and from nurse as patient is confused and lethargic.  She remains in rapid atrial fibrillation, HR 120-130 bpm. Diltiazem infusion just increased to 12.5 mg/hr.   Past Medical History:  Diagnosis Date  . Atrial fibrillation (HCC)   . Diabetes mellitus without complication (HCC)   . High cholesterol   . Hypertension   . Vertigo     Past Surgical History:  Procedure Laterality Date  . ABDOMINAL HYSTERECTOMY    . CHOLECYSTECTOMY    . CYST EXCISION       Inpatient Medications: Scheduled Meds: . insulin aspart  0-5 Units Subcutaneous QHS  . insulin aspart  0-9 Units Subcutaneous Q4H   Continuous Infusions: . sodium chloride 1,000 mL (06/30/17 1000)  . diltiazem (CARDIZEM) infusion 10 mg/hr (06/30/17 1248)  . heparin 1,100 Units/hr (06/30/17 1000)  . piperacillin-tazobactam (ZOSYN)  IV Stopped (06/30/17 1208)   PRN Meds: acetaminophen, ondansetron (ZOFRAN) IV  Allergies:   No Active Allergies  Social History:   Social History   Social History  . Marital status: Divorced    Spouse name: N/A  . Number of children: N/A  . Years of education: N/A   Occupational History  . retired    Social History Main Topics  . Smoking status: Never Smoker  . Smokeless tobacco: Never Used  . Alcohol use No  . Drug use: No  . Sexual activity: Not on file   Other Topics Concern  . Not on file   Social History Narrative  . No narrative on file    Family History:   The patient's  family history includes CAD in her father; CVA (age of onset: 60) in her mother; Diabetes in her sister.  ROS:  Please see the history of present illness.  ROS  Unable to be obtained as patient is lethargic and confused.  Physical Exam/Data:   Vitals:   06/30/17 1230 06/30/17 1245 06/30/17 1300 06/30/17 1330  BP: 133/81 129/78 (!) 146/100 111/71  Pulse: (!) 118 (!) 122 (!) 119 (!) 119  Resp: (!) 26 20 (!) 30 (!) 29  Temp: 98.3 F (36.8 C)     TempSrc: Axillary     SpO2:  100% 92% 100% 100%  Weight:      Height:        Intake/Output Summary (Last 24 hours) at 06/30/17 1405 Last data filed at 06/30/17 1300  Gross per 24 hour  Intake          2470.62 ml  Output              650 ml  Net          1820.62 ml   Filed Weights   06/29/17 1242 06/29/17 2234 06/30/17 0500  Weight: 220 lb (99.8 kg) 199 lb 4.7 oz (90.4 kg) 199 lb 4.7 oz (90.4 kg)   Body mass index is 32.17 kg/m.  General:  Elderly ill female HEENT: NCAT Lymph: no adenopathy Neck: no JVD Endocrine:  No thryomegaly Cardiac: Tachycardic, irregular, normal S1S2 Lungs:  clear to auscultation anteriorly Abd: soft Ext: no edema Musculoskeletal:  No deformities Skin: warm and dry   Telemetry:  Telemetry was personally reviewed and demonstrates: Rapid atrial fibrillation  Relevant CV Studies: Echo 05/31/17  - Left ventricle: The cavity size was normal. Wall thickness was at   the upper limits of normal. Systolic function was normal. The   estimated ejection fraction was in the range of 60% to 65%. Wall   motion was normal; there were no regional wall motion   abnormalities. The study is not technically sufficient to allow   evaluation of LV diastolic function. - Aortic valve: Mildly calcified annulus. Trileaflet. - Mitral valve: Calcified annulus. There was mild regurgitation. - Left atrium: The atrium was mildly to moderately dilated. - Right atrium: Central venous pressure (est): 8 mm Hg. - Atrial septum: No defect or patent foramen ovale was identified. - Tricuspid valve: There was trivial regurgitation. - Pulmonary arteries: PA peak pressure: 30 mm Hg (S). - Pericardium, extracardiac: There was no pericardial effusion.  Impressions:  - Upper normal LV wall thickness with LVEF 60-65%. Indeterminate   diastolic function. Mild to moderate left atrial enlargement.   Mildly calcified mitral annulus with mild mitral regurgitation.   Mildly sclerotic aortic valve. Trivial tricuspid  regurgitation   with PASP estimated 30 mmHg.  Laboratory Data:  Chemistry Recent Labs Lab 06/29/17 1304 06/30/17 0359  NA 144 145  K 3.4* 3.3*  CL 105 114*  CO2 20* 22  GLUCOSE 183* 150*  BUN 51* 37*  CREATININE 1.54* 1.05*  CALCIUM 10.2 8.5*  GFRNONAA 32* 51*  GFRAA 37* 59*  ANIONGAP 19* 9     Recent Labs Lab 06/29/17 1304  PROT 7.2  ALBUMIN 4.1  AST 21  ALT 20  ALKPHOS 65  BILITOT 1.5*   Hematology Recent Labs Lab 06/29/17 1304 06/30/17 0359  WBC 14.8* 14.1*  RBC 3.85* 3.61*  HGB 12.0 11.2*  HCT 34.3* 32.3*  MCV 89.1 89.5  MCH 31.2 31.0  MCHC 35.0 34.7  RDW 13.3 13.3  PLT 435*  357   Cardiac Enzymes Recent Labs Lab 06/29/17 1304 06/29/17 2241 06/30/17 0359 06/30/17 0948  TROPONINI 0.03* 0.06* 0.05* 0.04*   No results for input(s): TROPIPOC in the last 168 hours.  BNPNo results for input(s): BNP, PROBNP in the last 168 hours.  DDimer No results for input(s): DDIMER in the last 168 hours.  Radiology/Studies:  Ct Head Wo Contrast  Result Date: 06/29/2017 CLINICAL DATA:  Confusion for 1 week. EXAM: CT HEAD WITHOUT CONTRAST TECHNIQUE: Contiguous axial images were obtained from the base of the skull through the vertex without intravenous contrast. COMPARISON:  Head CT scan 05/30/2017 in 03/06/2015. FINDINGS: Brain: There is some chronic microvascular ischemic change. No evidence of acute intracranial abnormality including hemorrhage, infarct, mass lesion, mass effect, midline shift or abnormal extra-axial fluid collection. No hydrocephalus or pneumocephalus. Vascular: Atherosclerosis noted. Skull: Intact. Sinuses/Orbits: Negative. Other: None. IMPRESSION: No acute abnormality. Atherosclerosis. Mild appearing chronic microvascular ischemic change. Electronically Signed   By: Drusilla Kanner M.D.   On: 06/29/2017 16:25   Dg Chest Port 1 View  Result Date: 06/30/2017 CLINICAL DATA:  Hypoxia EXAM: PORTABLE CHEST 1 VIEW COMPARISON:  June 29, 2017 FINDINGS: There  is no edema or consolidation. The heart size and pulmonary vascularity are normal. No adenopathy. There is aortic atherosclerosis. No evident bone lesions. IMPRESSION: Aortic atherosclerosis.  No edema or consolidation. Aortic Atherosclerosis (ICD10-I70.0). Electronically Signed   By: Bretta Bang III M.D.   On: 06/30/2017 10:02   Dg Chest Port 1 View  Result Date: 06/29/2017 CLINICAL DATA:  Altered mental status. EXAM: PORTABLE CHEST 1 VIEW COMPARISON:  Abdominal series on 05/30/2017 FINDINGS: The heart size and mediastinal contours are within normal limits. Lung volumes are low bilaterally. There is no evidence of pulmonary edema, consolidation, pneumothorax, nodule or pleural fluid. The visualized skeletal structures are unremarkable. IMPRESSION: No active disease. Electronically Signed   By: Irish Lack M.D.   On: 06/29/2017 13:25   Dg Chest Port 1v Same Day  Result Date: 06/30/2017 CLINICAL DATA:  PICC line insertion. EXAM: PORTABLE CHEST 1 VIEW COMPARISON:  06/30/2017 . FINDINGS: PICC line noted with its tip projected over the SVC. Heart size normal. Low lung volumes with basilar atelectasis. No pleural effusion pneumothorax. IMPRESSION: PICC line noted with its tip projected over the SVC. Electronically Signed   By: Maisie Fus  Register   On: 06/30/2017 11:55    Assessment and Plan:   1. Rapid atrial fibrillation: Due to urosepsis. Diltiazem infusion just increased to 12.5 mg/hr. I told nurse to increase as needed. Currently receiving IV fluids. She is normotensive. Once she is adequately fluid repleted and antimicrobials are able to control infection, HR will able to be better controlled. On IV heparin for anticoagulation. Free T4 also elevated which would contribute to this.  2. Urosepsis: Currently on Zosyn. Already got doses of vancomycin and ceftriaxone at time of initial presentation. On IV fluids.  3. Altered mental status/acute metabolic encephalopathy: Due to urosepsis. Head CT  with chronic microvascular ischemic changes. Continue IV fluids and Zosyn.  4. Troponin elevation: Due to demand ischemia from urosepsis and rapid atrial fibrillation.

## 2017-06-30 NOTE — Progress Notes (Signed)
Pharmacy Antibiotic Note  Deborah Benjamin is Benjamin 76 y.o. female admitted on 06/29/2017 with sepsis.  Pharmacy has been consulted for Vancomycin and Zosyn dosing.  Also asked to initiate Heparin for afib.  Pt reportedly on Eliquis PTA but last dose not recorded.  INR at baseline suggests no Eliquis recently.  Initial heparin level slightly above goal.  Plan:  Vancomycin 750mg  IV q12hrs  Zosyn 3.375gm IV q8h, EID Decrease Heparin infusion to 1100 units/hr, recheck level in ~6hrs Monitor labs, renal fxn, progress and c/s Deescalate ABX when improved / appropriate.   Heparin level, aPTT, CBC in am  Height: 5\' 6"  (167.6 cm) Weight: 199 lb 4.7 oz (90.4 kg) IBW/kg (Calculated) : 59.3  Temp (24hrs), Avg:98.3 F (36.8 C), Min:97.7 F (36.5 C), Max:99.3 F (37.4 C)   Recent Labs Lab 06/29/17 1304 06/29/17 1316 06/29/17 2241 06/30/17 0359  WBC 14.8*  --   --  14.1*  CREATININE 1.54*  --   --  1.05*  LATICACIDVEN  --  1.59 1.1 1.4    Estimated Creatinine Clearance: 53.2 mL/min (Benjamin) (by C-G formula based on SCr of 1.05 mg/dL (H)).    No Active Allergies  Antimicrobials this admission: Vancomycin 7/4 >>  Zosyn 7/4 >>   Dose adjustments this admission:  Microbiology results:  BCx: pending  UCx:  pending  Sputum:    MRSA PCR:   Thank you for allowing pharmacy to be Benjamin part of this patient's care.  Deborah Benjamin, Deborah Benjamin 06/30/2017 7:40 AM

## 2017-06-30 NOTE — Progress Notes (Signed)
PROGRESS NOTE  Deborah Benjamin:[id number] DOB: June 23, 1941 DOA: 06/29/2017 PCP: Pearson Grippe, MD  Brief History:  76 year old female with a history of hypertension, hyperlipidemia, diabetes mellitus, and recently diagnosed atrial fibrillation presented with altered mental status. According to the patient's sister, the patient was "not right" at the time of her last hospital discharge. The patient was admitted to the hospital from 05/30/2017 through 05/31/2017 at which time, the patient was found to have new onset atrial fibrillation. The patient was discharged home with metoprolol and apixiban. Since discharge from the hospital, the patient has had a waxing and waning episodes of confusion which has worsened in the 24 hours prior to coming back to the emergency department. According to the patient's sister, the patient was "awful". In the emergency department, the patient was noted to have a low-grade temperature 99.3 with intermittent episodes of hypotension. She was found have atrial fibrillation with RVR with heart rate 143. WBC was 14.8 with lactic acid 1.59. In addition, her serum creatinine was elevated beyond her normal baseline. Yesterday when the test is visually significant baseline cortisol from the Cortrosyn revealed a cortisol test for cortisol at 30 minutes at 60 minutes of issues. He'll have a redundant stool in the morning  Assessment/Plan: Acute metabolic encephalopathy -Multifactorial including atrial fibrillation, AKI, and possible underlying infectious process -concerned she may have previously undocumented cognitive impairment -CT brain negative -UA 6-30 WBC--follow culture -Check serum B12, ammonia -TSH 0.416  Atrial Fibrillation with RVR -doubt medication compliance at time of d/c including metoprolol and apixiban -Continue diltiazem drip -CHADSVASc = 5 (HTN, Age, DM, female) -TSH 0.416 -05/31/2017 Echo EF 60-65%, mild MR, trivial TR, PASP 30 -continue IV  heparin until pt able to take po reliably  Acute on chronic renal failure--CKD stage 3 -Secondary to volume depletion -Baseline creatinine 0.8-1.1  -Presenting creatinine 1.54 -Improved with IV fluids  -Saline lock IV fluids as the patient appears euvolemic at this time  -holding lisinopril  SIRS -continue zosyn pending culture data -repeat CXR (after hydration)  Pyuria  -Continue Zosyn pending culture data   Essential hypertension  -The patient had intermittent episodes of hypotension which may been rate related due to her atrial fibrillation  -Blood pressure is improved and acceptable on diltiazem drip  -Doubt medication compliance after her last discharge  Lower extremity pain and edema  -Venous duplex r/o DVT  Diabetes mellitus type 2  -Holding metformin  -05/30/2017 hemoglobin A1c 6.6  -novolog sliding scale  Elevated troponin -due to demand ischemia -personally reviewed EKG--no ST-T wave changes      Disposition Plan:   Remain in SDU Family Communication:  No Family at bedside  Consultants:  none  Code Status:   DNR  DVT Prophylaxis:  [place] Heparin / [place] Lovenox   Procedures: As Listed in Progress Note Above  Antibiotics: vanco 7/4>>>7/5 Zosyn 7/4>>>    Subjective: Patient is confused and unable to provide any history at this time. No reports of respiratory distress, vomiting, diarrhea, uncontrolled pain overnight.   Objective: Vitals:   06/30/17 0015 06/30/17 0030 06/30/17 0400 06/30/17 0500  BP: (!) 110/51 111/64    Pulse: (!) 123 90    Resp: (!) 21 19    Temp:   97.9 F (36.6 C)   TempSrc:   Axillary   SpO2: 90% 100%    Weight:    90.4 kg (199 lb 4.7 oz)  Height:  Intake/Output Summary (Last 24 hours) at 06/30/17 0748 Last data filed at 06/30/17 0500  Gross per 24 hour  Intake                0 ml  Output              650 ml  Net             -650 ml   Weight change:  Exam:   General:  Pt is alert, does not follow commands  appropriately, not in acute distress  HEENT: No icterus, No thrush, No neck mass, /AT  Cardiovascular: IRRR, S1/S2, no rubs, no gallops  Respiratory: Bibasilar crackles. No wheezing. Good air movement.  Abdomen: Soft/+BS, non tender, non distended, no guarding  Extremities: 1+LE edema, No lymphangitis, No petechiae, No rashes, no synovitis   Data Reviewed: I have personally reviewed following labs and imaging studies Basic Metabolic Panel:  Recent Labs Lab 06/29/17 1304 06/30/17 0359  NA 144 145  K 3.4* 3.3*  CL 105 114*  CO2 20* 22  GLUCOSE 183* 150*  BUN 51* 37*  CREATININE 1.54* 1.05*  CALCIUM 10.2 8.5*   Liver Function Tests:  Recent Labs Lab 06/29/17 1304  AST 21  ALT 20  ALKPHOS 65  BILITOT 1.5*  PROT 7.2  ALBUMIN 4.1   No results for input(s): LIPASE, AMYLASE in the last 168 hours. No results for input(s): AMMONIA in the last 168 hours. Coagulation Profile:  Recent Labs Lab 06/29/17 2241  INR 1.07   CBC:  Recent Labs Lab 06/29/17 1304 06/30/17 0359  WBC 14.8* 14.1*  NEUTROABS 13.0*  --   HGB 12.0 11.2*  HCT 34.3* 32.3*  MCV 89.1 89.5  PLT 435* 357   Cardiac Enzymes:  Recent Labs Lab 06/29/17 1304 06/29/17 2241 06/30/17 0359  TROPONINI 0.03* 0.06* 0.05*   BNP: Invalid input(s): POCBNP CBG:  Recent Labs Lab 06/30/17 0004 06/30/17 0414 06/30/17 0728  GLUCAP 154* 133* 124*   HbA1C: No results for input(s): HGBA1C in the last 72 hours. Urine analysis:    Component Value Date/Time   COLORURINE AMBER (A) 06/29/2017 1342   APPEARANCEUR HAZY (A) 06/29/2017 1342   LABSPEC 1.019 06/29/2017 1342   PHURINE 5.0 06/29/2017 1342   GLUCOSEU NEGATIVE 06/29/2017 1342   HGBUR MODERATE (A) 06/29/2017 1342   BILIRUBINUR NEGATIVE 06/29/2017 1342   KETONESUR 5 (A) 06/29/2017 1342   PROTEINUR 30 (A) 06/29/2017 1342   UROBILINOGEN 0.2 03/06/2015 1213   NITRITE NEGATIVE 06/29/2017 1342   LEUKOCYTESUR MODERATE (A) 06/29/2017 1342    Sepsis Labs: @LABRCNTIP (procalcitonin:4,lacticidven:4) ) Recent Results (from the past 240 hour(s))  Blood Culture (routine x 2)     Status: None (Preliminary result)   Collection Time: 06/29/17  1:35 PM  Result Value Ref Range Status   Specimen Description BLOOD RIGHT WRIST  Final   Special Requests   Final    BOTTLES DRAWN AEROBIC AND ANAEROBIC Blood Culture results may not be optimal due to an inadequate volume of blood received in culture bottles   Culture NO GROWTH <12 HOURS  Final   Report Status PENDING  Incomplete  Culture, blood (Routine X 2) w Reflex to ID Panel     Status: None (Preliminary result)   Collection Time: 06/29/17  3:55 PM  Result Value Ref Range Status   Specimen Description BLOOD LEFT HAND  Final   Special Requests   Final    Blood Culture results may not be optimal due to an  inadequate volume of blood received in culture bottles   Culture PENDING  Incomplete   Report Status PENDING  Incomplete  MRSA PCR Screening     Status: None   Collection Time: 06/29/17 10:05 PM  Result Value Ref Range Status   MRSA by PCR NEGATIVE NEGATIVE Final    Comment:        The GeneXpert MRSA Assay (FDA approved for NASAL specimens only), is one component of a comprehensive MRSA colonization surveillance program. It is not intended to diagnose MRSA infection nor to guide or monitor treatment for MRSA infections.      Scheduled Meds: . insulin aspart  0-15 Units Subcutaneous TID WC  . insulin aspart  0-5 Units Subcutaneous QHS   Continuous Infusions: . sodium chloride 1,000 mL (06/29/17 1551)  . diltiazem (CARDIZEM) infusion 7.5 mg/hr (06/29/17 2337)  . heparin 1,200 Units/hr (06/29/17 2328)  . vancomycin      Procedures/Studies: Ct Head Wo Contrast  Result Date: 06/29/2017 CLINICAL DATA:  Confusion for 1 week. EXAM: CT HEAD WITHOUT CONTRAST TECHNIQUE: Contiguous axial images were obtained from the base of the skull through the vertex without intravenous  contrast. COMPARISON:  Head CT scan 05/30/2017 in 03/06/2015. FINDINGS: Brain: There is some chronic microvascular ischemic change. No evidence of acute intracranial abnormality including hemorrhage, infarct, mass lesion, mass effect, midline shift or abnormal extra-axial fluid collection. No hydrocephalus or pneumocephalus. Vascular: Atherosclerosis noted. Skull: Intact. Sinuses/Orbits: Negative. Other: None. IMPRESSION: No acute abnormality. Atherosclerosis. Mild appearing chronic microvascular ischemic change. Electronically Signed   By: Drusilla Kannerhomas  Dalessio M.D.   On: 06/29/2017 16:25   Dg Chest Port 1 View  Result Date: 06/29/2017 CLINICAL DATA:  Altered mental status. EXAM: PORTABLE CHEST 1 VIEW COMPARISON:  Abdominal series on 05/30/2017 FINDINGS: The heart size and mediastinal contours are within normal limits. Lung volumes are low bilaterally. There is no evidence of pulmonary edema, consolidation, pneumothorax, nodule or pleural fluid. The visualized skeletal structures are unremarkable. IMPRESSION: No active disease. Electronically Signed   By: Irish LackGlenn  Yamagata M.D.   On: 06/29/2017 13:25    Shaheim Mahar, DO  [facility] Pager [phone]  If 7PM-7AM, please contact night-coverage [email or web address] Password [email or web address] 06/30/2017, 7:48 AM   LOS: 1 day

## 2017-07-01 ENCOUNTER — Inpatient Hospital Stay (HOSPITAL_COMMUNITY): Payer: Medicare Other

## 2017-07-01 ENCOUNTER — Encounter (HOSPITAL_COMMUNITY): Payer: Self-pay | Admitting: Primary Care

## 2017-07-01 DIAGNOSIS — N390 Urinary tract infection, site not specified: Secondary | ICD-10-CM

## 2017-07-01 DIAGNOSIS — Z515 Encounter for palliative care: Secondary | ICD-10-CM

## 2017-07-01 DIAGNOSIS — Z7189 Other specified counseling: Secondary | ICD-10-CM

## 2017-07-01 LAB — MAGNESIUM: Magnesium: 1.1 mg/dL — ABNORMAL LOW (ref 1.7–2.4)

## 2017-07-01 LAB — CBC
HEMATOCRIT: 30.4 % — AB (ref 36.0–46.0)
HEMOGLOBIN: 10.5 g/dL — AB (ref 12.0–15.0)
MCH: 31.1 pg (ref 26.0–34.0)
MCHC: 34.5 g/dL (ref 30.0–36.0)
MCV: 89.9 fL (ref 78.0–100.0)
PLATELETS: 351 10*3/uL (ref 150–400)
RBC: 3.38 MIL/uL — AB (ref 3.87–5.11)
RDW: 13.7 % (ref 11.5–15.5)
WBC: 11.1 10*3/uL — AB (ref 4.0–10.5)

## 2017-07-01 LAB — GLUCOSE, CAPILLARY
GLUCOSE-CAPILLARY: 158 mg/dL — AB (ref 65–99)
GLUCOSE-CAPILLARY: 149 mg/dL — AB (ref 65–99)
GLUCOSE-CAPILLARY: 160 mg/dL — AB (ref 65–99)
GLUCOSE-CAPILLARY: 131 mg/dL — AB (ref 65–99)
Glucose-Capillary: 122 mg/dL — ABNORMAL HIGH (ref 65–99)
Glucose-Capillary: 160 mg/dL — ABNORMAL HIGH (ref 65–99)

## 2017-07-01 LAB — URINE CULTURE

## 2017-07-01 LAB — BASIC METABOLIC PANEL
ANION GAP: 11 (ref 5–15)
BUN: 27 mg/dL — ABNORMAL HIGH (ref 6–20)
CHLORIDE: 116 mmol/L — AB (ref 101–111)
CO2: 20 mmol/L — ABNORMAL LOW (ref 22–32)
Calcium: 8.5 mg/dL — ABNORMAL LOW (ref 8.9–10.3)
Creatinine, Ser: 1.18 mg/dL — ABNORMAL HIGH (ref 0.44–1.00)
GFR calc Af Amer: 51 mL/min — ABNORMAL LOW (ref >60)
GFR, EST NON AFRICAN AMERICAN: 44 mL/min — AB (ref >60)
GLUCOSE: 172 mg/dL — AB (ref 65–99)
POTASSIUM: 2.8 mmol/L — AB (ref 3.5–5.1)
Sodium: 147 mmol/L — ABNORMAL HIGH (ref 135–145)

## 2017-07-01 LAB — HEPARIN LEVEL (UNFRACTIONATED): Heparin Unfractionated: 0.59 IU/mL (ref 0.30–0.70)

## 2017-07-01 MED ORDER — AMIODARONE IV BOLUS ONLY 150 MG/100ML
150.0000 mg | Freq: Once | INTRAVENOUS | Status: AC
  Administered 2017-07-01: 150 mg via INTRAVENOUS
  Filled 2017-07-01: qty 100

## 2017-07-01 MED ORDER — METOPROLOL TARTRATE 5 MG/5ML IV SOLN: 5.0000 mg | Freq: Once | INTRAVENOUS | Status: AC

## 2017-07-01 MED ORDER — SODIUM CHLORIDE 0.45 % IV SOLN
INTRAVENOUS | Status: DC
  Administered 2017-07-01 – 2017-07-02 (×2): via INTRAVENOUS
  Filled 2017-07-01 (×6): qty 1000

## 2017-07-01 MED ORDER — METOPROLOL TARTRATE 5 MG/5ML IV SOLN
INTRAVENOUS | Status: AC
  Administered 2017-07-01: 5 mg
  Filled 2017-07-01: qty 5

## 2017-07-01 MED ORDER — POTASSIUM CHLORIDE 10 MEQ/100ML IV SOLN
10.0000 meq | INTRAVENOUS | Status: DC
  Administered 2017-07-01: 10 meq via INTRAVENOUS
  Filled 2017-07-01 (×2): qty 100

## 2017-07-01 MED ORDER — MAGNESIUM SULFATE 4 GM/100ML IV SOLN
4.0000 g | Freq: Once | INTRAVENOUS | Status: AC
Start: 2017-07-01 — End: 2017-07-01
  Administered 2017-07-01: 4 g via INTRAVENOUS
  Filled 2017-07-01: qty 100

## 2017-07-01 MED ORDER — POTASSIUM CHLORIDE 10 MEQ/100ML IV SOLN
10.0000 meq | INTRAVENOUS | Status: AC
  Administered 2017-07-01 (×3): 10 meq via INTRAVENOUS
  Filled 2017-07-01: qty 100

## 2017-07-01 MED ORDER — AMIODARONE HCL 150 MG/3ML IV SOLN: 150.0000 mg | Freq: Once | INTRAVENOUS | Status: DC

## 2017-07-01 MED ORDER — NYSTATIN 100000 UNIT/GM EX POWD
Freq: Three times a day (TID) | CUTANEOUS | Status: DC
  Administered 2017-07-01: 22:00:00 via TOPICAL
  Administered 2017-07-02: 1 g via TOPICAL
  Administered 2017-07-02 (×2): via TOPICAL
  Administered 2017-07-03: 1 via TOPICAL
  Administered 2017-07-03 – 2017-07-11 (×24): via TOPICAL
  Filled 2017-07-01 (×3): qty 15

## 2017-07-01 NOTE — Progress Notes (Signed)
Patient taken down to MRI for scan of head. Told had MRI pump but could not work pump. Patient was off all medications for about 35 minutes. Monitor was very sensitive to movement and of the machine with no heart identifiable rhythm. Tolerated well and returned to room and placed back on wall monitor.

## 2017-07-01 NOTE — Progress Notes (Signed)
Progress Note  Patient Name: Deborah Benjamin Date of Encounter: 07/01/2017  Primary Cardiologist: Purvis Sheffield (new)  Subjective   Lethargic.  Inpatient Medications    Scheduled Meds: . insulin aspart  0-9 Units Subcutaneous Q4H   Continuous Infusions: . 0.45 % NaCl with KCl 20 mEq / L 75 mL/hr at 06/30/17 2027  . diltiazem (CARDIZEM) infusion 15 mg/hr (07/01/17 0900)  . heparin 800 Units/hr (06/30/17 2148)  . piperacillin-tazobactam (ZOSYN)  IV 3.375 g (07/01/17 0755)   PRN Meds: acetaminophen, acetaminophen, LORazepam, ondansetron (ZOFRAN) IV   Vital Signs    Vitals:   07/01/17 0500 07/01/17 0600 07/01/17 0700 07/01/17 0800  BP: (!) 138/100 (!) 141/93 (!) 166/106 (!) 151/114  Pulse: (!) 128 (!) 132 (!) 145 (!) 145  Resp: (!) 26 (!) 32 (!) 28 (!) 25  Temp:    99.5 F (37.5 C)  TempSrc:    Axillary  SpO2: 92% 100% 100% 100%  Weight: 203 lb 4.2 oz (92.2 kg)     Height:        Intake/Output Summary (Last 24 hours) at 07/01/17 0935 Last data filed at 07/01/17 4401  Gross per 24 hour  Intake          2710.91 ml  Output             1400 ml  Net          1310.91 ml   Filed Weights   06/29/17 2234 06/30/17 0500 07/01/17 0500  Weight: 199 lb 4.7 oz (90.4 kg) 199 lb 4.7 oz (90.4 kg) 203 lb 4.2 oz (92.2 kg)    Telemetry    Rapid atrial fibrillation - Personally Reviewed  ECG      Physical Exam   GEN: Lethargic, elderly ill female Neck: No JVD Cardiac: Tachycardic, irregular rhythm, normal S1S2  Respiratory: Clear to auscultation anteriorly GI: Soft, non-distended  MS: No edema; No deformity. Neuro:  Lethargic Psych: Lethargic Skin: Warm and dry  Labs    Chemistry Recent Labs Lab 06/29/17 1304 06/30/17 0359 07/01/17 0516  NA 144 145 147*  K 3.4* 3.3* 2.8*  CL 105 114* 116*  CO2 20* 22 20*  GLUCOSE 183* 150* 172*  BUN 51* 37* 27*  CREATININE 1.54* 1.05* 1.18*  CALCIUM 10.2 8.5* 8.5*  PROT 7.2  --   --   ALBUMIN 4.1  --   --   AST 21  --    --   ALT 20  --   --   ALKPHOS 65  --   --   BILITOT 1.5*  --   --   GFRNONAA 32* 51* 44*  GFRAA 37* 59* 51*  ANIONGAP 19* 9 11     Hematology Recent Labs Lab 06/29/17 1304 06/30/17 0359 07/01/17 0516  WBC 14.8* 14.1* 11.1*  RBC 3.85* 3.61* 3.38*  HGB 12.0 11.2* 10.5*  HCT 34.3* 32.3* 30.4*  MCV 89.1 89.5 89.9  MCH 31.2 31.0 31.1  MCHC 35.0 34.7 34.5  RDW 13.3 13.3 13.7  PLT 435* 357 351    Cardiac Enzymes Recent Labs Lab 06/29/17 1304 06/29/17 2241 06/30/17 0359 06/30/17 0948  TROPONINI 0.03* 0.06* 0.05* 0.04*   No results for input(s): TROPIPOC in the last 168 hours.   BNPNo results for input(s): BNP, PROBNP in the last 168 hours.   DDimer No results for input(s): DDIMER in the last 168 hours.   Radiology    Ct Head Wo Contrast  Result Date: 06/29/2017 CLINICAL DATA:  Confusion  for 1 week. EXAM: CT HEAD WITHOUT CONTRAST TECHNIQUE: Contiguous axial images were obtained from the base of the skull through the vertex without intravenous contrast. COMPARISON:  Head CT scan 05/30/2017 in 03/06/2015. FINDINGS: Brain: There is some chronic microvascular ischemic change. No evidence of acute intracranial abnormality including hemorrhage, infarct, mass lesion, mass effect, midline shift or abnormal extra-axial fluid collection. No hydrocephalus or pneumocephalus. Vascular: Atherosclerosis noted. Skull: Intact. Sinuses/Orbits: Negative. Other: None. IMPRESSION: No acute abnormality. Atherosclerosis. Mild appearing chronic microvascular ischemic change. Electronically Signed   By: Drusilla Kannerhomas  Dalessio M.D.   On: 06/29/2017 16:25   [place] Venous Img Lower Bilateral  Result Date: 06/30/2017 CLINICAL DATA:  Bilateral lower extremity pain and edema EXAM: BILATERAL LOWER EXTREMITY VENOUS DOPPLER ULTRASOUND TECHNIQUE: Gray-scale sonography with graded compression, as well as color Doppler and duplex ultrasound were performed to evaluate the lower extremity deep venous systems from the level of  the common femoral vein and including the common femoral, femoral, profunda femoral, popliteal and calf veins including the posterior tibial, peroneal and gastrocnemius veins when visible. The superficial great saphenous vein was also interrogated. Spectral Doppler was utilized to evaluate flow at rest and with distal augmentation maneuvers in the common femoral, femoral and popliteal veins. COMPARISON:  None. FINDINGS: RIGHT LOWER EXTREMITY Common Femoral Vein: No evidence of thrombus. Normal compressibility, respiratory phasicity and response to augmentation. Saphenofemoral Junction: No evidence of thrombus. Normal compressibility and flow on color Doppler imaging. Profunda Femoral Vein: No evidence of thrombus. Normal compressibility and flow on color Doppler imaging. Femoral Vein: No evidence of thrombus. Normal compressibility, respiratory phasicity and response to augmentation. Popliteal Vein: No evidence of thrombus. Normal compressibility, respiratory phasicity and response to augmentation. Calf Veins: No evidence of thrombus. Normal compressibility and flow on color Doppler imaging. Superficial Great Saphenous Vein: No evidence of thrombus. Normal compressibility and flow on color Doppler imaging. Marland Kitchen. LEFT LOWER EXTREMITY Common Femoral Vein: No evidence of thrombus. Normal compressibility, respiratory phasicity and response to augmentation. Saphenofemoral Junction: No evidence of thrombus. Normal compressibility and flow on color Doppler imaging. Profunda Femoral Vein: No evidence of thrombus. Normal compressibility and flow on color Doppler imaging. Femoral Vein: No evidence of thrombus. Normal compressibility, respiratory phasicity and response to augmentation. Popliteal Vein: No evidence of thrombus. Normal compressibility, respiratory phasicity and response to augmentation. Calf Veins: No evidence of thrombus. Normal compressibility and flow on color Doppler imaging. Superficial Great Saphenous Vein: No  evidence of thrombus. Normal compressibility and flow on color Doppler imaging. IMPRESSION: No evidence of DVT within either lower extremity. Electronically Signed   By: Genevive BiStewart  Edmunds M.D.   On: 06/30/2017 15:22   Dg Chest Port 1 View  Result Date: 06/30/2017 CLINICAL DATA:  Hypoxia EXAM: PORTABLE CHEST 1 VIEW COMPARISON:  June 29, 2017 FINDINGS: There is no edema or consolidation. The heart size and pulmonary vascularity are normal. No adenopathy. There is aortic atherosclerosis. No evident bone lesions. IMPRESSION: Aortic atherosclerosis.  No edema or consolidation. Aortic Atherosclerosis (ICD10-I70.0). Electronically Signed   By: Bretta BangWilliam  Woodruff III M.D.   On: 06/30/2017 10:02   Dg Chest Port 1 View  Result Date: 06/29/2017 CLINICAL DATA:  Altered mental status. EXAM: PORTABLE CHEST 1 VIEW COMPARISON:  Abdominal series on 05/30/2017 FINDINGS: The heart size and mediastinal contours are within normal limits. Lung volumes are low bilaterally. There is no evidence of pulmonary edema, consolidation, pneumothorax, nodule or pleural fluid. The visualized skeletal structures are unremarkable. IMPRESSION: No active disease. Electronically Signed   By:  Irish Lack M.D.   On: 06/29/2017 13:25   Dg Chest Port 1v Same Day  Result Date: 06/30/2017 CLINICAL DATA:  PICC line insertion. EXAM: PORTABLE CHEST 1 VIEW COMPARISON:  06/30/2017 . FINDINGS: PICC line noted with its tip projected over the SVC. Heart size normal. Low lung volumes with basilar atelectasis. No pleural effusion pneumothorax. IMPRESSION: PICC line noted with its tip projected over the SVC. Electronically Signed   By: Maisie Fus  Register   On: 06/30/2017 11:55    Cardiac Studies   Echo 05/31/17  - Left ventricle: The cavity size was normal. Wall thickness was at the upper limits of normal. Systolic function was normal. The estimated ejection fraction was in the range of 60% to 65%. Wall motion was normal; there were no regional  wall motion abnormalities. The study is not technically sufficient to allow evaluation of LV diastolic function. - Aortic valve: Mildly calcified annulus. Trileaflet. - Mitral valve: Calcified annulus. There was mild regurgitation. - Left atrium: The atrium was mildly to moderately dilated. - Right atrium: Central venous pressure (est): 8 mm Hg. - Atrial septum: No defect or patent foramen ovale was identified. - Tricuspid valve: There was trivial regurgitation. - Pulmonary arteries: PA peak pressure: 30 mm Hg (S). - Pericardium, extracardiac: There was no pericardial effusion.  Impressions:  - Upper normal LV wall thickness with LVEF 60-65%. Indeterminate diastolic function. Mild to moderate left atrial enlargement. Mildly calcified mitral annulus with mild mitral regurgitation. Mildly sclerotic aortic valve. Trivial tricuspid regurgitation with PASP estimated 30 mmHg.  Patient Profile     76 y.o. female with paroxysmal atrial fibrillation admitted with altered mental status due to urosepsis and rapid atrial fibrillation.  Assessment & Plan    1. Rapid atrial fibrillation: Due to urosepsis. Diltiazem infusion at 15 mg/hr with poorly controlled HR. Currently receiving IV fluids. She is normotensive to hypertensive. Once she is adequately fluid repleted and antimicrobials are able to control infection, HR will able to be better controlled. On IV heparin for anticoagulation. Free T4 also elevated which would contribute to this. I will give a bolus of IV amiodarone 150 mg.  2. Urosepsis: Leukocytosis improving. Currently on Zosyn. Already got doses of vancomycin and ceftriaxone at time of initial presentation. On IV fluids.  3. Altered mental status/acute metabolic encephalopathy: Due to urosepsis. Head CT with chronic microvascular ischemic changes. Continue IV fluids and Zosyn.  4. Troponin elevation: Due to demand ischemia from urosepsis and rapid atrial  fibrillation.  Signed, Prentice Docker, MD  07/01/2017, 9:35 AM

## 2017-07-01 NOTE — Progress Notes (Signed)
ANTICOAGULATION CONSULT NOTE - follow up  Pharmacy Consult for HEPARIN Indication: atrial fibrillation  No Known Allergies  Patient Measurements: Height: 5\' 6"  (167.6 cm) Weight: 203 lb 4.2 oz (92.2 kg) IBW/kg (Calculated) : 59.3  Vital Signs: Temp: 99.6 F (37.6 C) (07/06 0400) Temp Source: Axillary (07/06 0400) BP: 157/68 (07/06 0000) Pulse Rate: 135 (07/06 0000)  Labs:  Recent Labs  06/29/17 1304 06/29/17 2241  06/30/17 0359 06/30/17 0948 06/30/17 1300 06/30/17 2051 07/01/17 0516  HGB 12.0  --   --  11.2*  --   --   --  10.5*  HCT 34.3*  --   --  32.3*  --   --   --  30.4*  PLT 435*  --   --  357  --   --   --  351  APTT  --  21*  --  106*  --   --  >200*  --   LABPROT  --  13.9  --   --   --   --   --   --   INR  --  1.07  --   --   --   --   --   --   HEPARINUNFRC  --   --   < > 0.74*  --  1.12* 0.80* 0.59  CREATININE 1.54*  --   --  1.05*  --   --   --  1.18*  TROPONINI 0.03* 0.06*  --  0.05* 0.04*  --   --   --   < > = values in this interval not displayed. Estimated Creatinine Clearance: 47.9 mL/min (A) (by C-G formula based on SCr of 1.18 mg/dL (H)).  Medical History: Past Medical History:  Diagnosis Date  . Atrial fibrillation (HCC)   . Diabetes mellitus without complication (HCC)   . High cholesterol   . Hypertension   . Vertigo     Medications:  Prescriptions Prior to Admission  Medication Sig Dispense Refill Last Dose  . apixaban (ELIQUIS) 5 MG TABS tablet Take 1 tablet (5 mg total) by mouth 2 (two) times daily. 60 tablet 1   . cholecalciferol (VITAMIN D) 1000 units tablet Take 1,000 Units by mouth daily.     . meclizine (ANTIVERT) 25 MG tablet Take 25 mg by mouth 4 (four) times daily as needed for dizziness.     . metoprolol tartrate 37.5 MG TABS Take 37.5 mg by mouth 2 (two) times daily. 60 tablet 0   . Multiple Vitamin (MULTIVITAMIN WITH MINERALS) TABS Take 1 tablet by mouth daily.     . ondansetron (ZOFRAN) 4 MG tablet Take 4 mg by mouth 3  (three) times daily.     . vitamin B-12 (CYANOCOBALAMIN) 1000 MCG tablet Take 1,000 mcg by mouth daily.      Marland Kitchen. acetaminophen (TYLENOL) 500 MG tablet Take 500 mg by mouth daily as needed for pain.   unknown  . atorvastatin (LIPITOR) 40 MG tablet Take 40 mg by mouth every morning.    05/30/2017 at Unknown time  . lisinopril (PRINIVIL,ZESTRIL) 20 MG tablet Take 1 tablet (20 mg total) by mouth daily. 30 tablet 0   . metFORMIN (GLUCOPHAGE-XR) 500 MG 24 hr tablet Take 1 tablet by mouth 2 (two) times daily.   05/30/2017 at Unknown time  . omeprazole (PRILOSEC) 20 MG capsule Take 20 mg by mouth daily.   05/29/2017 at Unknown time   Assessment:  Heparin level is therapeutic today after rate decrease last  night.  APTT reported as >200 sec.  ?lab error?    Goal of Therapy:  Heparin level 0.3-0.7 units/ml Monitor platelets by anticoagulation protocol: Yes   Plan:   Continue heparin at 800 units/hr  Heparin level and CBC daily  Gifford Ballon Poteet 07/01/2017,7:58 AM

## 2017-07-01 NOTE — Care Management Important Message (Signed)
Important Message  Patient Details  Name: Meridee ScoreFannie L Hanley MRN: [id number] Date of Birth: 24-Jun-1941   Medicare Important Message Given:  Yes    Xavia Kniskern, Chrystine OilerSharley Diane, RN 07/01/2017, 2:26 PM

## 2017-07-01 NOTE — Progress Notes (Addendum)
PROGRESS NOTE  Deborah Benjamin ZOX:[id number] DOB: August 18, 1941 DOA: 06/29/2017 PCP: Pearson Grippe, MD  Brief History:  76 year old female with a history of hypertension, hyperlipidemia, diabetes mellitus, and recently diagnosed atrial fibrillation presented with altered mental status. According to the patient's sister, the patient was "not right" at the time of her last hospital discharge. The patient was admitted to the hospital from 05/30/2017 through 05/31/2017 at which time, the patient was found to have new onset atrial fibrillation. The patient was discharged home with metoprolol and apixiban. Since discharge from the hospital, the patient has had a waxing and waning episodes of confusion which has worsened in the 24 hours prior to coming back to the emergency department. According to the patient's sister, the patient was "awful". In the emergency department, the patient was noted to have a low-grade temperature 99.3 with intermittent episodes of hypotension. She was found have atrial fibrillation with RVR with heart rate 143. WBC was 14.8 with lactic acid 1.59. In addition, her serum creatinine was elevated beyond her normal baseline.   Assessment/Plan: Acute metabolic encephalopathy -Multifactorial including atrial fibrillation, AKI, UTI and possible embolic stroke -8/1--XBJ much improvement -CT brain negative -UA 6-30 WBC -Check serum B12--4525 -ammonia--11 -TSH 0.416 -concerned about new embolic stroke with afib and noncompliance with apixaban -MR brain  Atrial Fibrillation with RVR -doubt medication compliance at time of d/c including metoprolol and apixiban -Continue diltiazem drip -appreciate cardiology follow up-->amiodarone x 1 given 7/6 -CHADSVASc = 5 (HTN, Age, DM, female) -TSH 0.416 -05/31/2017 Echo EF 60-65%, mild MR, trivial TR, PASP 30 -continue IV heparin until pt able to take po reliably  Acute on chronic renal failure--CKD stage 3 -Secondary to volume  depletion -Baseline creatinine 0.8-1.1  -Presenting creatinine 1.54 -Improved with IV fluids  -Saline lock IV fluids as the patient appears euvolemic at this time  -holding lisinopril  Sepsis -secondary to urine source and possible aspiration pneumonitis -continue zosyn  -06/30/17 repeat CXR--personally reviewed--no consolidation, ?increase RLL opacity  Pyuria/UTI -Continue Zosyn   Essential hypertension  -The patient had intermittent episodes of hypotension which may been rate related due to her atrial fibrillation  -Blood pressure is improved and acceptable on diltiazem drip  -Doubt medication compliance after her last discharge  Lower extremity pain and edema  -Venous duplex r/o DVT  Diabetes mellitus type 2  -Holding metformin  -05/30/2017 hemoglobin A1c 6.6  -d/c CBGs and insulin--CBGs past 24 hours well controlled  Elevated troponin -due to demand ischemia -personally reviewed EKG--no ST-T wave changes    Disposition Plan:   Remain in SDU Family Communication:  updated sister at bedside 7/6--Total time spent 35 minutes.  Greater than 50% spent face to face counseling and coordinating care.   Consultants:  none  Code Status:   DNR  DVT Prophylaxis:  IV Heparin   Procedures: As Listed in Progress Note Above  Antibiotics: vanco 7/4>>>7/5 Zosyn 7/4>>>    Subjective: Patient is somnolent. She occasionally answers questions with multiple stimuli. She denies any pain or shortness of breath. Otherwise review of systems unobtainable. No reports of vomiting, respiratory distress, diarrhea, uncontrolled pain.  Objective: Vitals:   07/01/17 0600 07/01/17 0700 07/01/17 0800 07/01/17 0900  BP: (!) 141/93 (!) 166/106 (!) 151/114 (!) 163/68  Pulse: (!) 132 (!) 145 (!) 145 (!) 150  Resp: (!) 32 (!) 28 (!) 25 (!) 30  Temp:   99.5 F (37.5 C)   TempSrc:   Axillary  SpO2: 100% 100% 100% 100%  Weight:      Height:        Intake/Output Summary  (Last 24 hours) at 07/01/17 1053 Last data filed at 07/01/17 0900  Gross per 24 hour  Intake          2903.24 ml  Output             1400 ml  Net          1503.24 ml   Weight change: -7.591 kg (-16 lb 11.8 oz) Exam:   General:  Pt is alert, follows commands appropriately, not in acute distress  HEENT: No icterus, No thrush, No neck mass, [place]/AT  Cardiovascular: IRRR, S1/S2, no rubs, no gallops  Respiratory: Bibasilar crackles. No wheeze. Good air movement.  Abdomen: Soft/+BS, non tender, non distended, no guarding  Extremities: 1+ LE edema, No lymphangitis, No petechiae, No rashes, no synovitis   Data Reviewed: I have personally reviewed following labs and imaging studies Basic Metabolic Panel:  Recent Labs Lab 06/29/17 1304 06/30/17 0359 07/01/17 0516  NA 144 145 147*  K 3.4* 3.3* 2.8*  CL 105 114* 116*  CO2 20* 22 20*  GLUCOSE 183* 150* 172*  BUN 51* 37* 27*  CREATININE 1.54* 1.05* 1.18*  CALCIUM 10.2 8.5* 8.5*  MG  --   --  1.1*   Liver Function Tests:  Recent Labs Lab 06/29/17 1304  AST 21  ALT 20  ALKPHOS 65  BILITOT 1.5*  PROT 7.2  ALBUMIN 4.1   No results for input(s): LIPASE, AMYLASE in the last 168 hours.  Recent Labs Lab 06/30/17 0948  AMMONIA 11   Coagulation Profile:  Recent Labs Lab 06/29/17 2241  INR 1.07   CBC:  Recent Labs Lab 06/29/17 1304 06/30/17 0359 07/01/17 0516  WBC 14.8* 14.1* 11.1*  NEUTROABS 13.0*  --   --   HGB 12.0 11.2* 10.5*  HCT 34.3* 32.3* 30.4*  MCV 89.1 89.5 89.9  PLT 435* 357 351   Cardiac Enzymes:  Recent Labs Lab 06/29/17 1304 06/29/17 2241 06/30/17 0359 06/30/17 0948  TROPONINI 0.03* 0.06* 0.05* 0.04*   BNP: Invalid input(s): POCBNP CBG:  Recent Labs Lab 06/30/17 1630 06/30/17 1938 07/01/17 0024 07/01/17 0609 07/01/17 0721  GLUCAP 156* 128* 122* 158* 149*   HbA1C: No results for input(s): HGBA1C in the last 72 hours. Urine analysis:    Component Value Date/Time    COLORURINE AMBER (A) 06/29/2017 1342   APPEARANCEUR HAZY (A) 06/29/2017 1342   LABSPEC 1.019 06/29/2017 1342   PHURINE 5.0 06/29/2017 1342   GLUCOSEU NEGATIVE 06/29/2017 1342   HGBUR MODERATE (A) 06/29/2017 1342   BILIRUBINUR NEGATIVE 06/29/2017 1342   KETONESUR 5 (A) 06/29/2017 1342   PROTEINUR 30 (A) 06/29/2017 1342   UROBILINOGEN 0.2 03/06/2015 1213   NITRITE NEGATIVE 06/29/2017 1342   LEUKOCYTESUR MODERATE (A) 06/29/2017 1342   Sepsis Labs: @LABRCNTIP (procalcitonin:4,lacticidven:4) ) Recent Results (from the past 240 hour(s))  Blood Culture (routine x 2)     Status: None (Preliminary result)   Collection Time: 06/29/17  1:35 PM  Result Value Ref Range Status   Specimen Description BLOOD RIGHT WRIST  Final   Special Requests   Final    BOTTLES DRAWN AEROBIC AND ANAEROBIC Blood Culture results may not be optimal due to an inadequate volume of blood received in culture bottles   Culture NO GROWTH 2 DAYS  Final   Report Status PENDING  Incomplete  Urine culture     Status: Abnormal  Collection Time: 06/29/17  1:42 PM  Result Value Ref Range Status   Specimen Description URINE, CLEAN CATCH  Final   Special Requests NONE  Final   Culture MULTIPLE SPECIES PRESENT, SUGGEST RECOLLECTION (A)  Final   Report Status 07/01/2017 FINAL  Final  Culture, blood (Routine X 2) w Reflex to ID Panel     Status: None (Preliminary result)   Collection Time: 06/29/17  3:55 PM  Result Value Ref Range Status   Specimen Description BLOOD LEFT HAND  Final   Special Requests   Final    Blood Culture results may not be optimal due to an inadequate volume of blood received in culture bottles   Culture NO GROWTH 2 DAYS  Final   Report Status PENDING  Incomplete  MRSA PCR Screening     Status: None   Collection Time: 06/29/17 10:05 PM  Result Value Ref Range Status   MRSA by PCR NEGATIVE NEGATIVE Final    Comment:        The GeneXpert MRSA Assay (FDA approved for NASAL specimens only), is one  component of a comprehensive MRSA colonization surveillance program. It is not intended to diagnose MRSA infection nor to guide or monitor treatment for MRSA infections.      Scheduled Meds: . insulin aspart  0-9 Units Subcutaneous Q4H   Continuous Infusions: . diltiazem (CARDIZEM) infusion 15 mg/hr (07/01/17 0900)  . heparin 800 Units/hr (06/30/17 2148)  . magnesium sulfate 1 - 4 g bolus IVPB    . piperacillin-tazobactam (ZOSYN)  IV 3.375 g (07/01/17 0755)  . potassium chloride    . sodium chloride 0.45 % with kcl      Procedures/Studies: Ct Head Wo Contrast  Result Date: 06/29/2017 CLINICAL DATA:  Confusion for 1 week. EXAM: CT HEAD WITHOUT CONTRAST TECHNIQUE: Contiguous axial images were obtained from the base of the skull through the vertex without intravenous contrast. COMPARISON:  Head CT scan 05/30/2017 in 03/06/2015. FINDINGS: Brain: There is some chronic microvascular ischemic change. No evidence of acute intracranial abnormality including hemorrhage, infarct, mass lesion, mass effect, midline shift or abnormal extra-axial fluid collection. No hydrocephalus or pneumocephalus. Vascular: Atherosclerosis noted. Skull: Intact. Sinuses/Orbits: Negative. Other: None. IMPRESSION: No acute abnormality. Atherosclerosis. Mild appearing chronic microvascular ischemic change. Electronically Signed   By: Drusilla Kanner M.D.   On: 06/29/2017 16:25   US Venous Img Lower Bilateral  Result Date: 06/30/2017 CLINICAL DATA:  Bilateral lower extremity pain and edema EXAM: BILATERAL LOWER EXTREMITY VENOUS DOPPLER ULTRASOUND TECHNIQUE: Gray-scale sonography with graded compression, as well as color Doppler and duplex ultrasound were performed to evaluate the lower extremity deep venous systems from the level of the common femoral vein and including the common femoral, femoral, profunda femoral, popliteal and calf veins including the posterior tibial, peroneal and gastrocnemius veins when visible. The  superficial great saphenous vein was also interrogated. Spectral Doppler was utilized to evaluate flow at rest and with distal augmentation maneuvers in the common femoral, femoral and popliteal veins. COMPARISON:  None. FINDINGS: RIGHT LOWER EXTREMITY Common Femoral Vein: No evidence of thrombus. Normal compressibility, respiratory phasicity and response to augmentation. Saphenofemoral Junction: No evidence of thrombus. Normal compressibility and flow on color Doppler imaging. Profunda Femoral Vein: No evidence of thrombus. Normal compressibility and flow on color Doppler imaging. Femoral Vein: No evidence of thrombus. Normal compressibility, respiratory phasicity and response to augmentation. Popliteal Vein: No evidence of thrombus. Normal compressibility, respiratory phasicity and response to augmentation. Calf Veins: No evidence of thrombus. Normal  compressibility and flow on color Doppler imaging. Superficial Great Saphenous Vein: No evidence of thrombus. Normal compressibility and flow on color Doppler imaging. Marland Kitchen LEFT LOWER EXTREMITY Common Femoral Vein: No evidence of thrombus. Normal compressibility, respiratory phasicity and response to augmentation. Saphenofemoral Junction: No evidence of thrombus. Normal compressibility and flow on color Doppler imaging. Profunda Femoral Vein: No evidence of thrombus. Normal compressibility and flow on color Doppler imaging. Femoral Vein: No evidence of thrombus. Normal compressibility, respiratory phasicity and response to augmentation. Popliteal Vein: No evidence of thrombus. Normal compressibility, respiratory phasicity and response to augmentation. Calf Veins: No evidence of thrombus. Normal compressibility and flow on color Doppler imaging. Superficial Great Saphenous Vein: No evidence of thrombus. Normal compressibility and flow on color Doppler imaging. IMPRESSION: No evidence of DVT within either lower extremity. Electronically Signed   By: Genevive Bi M.D.    On: 06/30/2017 15:22   Dg Chest Port 1 View  Result Date: 06/30/2017 CLINICAL DATA:  Hypoxia EXAM: PORTABLE CHEST 1 VIEW COMPARISON:  June 29, 2017 FINDINGS: There is no edema or consolidation. The heart size and pulmonary vascularity are normal. No adenopathy. There is aortic atherosclerosis. No evident bone lesions. IMPRESSION: Aortic atherosclerosis.  No edema or consolidation. Aortic Atherosclerosis (ICD10-I70.0). Electronically Signed   By: Bretta Bang III M.D.   On: 06/30/2017 10:02   Dg Chest Port 1 View  Result Date: 06/29/2017 CLINICAL DATA:  Altered mental status. EXAM: PORTABLE CHEST 1 VIEW COMPARISON:  Abdominal series on 05/30/2017 FINDINGS: The heart size and mediastinal contours are within normal limits. Lung volumes are low bilaterally. There is no evidence of pulmonary edema, consolidation, pneumothorax, nodule or pleural fluid. The visualized skeletal structures are unremarkable. IMPRESSION: No active disease. Electronically Signed   By: Irish Lack M.D.   On: 06/29/2017 13:25   Dg Chest Port 1v Same Day  Result Date: 06/30/2017 CLINICAL DATA:  PICC line insertion. EXAM: PORTABLE CHEST 1 VIEW COMPARISON:  06/30/2017 . FINDINGS: PICC line noted with its tip projected over the SVC. Heart size normal. Low lung volumes with basilar atelectasis. No pleural effusion pneumothorax. IMPRESSION: PICC line noted with its tip projected over the SVC. Electronically Signed   By: Maisie Fus  Register   On: 06/30/2017 11:55    Nikos Anglemyer, DO  [facility] Pager [phone]  If 7PM-7AM, please contact night-coverage [email or web address] Password [email or web address] 07/01/2017, 10:53 AM   LOS: 2 days

## 2017-07-01 NOTE — Consult Note (Signed)
Consultation Note Date: 07/01/2017   Patient Name: Deborah Benjamin  DOB: 12/02/1941  MRN: [id number]  Age / Sex: 76 y.o., female  PCP: Pearson GrippeKim, James, MD Referring Physician: Catarina Hartshornat, David, MD  Reason for Consultation: Establishing goals of care and Psychosocial/spiritual support  HPI/Patient Profile: 76 y.o. female  with past medical history of Diabetes without complications, high blood pressure and high cholesterol, atrial fibrillation, history of gallbladder surgery and abdominal hysterectomy admitted on 06/29/2017 with acute encephalopathy possibly related to sepsis.   Clinical Assessment and Goals of Care: Deborah Benjamin quietly in bed. When I say hey Yoshie she does not open her eyes, but responds "hey". She does not follow command or open eyes when I request. She does not guard against pain. I ask if she is thirsty and would like something to drink and she clearly states "no". She is unable to open her eyes when our request her to do so, and tries to keep her eyes closed when I raise her eyelids. There is no family at bedside at this time.  Call to sister, Deborah Benjamin. Left voicemail requesting family meeting on Monday between 9 a and 2P. I encourage Deborah Benjamin to let nursing staff know what time will work for her for family meeting.   Conference with hospitalist, Dr. Arbutus Leasat, conference with nursing staff.  Healthcare power of attorney NEXT OF KIN - only contact listed in chart is sister, Deborah Benjamin. Unable to reach, therefore unable to verify healthcare power of attorney.   SUMMARY OF RECOMMENDATIONS   at this point, 24 to 48 hours for recovery, family meeting with sister and PMT suggested Monday. Left a message with sister requesting meeting.  Code Status/Advance Care Planning:  DNR  Symptom Management:   per hospitalist, no additional needs at this time.  Palliative Prophylaxis:   Aspiration and Turn  Reposition  Additional Recommendations (Limitations, Scope, Preferences):  Treat the treatable but no CPR or intubation  Psycho-social/Spiritual:   Desire for further Chaplaincy support:no  Additional Recommendations: Caregiving  Support/Resources  Prognosis:   Unable to determine, based on outcomes.  Discharge Planning: To Be Determined      Primary Diagnoses: Present on Admission: . Acute encephalopathy . Diabetes mellitus type 2 in obese (HCC) . Essential hypertension   I have reviewed the medical record, interviewed the patient and family, and examined the patient. The following aspects are pertinent.  Past Medical History:  Diagnosis Date  . Atrial fibrillation (HCC)   . Diabetes mellitus without complication (HCC)   . High cholesterol   . Hypertension   . Vertigo    Social History   Social History  . Marital status: Divorced    Spouse name: N/A  . Number of children: N/A  . Years of education: N/A   Occupational History  . retired    Social History Main Topics  . Smoking status: Never Smoker  . Smokeless tobacco: Never Used  . Alcohol use No  . Drug use: No  . Sexual activity: Not Asked  Other Topics Concern  . None   Social History Narrative  . None   Family History  Problem Relation Age of Onset  . Diabetes Sister   . CVA Mother 19  . CAD Father        26   Scheduled Meds: . insulin aspart  0-9 Units Subcutaneous Q4H   Continuous Infusions: . diltiazem (CARDIZEM) infusion 15 mg/hr (07/01/17 1008)  . heparin 800 Units/hr (07/01/17 1535)  . magnesium sulfate 1 - 4 g bolus IVPB Stopped (07/01/17 1535)  . piperacillin-tazobactam (ZOSYN)  IV Stopped (07/01/17 1155)  . potassium chloride Stopped (07/01/17 1414)  . sodium chloride 0.45 % with kcl 75 mL/hr at 07/01/17 1200   PRN Meds:.acetaminophen, acetaminophen, LORazepam, ondansetron (ZOFRAN) IV Medications Prior to Admission:  Prior to Admission medications   Medication Sig  Start Date End Date Taking? Authorizing Provider  apixaban (ELIQUIS) 5 MG TABS tablet Take 1 tablet (5 mg total) by mouth 2 (two) times daily. 05/31/17  Yes Erick Blinks, MD  cholecalciferol (VITAMIN D) 1000 units tablet Take 1,000 Units by mouth daily.   Yes [provider]  meclizine (ANTIVERT) 25 MG tablet Take 25 mg by mouth 4 (four) times daily as needed for dizziness.   Yes [provider]  metoprolol tartrate 37.5 MG TABS Take 37.5 mg by mouth 2 (two) times daily. 05/31/17  Yes Erick Blinks, MD  Multiple Vitamin (MULTIVITAMIN WITH MINERALS) TABS Take 1 tablet by mouth daily.   Yes [provider]  ondansetron (ZOFRAN) 4 MG tablet Take 4 mg by mouth 3 (three) times daily.   Yes [provider]  vitamin B-12 (CYANOCOBALAMIN) 1000 MCG tablet Take 1,000 mcg by mouth daily.    Yes [provider]  acetaminophen (TYLENOL) 500 MG tablet Take 500 mg by mouth daily as needed for pain.    [provider]  atorvastatin (LIPITOR) 40 MG tablet Take 40 mg by mouth every morning.     [provider]  lisinopril (PRINIVIL,ZESTRIL) 20 MG tablet Take 1 tablet (20 mg total) by mouth daily. 05/31/17 05/31/18  Erick Blinks, MD  metFORMIN (GLUCOPHAGE-XR) 500 MG 24 hr tablet Take 1 tablet by mouth 2 (two) times daily. 03/16/17   [provider]  omeprazole (PRILOSEC) 20 MG capsule Take 20 mg by mouth daily.    [provider]   No Known Allergies Review of Systems  Unable to perform ROS: Mental status change    Physical Exam  Constitutional: No distress.  Will respond at times with one word answers, does not follow commands  HENT:  Head: Normocephalic and atraumatic.  Cardiovascular:  A fib rate 100's to 110's  Pulmonary/Chest: Effort normal. No respiratory distress.  Abdominal: She exhibits no distension.  Musculoskeletal: She exhibits no edema.  Neurological:  Does not open eyes to touch or command  Skin: Skin is warm  and dry.  Nursing note and vitals reviewed.   Vital Signs: BP (!) 156/105   Pulse (!) 150   Temp 99.6 F (37.6 C) (Axillary)   Resp (!) 28   Ht 5\' 6"  (1.676 m)   Wt 92.2 kg (203 lb 4.2 oz)   SpO2 96%   BMI 32.81 kg/m  Pain Assessment: No/denies pain POSS *See Group Information*: 2-Acceptable,Slightly drowsy, easily aroused     SpO2: SpO2: 96 % O2 Device:SpO2: 96 % O2 Flow Rate: .   IO: Intake/output summary:  Intake/Output Summary (Last 24 hours) at 07/01/17 1512 Last data filed at 07/01/17 1500  Gross per 24 hour  Intake          2724.92 ml  Output              800 ml  Net          1924.92 ml    LBM: Last BM Date: 06/29/17 Baseline Weight: Weight: 99.8 kg (220 lb) Most recent weight: Weight: 92.2 kg (203 lb 4.2 oz)     Palliative Assessment/Data:   Flowsheet Rows     Most Recent Value  Intake Tab  Referral Department  Hospitalist  Unit at Time of Referral  ICU  Palliative Care Primary Diagnosis  Cardiac  Date Notified  07/01/17  Palliative Care Type  New Palliative care  Reason for referral  Clarify Goals of Care, Psychosocial or Spiritual support  Date of Admission  06/29/17  Date first seen by Palliative Care  07/01/17  # of days Palliative referral response time  0 Day(s)  # of days IP prior to Palliative referral  2  Clinical Assessment  Pain Max last 24 hours  Not able to report  Pain Min Last 24 hours  Not able to report  Dyspnea Max Last 24 Hours  Not able to report  Dyspnea Min Last 24 hours  Not able to report  Psychosocial & Spiritual Assessment  Palliative Care Outcomes  Patient/Family meeting held?  No [Patient unable to participate, left voicemail for sister]  Patient/Family wishes: Interventions discontinued/not started   Mechanical Ventilation      Time In: 1405 Time Out: 1435 Time Total: 30 minutes  Greater than 50%  of this time was spent counseling and coordinating care related to the above assessment and plan.  Signed by: Katheran Awe, NP   Please contact Palliative Medicine Team phone at [phone] for questions and concerns.  For individual provider: See Loretha Stapler

## 2017-07-01 NOTE — Progress Notes (Signed)
Initial Nutrition Assessment  Obesity unspecified  INTERVENTION:  Follow for diet advancement    NUTRITION DIAGNOSIS:   Inadequate oral intake related to inability to eat, lethargy/confusion as evidenced by NPO status.   GOAL:   Patient will meet greater than or equal to 90% of their needs (pending Palliative consult to determine goals of care)   MONITOR:    (Healthcare decisions, diet adv, po intake and labs)  REASON FOR ASSESSMENT:   Low Braden    ASSESSMENT:  The patient is a 76 yo female who lives alone. She presents with acute metabolic encephalopathy. Her sister is at bedside and provided hx on her behalf. Patient is sedated due to agitation in ED and has restraints. She has a hx of DM-2 A.Fib, and HTN.  The patient is on a regular diet at home and has not been eating well for several months. Her mobility has decreased to the point she spends most of her day in a recliner. She is able to get up and go to the kitchen but is not able to stand long enough to cook or prepare a variety of foods. She has been eating cereal mostly in the morning and evening (cherrios). She also eats fruits and likes french fries, chicken and [place]key. She doesn't eat beef or pork.  The sister says she has been bringing in foods for her such as chicken and dumplings but the patient just has not had an appetite. Her primary beverages are tea and diet pepsi.  Her sister reports her wt around 180# the last Dr visit (Dr. Selena BattenKim). There is some discrepancy with her wt hx between hospital records and pt sister's recall. Ex[ect that Ms Melissa NoonDameron has lost based on the accounting of her food intake lately.  Unable to complete Nutrition-Focused physical exam at this time.     Recent Labs Lab 06/29/17 1304 06/30/17 0359 07/01/17 0516  NA 144 145 147*  K 3.4* 3.3* 2.8*  CL 105 114* 116*  CO2 20* 22 20*  BUN 51* 37* 27*  CREATININE 1.54* 1.05* 1.18*  CALCIUM 10.2 8.5* 8.5*  MG  --   --  1.1*  GLUCOSE 183*  150* 172*  Labs: Sodium 147, Potassium 2.8 BUN 27, Cr 1.18, Mag 1.1 and Glu 172   Diet Order:  Diet NPO time specified  Skin:   (MASD)  Last BM:  7/4   Height:   Ht Readings from Last 1 Encounters:  06/29/17 5\' 6"  (1.676 m)    Weight:   Wt Readings from Last 1 Encounters:  07/01/17 203 lb 4.2 oz (92.2 kg)    Ideal Body Weight:  59 kg  BMI:  Body mass index is 32.81 kg/m.  Estimated Nutritional Needs:   Kcal:  [id number]   Protein:  110-118 gr  Fluid:  1.3-1.4 liters daily  EDUCATION NEEDS: not appropriate at this time   Education needs no appropriate at this time Royann ShiversLynn Anas Reister MS,RD,CSG,LDN Office: #[phone] Pager: [phone]

## 2017-07-01 NOTE — Care Management Note (Signed)
Case Management Note  Patient Details  Name: Deborah Benjamin MRN: [id number] Date of Birth: 1941/11/26  Subjective/Objective: Adm with acute encephalopathy/Afib/urosepsis. Chart reviewed. No CM consult.  Per last visit, this patient lives alone, ind PTA, sister lives next door. She DC'd home with self care and Xarelto prescription.                  Action/Plan: CM will follow for needs. Disposition unclear at this time.   Expected Discharge Date:      07/04/2017            Expected Discharge Plan:     In-House Referral:     Discharge planning Services  CM Consult  Post Acute Care Choice:    Choice offered to:     DME Arranged:    DME Agency:     HH Arranged:    HH Agency:     Status of Service:  In process, will continue to follow  If discussed at Long Length of Stay Meetings, dates discussed:    Additional Comments:  Seyon Strader, Chrystine OilerSharley Diane, RN 07/01/2017, 2:18 PM

## 2017-07-02 LAB — BLOOD GAS, ARTERIAL
Acid-base deficit: 3.3 mmol/L — ABNORMAL HIGH (ref 0.0–2.0)
Bicarbonate: 22.2 mmol/L (ref 20.0–28.0)
Drawn by: 221791
O2 CONTENT: 3 L/min
O2 SAT: 98.7 %
PCO2 ART: 29.3 mmHg — AB (ref 32.0–48.0)
PH ART: 7.449 (ref 7.350–7.450)
PO2 ART: 123 mmHg — AB (ref 83.0–108.0)
Patient temperature: 37

## 2017-07-02 LAB — BASIC METABOLIC PANEL
Anion gap: 10 (ref 5–15)
BUN: 21 mg/dL — AB (ref 6–20)
CALCIUM: 8.6 mg/dL — AB (ref 8.9–10.3)
CO2: 21 mmol/L — ABNORMAL LOW (ref 22–32)
CREATININE: 0.97 mg/dL (ref 0.44–1.00)
Chloride: 116 mmol/L — ABNORMAL HIGH (ref 101–111)
GFR calc Af Amer: >60 mL/min (ref >60)
GFR, EST NON AFRICAN AMERICAN: 56 mL/min — AB (ref >60)
GLUCOSE: 160 mg/dL — AB (ref 65–99)
Potassium: 3.4 mmol/L — ABNORMAL LOW (ref 3.5–5.1)
SODIUM: 147 mmol/L — AB (ref 135–145)

## 2017-07-02 LAB — GLUCOSE, CAPILLARY
GLUCOSE-CAPILLARY: 141 mg/dL — AB (ref 65–99)
GLUCOSE-CAPILLARY: 144 mg/dL — AB (ref 65–99)
Glucose-Capillary: 132 mg/dL — ABNORMAL HIGH (ref 65–99)
Glucose-Capillary: 149 mg/dL — ABNORMAL HIGH (ref 65–99)
Glucose-Capillary: 157 mg/dL — ABNORMAL HIGH (ref 65–99)
Glucose-Capillary: 163 mg/dL — ABNORMAL HIGH (ref 65–99)

## 2017-07-02 LAB — CBC
HCT: 31.3 % — ABNORMAL LOW (ref 36.0–46.0)
Hemoglobin: 10.7 g/dL — ABNORMAL LOW (ref 12.0–15.0)
MCH: 30.7 pg (ref 26.0–34.0)
MCHC: 34.2 g/dL (ref 30.0–36.0)
MCV: 89.9 fL (ref 78.0–100.0)
PLATELETS: 351 10*3/uL (ref 150–400)
RBC: 3.48 MIL/uL — ABNORMAL LOW (ref 3.87–5.11)
RDW: 13.9 % (ref 11.5–15.5)
WBC: 12.6 10*3/uL — ABNORMAL HIGH (ref 4.0–10.5)

## 2017-07-02 LAB — MAGNESIUM: MAGNESIUM: 1.8 mg/dL (ref 1.7–2.4)

## 2017-07-02 LAB — HEPARIN LEVEL (UNFRACTIONATED): Heparin Unfractionated: 0.48 IU/mL (ref 0.30–0.70)

## 2017-07-02 MED ORDER — ORAL CARE MOUTH RINSE
15.0000 mL | Freq: Two times a day (BID) | OROMUCOSAL | Status: DC
  Administered 2017-07-02 – 2017-07-11 (×16): 15 mL via OROMUCOSAL

## 2017-07-02 MED ORDER — CHLORHEXIDINE GLUCONATE 0.12 % MT SOLN
15.0000 mL | Freq: Two times a day (BID) | OROMUCOSAL | Status: DC
  Administered 2017-07-02 – 2017-07-09 (×15): 15 mL via OROMUCOSAL
  Filled 2017-07-02 (×12): qty 15

## 2017-07-02 MED ORDER — DEXTROSE 5 % IV SOLN
INTRAVENOUS | Status: DC
  Filled 2017-07-02 (×4): qty 1000

## 2017-07-02 MED ORDER — SODIUM CHLORIDE 0.9% FLUSH
10.0000 mL | Freq: Two times a day (BID) | INTRAVENOUS | Status: DC
  Administered 2017-07-02 – 2017-07-04 (×4): 10 mL
  Administered 2017-07-05: 20 mL
  Administered 2017-07-05 – 2017-07-06 (×2): 10 mL
  Administered 2017-07-06: 20 mL
  Administered 2017-07-06 – 2017-07-09 (×6): 10 mL

## 2017-07-02 MED ORDER — SODIUM CHLORIDE 0.9% FLUSH
10.0000 mL | INTRAVENOUS | Status: DC | PRN
  Administered 2017-07-04 – 2017-07-10 (×5): 10 mL
  Filled 2017-07-02 (×5): qty 40

## 2017-07-02 MED ORDER — POTASSIUM CL IN DEXTROSE 5% 20 MEQ/L IV SOLN
20.0000 meq | INTRAVENOUS | Status: DC
  Administered 2017-07-02 – 2017-07-04 (×4): 20 meq via INTRAVENOUS
  Filled 2017-07-02 (×7): qty 1000

## 2017-07-02 MED ORDER — CHLORHEXIDINE GLUCONATE CLOTH 2 % EX PADS
6.0000 | MEDICATED_PAD | Freq: Every day | CUTANEOUS | Status: DC
  Administered 2017-07-02 – 2017-07-05 (×4): 6 via TOPICAL

## 2017-07-02 MED ORDER — METOPROLOL TARTRATE 5 MG/5ML IV SOLN
2.5000 mg | Freq: Four times a day (QID) | INTRAVENOUS | Status: DC
  Administered 2017-07-02 – 2017-07-03 (×2): 2.5 mg via INTRAVENOUS
  Administered 2017-07-03: 5 mg via INTRAVENOUS
  Administered 2017-07-03 – 2017-07-05 (×10): 2.5 mg via INTRAVENOUS
  Filled 2017-07-02 (×13): qty 5

## 2017-07-02 MED ORDER — POTASSIUM CHLORIDE 10 MEQ/100ML IV SOLN
10.0000 meq | INTRAVENOUS | Status: AC
  Administered 2017-07-02 (×2): 10 meq via INTRAVENOUS
  Filled 2017-07-02 (×2): qty 100

## 2017-07-02 NOTE — Progress Notes (Signed)
ANTICOAGULATION CONSULT NOTE - follow up  Pharmacy Consult for HEPARIN Indication: atrial fibrillation  No Known Allergies  Patient Measurements: Height: 5\' 6"  (167.6 cm) Weight: 206 lb 2.1 oz (93.5 kg) IBW/kg (Calculated) : 59.3  Vital Signs: Temp: 99 F (37.2 C) (07/07 0800) Temp Source: Axillary (07/07 0800) BP: 151/66 (07/07 0630) Pulse Rate: 122 (07/07 0723)  Labs:  Recent Labs  06/29/17 2241 06/30/17 0359 06/30/17 0948  06/30/17 2051 07/01/17 0516 07/02/17 0450  HGB  --  11.2*  --   --   --  10.5* 10.7*  HCT  --  32.3*  --   --   --  30.4* 31.3*  PLT  --  357  --   --   --  351 351  APTT 21* 106*  --   --  >200*  --   --   LABPROT 13.9  --   --   --   --   --   --   INR 1.07  --   --   --   --   --   --   HEPARINUNFRC  --  0.74*  --   < > 0.80* 0.59 0.48  CREATININE  --  1.05*  --   --   --  1.18* 0.97  TROPONINI 0.06* 0.05* 0.04*  --   --   --   --   < > = values in this interval not displayed. Estimated Creatinine Clearance: 58.6 mL/min (by C-G formula based on SCr of 0.97 mg/dL).  Medical History: Past Medical History:  Diagnosis Date  . Atrial fibrillation (HCC)   . Diabetes mellitus without complication (HCC)   . High cholesterol   . Hypertension   . Vertigo     Medications:  Prescriptions Prior to Admission  Medication Sig Dispense Refill Last Dose  . apixaban (ELIQUIS) 5 MG TABS tablet Take 1 tablet (5 mg total) by mouth 2 (two) times daily. 60 tablet 1   . cholecalciferol (VITAMIN D) 1000 units tablet Take 1,000 Units by mouth daily.     . meclizine (ANTIVERT) 25 MG tablet Take 25 mg by mouth 4 (four) times daily as needed for dizziness.     . metoprolol tartrate 37.5 MG TABS Take 37.5 mg by mouth 2 (two) times daily. 60 tablet 0   . Multiple Vitamin (MULTIVITAMIN WITH MINERALS) TABS Take 1 tablet by mouth daily.     . ondansetron (ZOFRAN) 4 MG tablet Take 4 mg by mouth 3 (three) times daily.     . vitamin B-12 (CYANOCOBALAMIN) 1000 MCG tablet  Take 1,000 mcg by mouth daily.      Marland Kitchen acetaminophen (TYLENOL) 500 MG tablet Take 500 mg by mouth daily as needed for pain.   unknown  . atorvastatin (LIPITOR) 40 MG tablet Take 40 mg by mouth every morning.    05/30/2017 at Unknown time  . lisinopril (PRINIVIL,ZESTRIL) 20 MG tablet Take 1 tablet (20 mg total) by mouth daily. 30 tablet 0   . metFORMIN (GLUCOPHAGE-XR) 500 MG 24 hr tablet Take 1 tablet by mouth 2 (two) times daily.   05/30/2017 at Unknown time  . omeprazole (PRILOSEC) 20 MG capsule Take 20 mg by mouth daily.   05/29/2017 at Unknown time   Assessment:  Heparin level continues therapeutic this morning. No signs of bleeding.   Goal of Therapy:  Heparin level 0.3-0.7 units/ml Monitor platelets by anticoagulation protocol: Yes   Plan:   Continue heparin at 800 units/hr  Heparin  level and CBC daily  Raquel JamesPittman, Sherrye Puga Bennett 07/02/2017,8:38 AM

## 2017-07-02 NOTE — Progress Notes (Signed)
PROGRESS NOTE  Deborah ScoreFannie L Benjamin NUU:[id number] DOB: 11-Oct-1941 DOA: 06/29/2017 PCP: Pearson GrippeKim, James, MD  Brief History: 76 year old female with a history of hypertension, hyperlipidemia, diabetes mellitus, and recently diagnosed atrial fibrillation presented with altered mental status. According to the patient's sister, the patient was "not right"at the time of her last hospital discharge. The patient was admitted to the hospital from 05/30/2017 through 05/31/2017 at which time, the patient was found to have new onset atrial fibrillation. The patient was discharged home with metoprolol and apixiban. Since discharge from the hospital, the patient has had a waxing and waning episodes of confusion which has worsened in the 24 hours prior to coming back to the emergency department. According to the patient's sister, the patient was "awful". In the emergency department, the patient was noted to have a low-grade temperature 99.3 with intermittent episodes of hypotension. She was found have atrial fibrillation with RVR with heart rate 143. WBC was 14.8 with lactic acid 1.59. In addition, her serum creatinine was elevated beyond her normal baseline.   Assessment/Plan: Acute metabolic encephalopathy -Multifactorial including atrial fibrillation, AKI, UTI and hypernatremia -7/7--not much improvement -CT brain negative -UA 6-30 WBC -Check serum B12--4525 -ammonia--11 -TSH 0.416 -ABG--7.44/29/123/22 on 3 L -MR brain--negative for acute findings -EEG  Atrial Fibrillation with RVR -doubt medication compliance at time of d/cincluding metoprolol and apixiban -Continue diltiazem drip -appreciate cardiology follow up-->amiodarone x 1 given 7/6 -CHADSVASc = 5 (HTN, Age, DM, female) -TSH 0.416 -05/31/2017 EchoEF 60-65%, mild MR, trivial TR, PASP 30 -continue IV heparin until pt able to take po reliably -Start IV Lopressor  Acute on chronic renal failure--CKD stage 3 -Secondary to volume  depletion -Baseline creatinine 0.8-1.1 , -Presenting creatinine 1.54 -Improved with IV fluids   -holding lisinopril  Sepsis -secondary to urine source and possible aspiration pneumonitis -continue zosyn  -06/30/17 repeat CXR--personally reviewed--no consolidation, ?increase RLL opacity  Pyuria/UTI -Continue Zosyn  -culture--multiple organisms  Hypernatremia -d/c 1/2NS -start D5W  Essential hypertension  -The patient had intermittent episodes of hypotension which may been rate related due to her atrial fibrillation  -Blood pressure is improved and acceptable on diltiazem drip  -Doubt medication compliance after her last discharge  Lower extremity pain and edema  -Venous duplex--negative  Diabetes mellitus type 2  -Holding metformin  -05/30/2017 hemoglobin A1c 6.6  -d/c CBGs and insulin--CBGs past 24 hours well controlled  Elevated troponin -due to demand ischemia -personally reviewed EKG--no ST-T wave changes    Disposition Plan: Remain in SDU Family Communication: updated sister at bedside 7/7--Total time spent 35 minutes.  Greater than 50% spent face to face counseling and coordinating care.   Consultants: none  Code Status: DNR  DVT Prophylaxis: IV Heparin   Procedures: As Listed in Progress Note Above  Antibiotics: vanco 7/4>>>7/5 Zosyn 7/4>>>     Subjective: Patient is somnolent, but she arouses on physical examination. She intermittently answer questions. She states "I'm doing okay". Otherwise review of systems unobtainable secondary to patient's encephalopathy.  Objective: Vitals:   07/02/17 1133 07/02/17 1200 07/02/17 1500 07/02/17 1609  BP:      Pulse: (!) 116   (!) 132  Resp: (!) 24   17  Temp: 99.1 F (37.3 C) 99.8 F (37.7 C) 100.2 F (37.9 C) 99.1 F (37.3 C)  TempSrc: Axillary Axillary Axillary Axillary  SpO2: 95%   97%  Weight:      Height:        Intake/Output Summary (  Last 24 hours) at 07/02/17  1723 Last data filed at 07/02/17 0630  Gross per 24 hour  Intake          1561.83 ml  Output                0 ml  Net          1561.83 ml   Weight change: 1.3 kg (2 lb 13.9 oz) Exam:   General:  Pt is alert, follows commands appropriately, not in acute distress  HEENT: No icterus, No thrush, No neck mass, [place]/AT  Cardiovascular: IRRR, S1/S2, no rubs, no gallops  Respiratory: bibasilar crackles, no wheeze  Abdomen: Soft/+BS, non tender, non distended, no guarding  Extremities: No edema, No lymphangitis, No petechiae, No rashes, no synovitis   Data Reviewed: I have personally reviewed following labs and imaging studies Basic Metabolic Panel:  Recent Labs Lab 06/29/17 1304 06/30/17 0359 07/01/17 0516 07/02/17 0450  NA 144 145 147* 147*  K 3.4* 3.3* 2.8* 3.4*  CL 105 114* 116* 116*  CO2 20* 22 20* 21*  GLUCOSE 183* 150* 172* 160*  BUN 51* 37* 27* 21*  CREATININE 1.54* 1.05* 1.18* 0.97  CALCIUM 10.2 8.5* 8.5* 8.6*  MG  --   --  1.1* 1.8   Liver Function Tests:  Recent Labs Lab 06/29/17 1304  AST 21  ALT 20  ALKPHOS 65  BILITOT 1.5*  PROT 7.2  ALBUMIN 4.1   No results for input(s): LIPASE, AMYLASE in the last 168 hours.  Recent Labs Lab 06/30/17 0948  AMMONIA 11   Coagulation Profile:  Recent Labs Lab 06/29/17 2241  INR 1.07   CBC:  Recent Labs Lab 06/29/17 1304 06/30/17 0359 07/01/17 0516 07/02/17 0450  WBC 14.8* 14.1* 11.1* 12.6*  NEUTROABS 13.0*  --   --   --   HGB 12.0 11.2* 10.5* 10.7*  HCT 34.3* 32.3* 30.4* 31.3*  MCV 89.1 89.5 89.9 89.9  PLT 435* 357 351 351   Cardiac Enzymes:  Recent Labs Lab 06/29/17 1304 06/29/17 2241 06/30/17 0359 06/30/17 0948  TROPONINI 0.03* 0.06* 0.05* 0.04*   BNP: Invalid input(s): POCBNP CBG:  Recent Labs Lab 07/02/17 0032 07/02/17 0430 07/02/17 0721 07/02/17 1125 07/02/17 1608  GLUCAP 149* 157* 141* 163* 132*   HbA1C: No results for input(s): HGBA1C in the last 72 hours. Urine  analysis:    Component Value Date/Time   COLORURINE AMBER (A) 06/29/2017 1342   APPEARANCEUR HAZY (A) 06/29/2017 1342   LABSPEC 1.019 06/29/2017 1342   PHURINE 5.0 06/29/2017 1342   GLUCOSEU NEGATIVE 06/29/2017 1342   HGBUR MODERATE (A) 06/29/2017 1342   BILIRUBINUR NEGATIVE 06/29/2017 1342   KETONESUR 5 (A) 06/29/2017 1342   PROTEINUR 30 (A) 06/29/2017 1342   UROBILINOGEN 0.2 03/06/2015 1213   NITRITE NEGATIVE 06/29/2017 1342   LEUKOCYTESUR MODERATE (A) 06/29/2017 1342   Sepsis Labs: @LABRCNTIP (procalcitonin:4,lacticidven:4) ) Recent Results (from the past 240 hour(s))  Blood Culture (routine x 2)     Status: None (Preliminary result)   Collection Time: 06/29/17  1:35 PM  Result Value Ref Range Status   Specimen Description BLOOD RIGHT WRIST  Final   Special Requests   Final    BOTTLES DRAWN AEROBIC AND ANAEROBIC Blood Culture results may not be optimal due to an inadequate volume of blood received in culture bottles   Culture NO GROWTH 3 DAYS  Final   Report Status PENDING  Incomplete  Urine culture     Status: Abnormal  Collection Time: 06/29/17  1:42 PM  Result Value Ref Range Status   Specimen Description URINE, CLEAN CATCH  Final   Special Requests NONE  Final   Culture MULTIPLE SPECIES PRESENT, SUGGEST RECOLLECTION (A)  Final   Report Status 07/01/2017 FINAL  Final  Culture, blood (Routine X 2) w Reflex to ID Panel     Status: None (Preliminary result)   Collection Time: 06/29/17  3:55 PM  Result Value Ref Range Status   Specimen Description BLOOD LEFT HAND  Final   Special Requests   Final    Blood Culture results may not be optimal due to an inadequate volume of blood received in culture bottles   Culture NO GROWTH 3 DAYS  Final   Report Status PENDING  Incomplete  MRSA PCR Screening     Status: None   Collection Time: 06/29/17 10:05 PM  Result Value Ref Range Status   MRSA by PCR NEGATIVE NEGATIVE Final    Comment:        The GeneXpert MRSA Assay  (FDA approved for NASAL specimens only), is one component of a comprehensive MRSA colonization surveillance program. It is not intended to diagnose MRSA infection nor to guide or monitor treatment for MRSA infections.      Scheduled Meds: . chlorhexidine  15 mL Mouth Rinse BID  . Chlorhexidine Gluconate Cloth  6 each Topical Daily  . insulin aspart  0-9 Units Subcutaneous Q4H  . mouth rinse  15 mL Mouth Rinse q12n4p  . metoprolol tartrate  2.5 mg Intravenous Q6H  . nystatin   Topical TID  . sodium chloride flush  10-40 mL Intracatheter Q12H   Continuous Infusions: . dextrose 5 % with kcl    . diltiazem (CARDIZEM) infusion 15 mg/hr (07/02/17 1207)  . heparin 800 Units/hr (07/02/17 0040)  . piperacillin-tazobactam (ZOSYN)  IV 3.375 g (07/02/17 1654)    Procedures/Studies: Ct Head Wo Contrast  Result Date: 06/29/2017 CLINICAL DATA:  Confusion for 1 week. EXAM: CT HEAD WITHOUT CONTRAST TECHNIQUE: Contiguous axial images were obtained from the base of the skull through the vertex without intravenous contrast. COMPARISON:  Head CT scan 05/30/2017 in 03/06/2015. FINDINGS: Brain: There is some chronic microvascular ischemic change. No evidence of acute intracranial abnormality including hemorrhage, infarct, mass lesion, mass effect, midline shift or abnormal extra-axial fluid collection. No hydrocephalus or pneumocephalus. Vascular: Atherosclerosis noted. Skull: Intact. Sinuses/Orbits: Negative. Other: None. IMPRESSION: No acute abnormality. Atherosclerosis. Mild appearing chronic microvascular ischemic change. Electronically Signed   By: Drusilla Kanner M.D.   On: 06/29/2017 16:25   Mr Brain Wo Contrast  Result Date: 07/01/2017 CLINICAL DATA:  Acute encephalopathy. Concern for acute stroke in the setting of atrial fibrillation. EXAM: MRI HEAD WITHOUT CONTRAST TECHNIQUE: Multiplanar, multiecho pulse sequences of the brain and surrounding structures were obtained without intravenous  contrast. COMPARISON:  Head CT from 2 days ago.  Brain MRI 12/05/2009 FINDINGS: Brain: No acute infarction, hemorrhage, hydrocephalus, extra-axial collection or mass lesion. Cerebral white matter disease attributed to chronic small vessel ischemia in this patient with multiple vascular risk factors. There has also been progressive subcortical white matter involvement. Chronic cystic intensity spaces lateral to the atrium of the right lateral ventricle, nonspecific. Brain volume remains normal for age. No evidence of chronic blood products. Vascular: Major flow voids are preserved. Skull and upper cervical spine: No evidence of marrow lesion Sinuses/Orbits: Negative Other: Intermittently motion degraded. IMPRESSION: 1. No acute or reversible finding. 2. 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 Electronically Signed   By: Marnee Spring M.D.   On: 07/01/2017 13:25   US Venous Img Lower Bilateral  Result Date: 06/30/2017 CLINICAL DATA:  Bilateral lower extremity pain and edema EXAM: BILATERAL LOWER EXTREMITY VENOUS DOPPLER ULTRASOUND TECHNIQUE: Gray-scale sonography with graded compression, as well as color Doppler and duplex ultrasound were performed to evaluate the lower extremity deep venous systems from the level of the common femoral vein and including the common femoral, femoral, profunda femoral, popliteal and calf veins including the posterior tibial, peroneal and gastrocnemius veins when visible. The superficial great saphenous vein was also interrogated. Spectral Doppler was utilized to evaluate flow at rest and with distal augmentation maneuvers in the common femoral, femoral and popliteal veins. COMPARISON:  None. FINDINGS: RIGHT LOWER EXTREMITY Common Femoral Vein: No evidence of thrombus. Normal compressibility, respiratory phasicity and response to augmentation. Saphenofemoral Junction: No evidence of thrombus. Normal compressibility and flow on color  Doppler imaging. Profunda Femoral Vein: No evidence of thrombus. Normal compressibility and flow on color Doppler imaging. Femoral Vein: No evidence of thrombus. Normal compressibility, respiratory phasicity and response to augmentation. Popliteal Vein: No evidence of thrombus. Normal compressibility, respiratory phasicity and response to augmentation. Calf Veins: No evidence of thrombus. Normal compressibility and flow on color Doppler imaging. Superficial Great Saphenous Vein: No evidence of thrombus. Normal compressibility and flow on color Doppler imaging. Marland Kitchen LEFT LOWER EXTREMITY Common Femoral Vein: No evidence of thrombus. Normal compressibility, respiratory phasicity and response to augmentation. Saphenofemoral Junction: No evidence of thrombus. Normal compressibility and flow on color Doppler imaging. Profunda Femoral Vein: No evidence of thrombus. Normal compressibility and flow on color Doppler imaging. Femoral Vein: No evidence of thrombus. Normal compressibility, respiratory phasicity and response to augmentation. Popliteal Vein: No evidence of thrombus. Normal compressibility, respiratory phasicity and response to augmentation. Calf Veins: No evidence of thrombus. Normal compressibility and flow on color Doppler imaging. Superficial Great Saphenous Vein: No evidence of thrombus. Normal compressibility and flow on color Doppler imaging. IMPRESSION: No evidence of DVT within either lower extremity. Electronically Signed   By: Genevive Bi M.D.   On: 06/30/2017 15:22   Dg Chest Port 1 View  Result Date: 06/30/2017 CLINICAL DATA:  Hypoxia EXAM: PORTABLE CHEST 1 VIEW COMPARISON:  June 29, 2017 FINDINGS: There is no edema or consolidation. The heart size and pulmonary vascularity are normal. No adenopathy. There is aortic atherosclerosis. No evident bone lesions. IMPRESSION: Aortic atherosclerosis.  No edema or consolidation. Aortic Atherosclerosis (ICD10-I70.0). Electronically Signed   By: Bretta Bang III M.D.   On: 06/30/2017 10:02   Dg Chest Port 1 View  Result Date: 06/29/2017 CLINICAL DATA:  Altered mental status. EXAM: PORTABLE CHEST 1 VIEW COMPARISON:  Abdominal series on 05/30/2017 FINDINGS: The heart size and mediastinal contours are within normal limits. Lung volumes are low bilaterally. There is no evidence of pulmonary edema, consolidation, pneumothorax, nodule or pleural fluid. The visualized skeletal structures are unremarkable. IMPRESSION: No active disease. Electronically Signed   By: Irish Lack M.D.   On: 06/29/2017 13:25   Dg Chest Port 1v Same Day  Result Date: 06/30/2017 CLINICAL DATA:  PICC line insertion. EXAM: PORTABLE CHEST 1 VIEW COMPARISON:  06/30/2017 . FINDINGS: PICC line noted with its tip projected over the SVC. Heart size normal. Low lung volumes with basilar atelectasis. No pleural effusion pneumothorax. IMPRESSION: PICC line noted with its tip projected over the SVC. Electronically Signed   By: Maisie Fus  Register  On: 06/30/2017 11:55    Sakib Noguez, DO  [facility] Pager [phone]  If 7PM-7AM, please contact night-coverage [email or web address] Password [email or web address] 07/02/2017, 5:24 PM   LOS: 3 days

## 2017-07-02 NOTE — Progress Notes (Signed)
Dr. Arbutus Leasat notified of elevated temp (100.2 axillary) and HR ranging from 115-140.

## 2017-07-03 LAB — CBC
HCT: 32.2 % — ABNORMAL LOW (ref 36.0–46.0)
Hemoglobin: 10.7 g/dL — ABNORMAL LOW (ref 12.0–15.0)
MCH: 30.3 pg (ref 26.0–34.0)
MCHC: 33.2 g/dL (ref 30.0–36.0)
MCV: 91.2 fL (ref 78.0–100.0)
PLATELETS: 346 10*3/uL (ref 150–400)
RBC: 3.53 MIL/uL — AB (ref 3.87–5.11)
RDW: 13.8 % (ref 11.5–15.5)
WBC: 15.1 10*3/uL — ABNORMAL HIGH (ref 4.0–10.5)

## 2017-07-03 LAB — BASIC METABOLIC PANEL
Anion gap: 9 (ref 5–15)
BUN: 17 mg/dL (ref 6–20)
CALCIUM: 8.7 mg/dL — AB (ref 8.9–10.3)
CO2: 23 mmol/L (ref 22–32)
CREATININE: 0.91 mg/dL (ref 0.44–1.00)
Chloride: 117 mmol/L — ABNORMAL HIGH (ref 101–111)
GFR calc Af Amer: >60 mL/min (ref >60)
GLUCOSE: 170 mg/dL — AB (ref 65–99)
POTASSIUM: 3.6 mmol/L (ref 3.5–5.1)
SODIUM: 149 mmol/L — AB (ref 135–145)

## 2017-07-03 LAB — GLUCOSE, CAPILLARY
GLUCOSE-CAPILLARY: 166 mg/dL — AB (ref 65–99)
GLUCOSE-CAPILLARY: 166 mg/dL — AB (ref 65–99)
Glucose-Capillary: 134 mg/dL — ABNORMAL HIGH (ref 65–99)
Glucose-Capillary: 147 mg/dL — ABNORMAL HIGH (ref 65–99)
Glucose-Capillary: 151 mg/dL — ABNORMAL HIGH (ref 65–99)
Glucose-Capillary: 178 mg/dL — ABNORMAL HIGH (ref 65–99)

## 2017-07-03 LAB — CLOSTRIDIUM DIFFICILE BY PCR: Toxigenic C. Difficile by PCR: POSITIVE — AB

## 2017-07-03 LAB — MRSA PCR SCREENING: MRSA BY PCR: NEGATIVE

## 2017-07-03 LAB — C DIFFICILE QUICK SCREEN W PCR REFLEX
C Diff antigen: POSITIVE — AB
C Diff toxin: NEGATIVE

## 2017-07-03 LAB — HEPARIN LEVEL (UNFRACTIONATED)
Heparin Unfractionated: 0.17 IU/mL — ABNORMAL LOW (ref 0.30–0.70)
Heparin Unfractionated: 1.43 IU/mL — ABNORMAL HIGH (ref 0.30–0.70)
Heparin Unfractionated: 0.41 IU/mL (ref 0.30–0.70)

## 2017-07-03 NOTE — Progress Notes (Addendum)
PROGRESS NOTE  Deborah Benjamin WUJ:[id number] DOB: 10/31/1941 DOA: 06/29/2017 PCP: Pearson Grippe, MD   Brief History: 76 year old female with a history of hypertension, hyperlipidemia, diabetes mellitus, and recently diagnosed atrial fibrillation presented with altered mental status. According to the patient's sister, the patient was "not right"at the time of her last hospital discharge. The patient was admitted to the hospital from 05/30/2017 through 05/31/2017 at which time, the patient was found to have new onset atrial fibrillation. The patient was discharged home with metoprolol and apixiban. Since discharge from the hospital, the patient has had a waxing and waning episodes of confusion which has worsened in the 24 hours prior to coming back to the emergency department. According to the patient's sister, the patient was "awful". In the emergency department, the patient was noted to have a low-grade temperature 99.3 with intermittent episodes of hypotension. She was found have atrial fibrillation with RVR with heart rate 143. WBC was 14.8 with lactic acid 1.59. In addition, her serum creatinine was elevated beyond her normal baseline.   Since admission, the patient's renal function has improved with intravenous fluids. Workup for reversible causes of encephalopathy was unremarkable. MRI of the brain was negative for acute findings. The patient was treated with IV antibiotics for 5 days for presumptive UTI. Unfortunately, the patient continued to remain encephalopathic and very somnolent. After discussion with the patient's sister, she was agreeable to transfer the patient to [facility] for further neuro workup  Assessment/Plan: Acute metabolic encephalopathy -Multifactorial including atrial fibrillation, AKI, UTI and hypernatremia -no improvement during hospitalization -concerned about autoimmune encephalopathy -CT brain negative -UA 6-30 WBC--culture unrevealing -Check serum  B12--4525 -ammonia--11 -TSH 0.416 -ABG--7.44/29/123/22 on 3 L -MR brain--negative for acute findings -case discussed with Neurology, Dr. Cindra Eves will see in consult when pt arrives to [facility] -EEG -may need LP--defer to neurology  Tremor/Rhythmic movements -includes jaw and bilateral UE -doubt generalized seizure as pt responds during episodes -?myoclonus vs focal seizure vs tremor -EEG  Atrial Fibrillation with RVR -doubt medication compliance at time of d/cincluding metoprolol and apixiban -Continue diltiazem drip and lopressor IV -discussed with Dr. Purvis Sheffield 7/6--if no improvement, ok to start amio drip -appreciate cardiology follow up-->amiodarone x 1 given 7/6 -CHADSVASc = 5 (HTN, Age, DM, female) -TSH 0.416 -05/31/2017 EchoEF 60-65%, mild MR, trivial TR, PASP 30 -continue IV heparin until pt able to take po reliably (on apixaban prior to admit)  Acute on chronic renal failure--CKD stage 3 -Secondary to volume depletion -Baseline creatinine 0.8-1.1  -Presenting creatinine 1.54 -Improved with IV fluids   -holding lisinopril  Sepsis -secondary to urine source and possible aspiration pneumonitis -continue zosyn  -06/30/17 repeat CXR--personally reviewed--no consolidation -lactic acid 1.59>>>1.4 -WBC gradually rising again, but remains afebrile and hemodynamically stable -blood cultures negative -urine culture--mulitple organisms  Pyuria/UTI -discontinue zosyn--has had 5 days of abx -culture--multiple organisms  Hypernatremia -d/c 1/2NS -start D5W  Essential hypertension  -The patient had intermittent episodes of hypotension which may been rate related due to her atrial fibrillation  -Blood pressure is improved and acceptable on diltiazem drip and lopressor -Doubt medication compliance after her last discharge  Lower extremity pain and edema  -Venous duplex--negative  Diabetes mellitus type 2  -Holding metformin  -05/30/2017 hemoglobin  A1c 6.6  -CBGs past 24 hours well controlled -continue insulin sliding scale  Elevated troponin -due to demand ischemia -personally reviewed EKG--no ST-T wave changes    Disposition Plan: Transfer to [facility]  Communication: updated sister at bedside on phone 07/03/17   Consultants: neurology  Code Status: DNR  DVT Prophylaxis: IVHeparin   Procedures: As Listed in Progress Note Above  Antibiotics: vanco 7/4>>>7/5 Zosyn 7/4>>>    Subjective: Patient responds "I'm okay" when asked how she was doing. The patient does not answer any other questions. She does open her eyes. No reports of vomiting, uncontrolled pain, respiratory distress, diarrhea.  Objective: Vitals:   07/03/17 0500 07/03/17 0530 07/03/17 0600 07/03/17 0720  BP: 121/71 (!) 136/94 124/72   Pulse: (!) 117 (!) 113 99 (!) 113  Resp: (!) 29 (!) 24 (!) 23 (!) 26  Temp:    99.2 F (37.3 C)  TempSrc:    Axillary  SpO2: 98% 96% 92% 96%  Weight: 93.8 kg (206 lb 12.7 oz)     Height:        Intake/Output Summary (Last 24 hours) at 07/03/17 0803 Last data filed at 07/03/17 0600  Gross per 24 hour  Intake          1285.75 ml  Output             1300 ml  Net           -14.25 ml   Weight change: 0.3 kg (10.6 oz) Exam:   General:  Pt is alert, follows commands appropriately, not in acute distress  HEENT: No icterus, No thrush, No neck mass, [place]/AT  Cardiovascular: RRR, S1/S2, no rubs, no gallops  Respiratory: Bibasilar crackles. No wheezing  Abdomen: Soft/+BS, non tender, non distended, no guarding  Extremities: 1 + LE edema, No lymphangitis, No petechiae, No rashes, no synovitis   Data Reviewed: I have personally reviewed following labs and imaging studies Basic Metabolic Panel:  Recent Labs Lab 06/29/17 1304 06/30/17 0359 07/01/17 0516 07/02/17 0450 07/03/17 0400  NA 144 145 147* 147* 149*  K 3.4* 3.3* 2.8* 3.4* 3.6  CL 105 114* 116* 116* 117*  CO2 20* 22 20*  21* 23  GLUCOSE 183* 150* 172* 160* 170*  BUN 51* 37* 27* 21* 17  CREATININE 1.54* 1.05* 1.18* 0.97 0.91  CALCIUM 10.2 8.5* 8.5* 8.6* 8.7*  MG  --   --  1.1* 1.8  --    Liver Function Tests:  Recent Labs Lab 06/29/17 1304  AST 21  ALT 20  ALKPHOS 65  BILITOT 1.5*  PROT 7.2  ALBUMIN 4.1   No results for input(s): LIPASE, AMYLASE in the last 168 hours.  Recent Labs Lab 06/30/17 0948  AMMONIA 11   Coagulation Profile:  Recent Labs Lab 06/29/17 2241  INR 1.07   CBC:  Recent Labs Lab 06/29/17 1304 06/30/17 0359 07/01/17 0516 07/02/17 0450 07/03/17 0400  WBC 14.8* 14.1* 11.1* 12.6* 15.1*  NEUTROABS 13.0*  --   --   --   --   HGB 12.0 11.2* 10.5* 10.7* 10.7*  HCT 34.3* 32.3* 30.4* 31.3* 32.2*  MCV 89.1 89.5 89.9 89.9 91.2  PLT 435* 357 351 351 346   Cardiac Enzymes:  Recent Labs Lab 06/29/17 1304 06/29/17 2241 06/30/17 0359 06/30/17 0948  TROPONINI 0.03* 0.06* 0.05* 0.04*   BNP: Invalid input(s): POCBNP CBG:  Recent Labs Lab 07/02/17 1608 07/02/17 1955 07/03/17 0008 07/03/17 0405 07/03/17 0719  GLUCAP 132* 144* 178* 166* 151*   HbA1C: No results for input(s): HGBA1C in the last 72 hours. Urine analysis:    Component Value Date/Time   COLORURINE AMBER (A) 06/29/2017 1342   APPEARANCEUR HAZY (A) 06/29/2017 1342  LABSPEC 1.019 06/29/2017 1342   PHURINE 5.0 06/29/2017 1342   GLUCOSEU NEGATIVE 06/29/2017 1342   HGBUR MODERATE (A) 06/29/2017 1342   BILIRUBINUR NEGATIVE 06/29/2017 1342   KETONESUR 5 (A) 06/29/2017 1342   PROTEINUR 30 (A) 06/29/2017 1342   UROBILINOGEN 0.2 03/06/2015 1213   NITRITE NEGATIVE 06/29/2017 1342   LEUKOCYTESUR MODERATE (A) 06/29/2017 1342   Sepsis Labs: @LABRCNTIP (procalcitonin:4,lacticidven:4) ) Recent Results (from the past 240 hour(s))  Blood Culture (routine x 2)     Status: None (Preliminary result)   Collection Time: 06/29/17  1:35 PM  Result Value Ref Range Status   Specimen Description BLOOD RIGHT  WRIST  Final   Special Requests   Final    BOTTLES DRAWN AEROBIC AND ANAEROBIC Blood Culture results may not be optimal due to an inadequate volume of blood received in culture bottles   Culture NO GROWTH 4 DAYS  Final   Report Status PENDING  Incomplete  Urine culture     Status: Abnormal   Collection Time: 06/29/17  1:42 PM  Result Value Ref Range Status   Specimen Description URINE, CLEAN CATCH  Final   Special Requests NONE  Final   Culture MULTIPLE SPECIES PRESENT, SUGGEST RECOLLECTION (A)  Final   Report Status 07/01/2017 FINAL  Final  Culture, blood (Routine X 2) w Reflex to ID Panel     Status: None (Preliminary result)   Collection Time: 06/29/17  3:55 PM  Result Value Ref Range Status   Specimen Description BLOOD LEFT HAND  Final   Special Requests   Final    Blood Culture results may not be optimal due to an inadequate volume of blood received in culture bottles   Culture NO GROWTH 4 DAYS  Final   Report Status PENDING  Incomplete  MRSA PCR Screening     Status: None   Collection Time: 06/29/17 10:05 PM  Result Value Ref Range Status   MRSA by PCR NEGATIVE NEGATIVE Final    Comment:        The GeneXpert MRSA Assay (FDA approved for NASAL specimens only), is one component of a comprehensive MRSA colonization surveillance program. It is not intended to diagnose MRSA infection nor to guide or monitor treatment for MRSA infections.      Scheduled Meds: . chlorhexidine  15 mL Mouth Rinse BID  . Chlorhexidine Gluconate Cloth  6 each Topical Daily  . insulin aspart  0-9 Units Subcutaneous Q4H  . mouth rinse  15 mL Mouth Rinse q12n4p  . metoprolol tartrate  2.5 mg Intravenous Q6H  . nystatin   Topical TID  . sodium chloride flush  10-40 mL Intracatheter Q12H   Continuous Infusions: . dextrose 5 % with KCl 20 mEq / L 20 mEq (07/02/17 2119)  . diltiazem (CARDIZEM) infusion 15 mg/hr (07/03/17 0308)  . heparin 800 Units/hr (07/02/17 0040)  . piperacillin-tazobactam  (ZOSYN)  IV 3.375 g (07/03/17 0739)    Procedures/Studies: Ct Head Wo Contrast  Result Date: 06/29/2017 CLINICAL DATA:  Confusion for 1 week. EXAM: CT HEAD WITHOUT CONTRAST TECHNIQUE: Contiguous axial images were obtained from the base of the skull through the vertex without intravenous contrast. COMPARISON:  Head CT scan 05/30/2017 in 03/06/2015. FINDINGS: Brain: There is some chronic microvascular ischemic change. No evidence of acute intracranial abnormality including hemorrhage, infarct, mass lesion, mass effect, midline shift or abnormal extra-axial fluid collection. No hydrocephalus or pneumocephalus. Vascular: Atherosclerosis noted. Skull: Intact. Sinuses/Orbits: Negative. Other: None. IMPRESSION: No acute abnormality. Atherosclerosis. Mild appearing chronic  microvascular ischemic change. Electronically Signed   By: Drusilla Kanner M.D.   On: 06/29/2017 16:25   Mr Brain Wo Contrast  Result Date: 07/01/2017 CLINICAL DATA:  Acute encephalopathy. Concern for acute stroke in the setting of atrial fibrillation. EXAM: MRI HEAD WITHOUT CONTRAST TECHNIQUE: Multiplanar, multiecho pulse sequences of the brain and surrounding structures were obtained without intravenous contrast. COMPARISON:  Head CT from 2 days ago.  Brain MRI 12/05/2009 FINDINGS: Brain: No acute infarction, hemorrhage, hydrocephalus, extra-axial collection or mass lesion. Cerebral white matter disease attributed to chronic small vessel ischemia in this patient with multiple vascular risk factors. There has also been progressive subcortical white matter involvement. Chronic cystic intensity spaces lateral to the atrium of the right lateral ventricle, nonspecific. Brain volume remains normal for age. No evidence of chronic blood products. Vascular: Major flow voids are preserved. Skull and upper cervical spine: No evidence of marrow lesion Sinuses/Orbits: Negative Other: Intermittently motion degraded. IMPRESSION: 1. No acute or reversible  finding. 2. 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 Electronically Signed   By: Marnee Spring M.D.   On: 07/01/2017 13:25   US Venous Img Lower Bilateral  Result Date: 06/30/2017 CLINICAL DATA:  Bilateral lower extremity pain and edema EXAM: BILATERAL LOWER EXTREMITY VENOUS DOPPLER ULTRASOUND TECHNIQUE: Gray-scale sonography with graded compression, as well as color Doppler and duplex ultrasound were performed to evaluate the lower extremity deep venous systems from the level of the common femoral vein and including the common femoral, femoral, profunda femoral, popliteal and calf veins including the posterior tibial, peroneal and gastrocnemius veins when visible. The superficial great saphenous vein was also interrogated. Spectral Doppler was utilized to evaluate flow at rest and with distal augmentation maneuvers in the common femoral, femoral and popliteal veins. COMPARISON:  None. FINDINGS: RIGHT LOWER EXTREMITY Common Femoral Vein: No evidence of thrombus. Normal compressibility, respiratory phasicity and response to augmentation. Saphenofemoral Junction: No evidence of thrombus. Normal compressibility and flow on color Doppler imaging. Profunda Femoral Vein: No evidence of thrombus. Normal compressibility and flow on color Doppler imaging. Femoral Vein: No evidence of thrombus. Normal compressibility, respiratory phasicity and response to augmentation. Popliteal Vein: No evidence of thrombus. Normal compressibility, respiratory phasicity and response to augmentation. Calf Veins: No evidence of thrombus. Normal compressibility and flow on color Doppler imaging. Superficial Great Saphenous Vein: No evidence of thrombus. Normal compressibility and flow on color Doppler imaging. Marland Kitchen LEFT LOWER EXTREMITY Common Femoral Vein: No evidence of thrombus. Normal compressibility, respiratory phasicity and response to augmentation. Saphenofemoral Junction: No evidence  of thrombus. Normal compressibility and flow on color Doppler imaging. Profunda Femoral Vein: No evidence of thrombus. Normal compressibility and flow on color Doppler imaging. Femoral Vein: No evidence of thrombus. Normal compressibility, respiratory phasicity and response to augmentation. Popliteal Vein: No evidence of thrombus. Normal compressibility, respiratory phasicity and response to augmentation. Calf Veins: No evidence of thrombus. Normal compressibility and flow on color Doppler imaging. Superficial Great Saphenous Vein: No evidence of thrombus. Normal compressibility and flow on color Doppler imaging. IMPRESSION: No evidence of DVT within either lower extremity. Electronically Signed   By: Genevive Bi M.D.   On: 06/30/2017 15:22   Dg Chest Port 1 View  Result Date: 06/30/2017 CLINICAL DATA:  Hypoxia EXAM: PORTABLE CHEST 1 VIEW COMPARISON:  June 29, 2017 FINDINGS: There is no edema or consolidation. The heart size and pulmonary vascularity are normal. No adenopathy. There is aortic atherosclerosis. No evident bone lesions. IMPRESSION:  Aortic atherosclerosis.  No edema or consolidation. Aortic Atherosclerosis (ICD10-I70.0). Electronically Signed   By: Bretta BangWilliam  Woodruff III M.D.   On: 06/30/2017 10:02   Dg Chest Port 1 View  Result Date: 06/29/2017 CLINICAL DATA:  Altered mental status. EXAM: PORTABLE CHEST 1 VIEW COMPARISON:  Abdominal series on 05/30/2017 FINDINGS: The heart size and mediastinal contours are within normal limits. Lung volumes are low bilaterally. There is no evidence of pulmonary edema, consolidation, pneumothorax, nodule or pleural fluid. The visualized skeletal structures are unremarkable. IMPRESSION: No active disease. Electronically Signed   By: Irish LackGlenn  Yamagata M.D.   On: 06/29/2017 13:25   Dg Chest Port 1v Same Day  Result Date: 06/30/2017 CLINICAL DATA:  PICC line insertion. EXAM: PORTABLE CHEST 1 VIEW COMPARISON:  06/30/2017 . FINDINGS: PICC line noted with its tip  projected over the SVC. Heart size normal. Low lung volumes with basilar atelectasis. No pleural effusion pneumothorax. IMPRESSION: PICC line noted with its tip projected over the SVC. Electronically Signed   By: Maisie Fushomas  Register   On: 06/30/2017 11:55    Kelilah Hebard, DO  [facility] Pager [phone]  If 7PM-7AM, please contact night-coverage [email or web address] Password [email or web address] 07/03/2017, 8:03 AM   LOS: 4 days

## 2017-07-03 NOTE — Plan of Care (Signed)
Problem: Pain Managment: Goal: General experience of comfort will improve Outcome: Progressing Staff using painad to assess pain level.

## 2017-07-03 NOTE — Progress Notes (Signed)
ANTICOAGULATION CONSULT NOTE - follow up  Pharmacy Consult for HEPARIN Indication: atrial fibrillation  No Known Allergies  Patient Measurements: Height: 5\' 5"  (165.1 cm) Weight: 203 lb 7.8 oz (92.3 kg) IBW/kg (Calculated) : 57  Vital Signs: Temp: 98.7 F (37.1 C) (07/08 1614) Temp Source: Oral (07/08 1614) BP: 136/96 (07/08 1614) Pulse Rate: 99 (07/08 1614)  Labs:  Recent Labs  06/30/17 2051  07/01/17 0516 07/02/17 0450 07/03/17 0400 07/03/17 1501 07/03/17 1751  HGB  --   < > 10.5* 10.7* 10.7*  --   --   HCT  --   --  30.4* 31.3* 32.2*  --   --   PLT  --   --  351 351 346  --   --   APTT >200*  --   --   --   --   --   --   HEPARINUNFRC 0.80*  --  0.59 0.48 0.17* 1.43* 0.41  CREATININE  --   --  1.18* 0.97 0.91  --   --   < > = values in this interval not displayed. Estimated Creatinine Clearance: 60.9 mL/min (by C-G formula based on SCr of 0.91 mg/dL).  Assessment:  76 year old female continues on heparin for Afib Transferred from [facility] earlier today   Goal of Therapy:  Heparin level 0.3-0.7 units/ml Monitor platelets by anticoagulation protocol: Yes   Plan:  Continue heparin at 900 units / hr Follow up AM labs  Thank you Okey RegalLisa Brizza Nathanson, PharmD [phone] 07/03/2017,6:34 PM

## 2017-07-03 NOTE — Consult Note (Signed)
NEURO HOSPITALIST CONSULT NOTE    Reason for Consult:altered mental stattus  HPI:                                                                                                                                          Deborah Benjamin is an 76 y.o. female with a PMH significant for hypertension, hyperlipidemia, diabetes mellitus, and newly diagnosed atrial fibrillation on apixaban, admitted to the hospital due to altered mental status. Patient is lethargic ad thus unable to contribute to her medical information, but as per chart review: " According to the patient's sister, the patient was "not right"at the time of her last hospital discharge. The patient was admitted to the hospital from 05/30/2017 through 05/31/2017 at which time, the patient was found to have new onset atrial fibrillation. The patient was discharged home with metoprolol and apixiban. Since discharge from the hospital, the patient has had a waxing and waning episodes of confusion which has worsened in the 24 hours prior to coming back to the emergency department. According to the patient's sister, the patient was "awful". In the emergency department, the patient was noted to have a low-grade temperature 99.3 with intermittent episodes of hypotension. She was found have atrial fibrillation with RVR with heart rate 143. WBC was 14.8 with lactic acid 1.59. In addition, her serum creatinine was elevated beyond her normal baseline. Since admission, the patient's renal function has improved with intravenous fluids. Workup for reversible causes of encephalopathy was unremarkable. MRI of the brain was negative for acute findings. The patient was treated with IV antibiotics for 5 days for presumptive UTI. Unfortunately, the patient continued to remain encephalopathic and very somnolent. After discussion with the patient's sister, she was agreeable to transfer the patient to [facility] for further neuro workup". Presently, patient is  lethargic and doesn't participate on the exam. MRI brain was independently reviewed and showed no acute abnormality or other lesions that could explain her presentation.   Past Medical History:  Diagnosis Date  . Atrial fibrillation ([place])   . Diabetes mellitus without complication ([place])   . High cholesterol   . Hypertension   . Vertigo     Past Surgical History:  Procedure Laterality Date  . ABDOMINAL HYSTERECTOMY    . CHOLECYSTECTOMY    . CYST EXCISION      Family History  Problem Relation Age of Onset  . Diabetes Sister   . CVA Mother 56  . CAD Father        36    Family History: unable to obtain due to mental status.   Social History:  reports that she has never smoked. She has never used smokeless tobacco. She reports that she does not drink alcohol or use drugs.  No Known Allergies  MEDICATIONS:                                                                                                                     Scheduled: . chlorhexidine  15 mL Mouth Rinse BID  . Chlorhexidine Gluconate Cloth  6 each Topical Daily  . insulin aspart  0-9 Units Subcutaneous Q4H  . mouth rinse  15 mL Mouth Rinse q12n4p  . metoprolol tartrate  2.5 mg Intravenous Q6H  . nystatin   Topical TID  . sodium chloride flush  10-40 mL Intracatheter Q12H     ROS: can not be obtained due to mental status.                                                                                                                                     History obtained from chart review and unobtainable from patient due to mental status  Physical exam:  Constitutional: well developed, lethargic female in no apparent distress. Blood pressure 128/83, pulse 89, temperature 98.8 F (37.1 C), temperature source Axillary, resp. rate 20, height '5\' 5"'$  (1.651 m), weight 92.3 kg (203 lb 7.8 oz), SpO2 100 %. Eyes: no jaundice or exophthalmos.  Head: normocephalic. Neck: supple, no bruits, no JVD. Cardiac: no  murmurs. Lungs: clear. Abdomen: soft, no tender, no mass. Extremities: bilateral LE edema edema, no clubbing, or cyanosis.  Skin: no rash Neurologic Examination:                                                                                                       Mental Status: limited exam due to mental status Lethargic, looks encephalopathic. Cranial Nerves: Resists eye opening but pupils seem to be symmetric and reactive. Unable to reliably test VF. No gaze preference. Face seems symmetric. Tongue central. Motor: Minimal spontaneous motor movements Tone and bulk:normal tone throughout; no atrophy noted Sensory:  Withdraw all extremities to painful stimulation Deep Tendon Reflexes:  1 all over Plantars: Right: downgoing   Left: downgoing Cerebellar: Unable  to test due to mental status Gait:  Unable to test due to mental status    Lab Results  Component Value Date/Time   CHOL 148 05/31/2017 05:12 AM    Results for orders placed or performed during the hospital encounter of 06/29/17 (from the past 48 hour(s))  Glucose, capillary     Status: Abnormal   Collection Time: 07/02/17 12:32 AM  Result Value Ref Range   Glucose-Capillary 149 (H) 65 - 99 mg/dL   Comment 1 Notify RN    Comment 2 Document in Chart   Glucose, capillary     Status: Abnormal   Collection Time: 07/02/17  4:30 AM  Result Value Ref Range   Glucose-Capillary 157 (H) 65 - 99 mg/dL   Comment 1 Notify RN    Comment 2 Document in Chart   Heparin level (unfractionated)     Status: None   Collection Time: 07/02/17  4:50 AM  Result Value Ref Range   Heparin Unfractionated 0.48 0.30 - 0.70 IU/mL    Comment:        IF HEPARIN RESULTS ARE BELOW EXPECTED VALUES, AND PATIENT DOSAGE HAS BEEN CONFIRMED, SUGGEST FOLLOW UP TESTING OF ANTITHROMBIN III LEVELS.   CBC     Status: Abnormal   Collection Time: 07/02/17  4:50 AM  Result Value Ref Range   WBC 12.6 (H) 4.0 - 10.5 K/uL   RBC 3.48 (L) 3.87 - 5.11 MIL/uL    Hemoglobin 10.7 (L) 12.0 - 15.0 g/dL   HCT 31.3 (L) 36.0 - 46.0 %   MCV 89.9 78.0 - 100.0 fL   MCH 30.7 26.0 - 34.0 pg   MCHC 34.2 30.0 - 36.0 g/dL   RDW 13.9 11.5 - 15.5 %   Platelets 351 150 - 400 K/uL  Basic metabolic panel     Status: Abnormal   Collection Time: 07/02/17  4:50 AM  Result Value Ref Range   Sodium 147 (H) 135 - 145 mmol/L   Potassium 3.4 (L) 3.5 - 5.1 mmol/L    Comment: DELTA CHECK NOTED NO VISIBLE HEMOLYSIS    Chloride 116 (H) 101 - 111 mmol/L   CO2 21 (L) 22 - 32 mmol/L   Glucose, Bld 160 (H) 65 - 99 mg/dL   BUN 21 (H) 6 - 20 mg/dL   Creatinine, Ser 0.97 0.44 - 1.00 mg/dL   Calcium 8.6 (L) 8.9 - 10.3 mg/dL   GFR calc non Af Amer 56 (L) >60 mL/min   GFR calc Af Amer >60 >60 mL/min    Comment: (NOTE) The eGFR has been calculated using the CKD EPI equation. This calculation has not been validated in all clinical situations. eGFR's persistently <60 mL/min signify possible Chronic Kidney Disease.    Anion gap 10 5 - 15  Magnesium     Status: None   Collection Time: 07/02/17  4:50 AM  Result Value Ref Range   Magnesium 1.8 1.7 - 2.4 mg/dL  Glucose, capillary     Status: Abnormal   Collection Time: 07/02/17  7:21 AM  Result Value Ref Range   Glucose-Capillary 141 (H) 65 - 99 mg/dL  Glucose, capillary     Status: Abnormal   Collection Time: 07/02/17 11:25 AM  Result Value Ref Range   Glucose-Capillary 163 (H) 65 - 99 mg/dL   Comment 1 Notify RN   Glucose, capillary     Status: Abnormal   Collection Time: 07/02/17  4:08 PM  Result Value Ref Range   Glucose-Capillary 132 (H) 65 -  99 mg/dL  Blood gas, arterial     Status: Abnormal   Collection Time: 07/02/17  4:50 PM  Result Value Ref Range   O2 Content 3.0 L/min   Delivery systems NASAL CANNULA    pH, Arterial 7.449 7.350 - 7.450   pCO2 arterial 29.3 (L) 32.0 - 48.0 mmHg   pO2, Arterial 123 (H) 83.0 - 108.0 mmHg   Bicarbonate 22.2 20.0 - 28.0 mmol/L   Acid-base deficit 3.3 (H) 0.0 - 2.0 mmol/L    O2 Saturation 98.7 %   Patient temperature 37.0    Collection site BRACHIAL ARTERY    Drawn by [id number]    Sample type ARTERIAL    Allens test (pass/fail) NOT INDICATED (A) PASS  Glucose, capillary     Status: Abnormal   Collection Time: 07/02/17  7:55 PM  Result Value Ref Range   Glucose-Capillary 144 (H) 65 - 99 mg/dL   Comment 1 Notify RN    Comment 2 Document in Chart   Glucose, capillary     Status: Abnormal   Collection Time: 07/03/17 12:08 AM  Result Value Ref Range   Glucose-Capillary 178 (H) 65 - 99 mg/dL   Comment 1 Notify RN   Heparin level (unfractionated)     Status: Abnormal   Collection Time: 07/03/17  4:00 AM  Result Value Ref Range   Heparin Unfractionated 0.17 (L) 0.30 - 0.70 IU/mL    Comment:        IF HEPARIN RESULTS ARE BELOW EXPECTED VALUES, AND PATIENT DOSAGE HAS BEEN CONFIRMED, SUGGEST FOLLOW UP TESTING OF ANTITHROMBIN III LEVELS.   CBC     Status: Abnormal   Collection Time: 07/03/17  4:00 AM  Result Value Ref Range   WBC 15.1 (H) 4.0 - 10.5 K/uL   RBC 3.53 (L) 3.87 - 5.11 MIL/uL   Hemoglobin 10.7 (L) 12.0 - 15.0 g/dL   HCT 32.2 (L) 36.0 - 46.0 %   MCV 91.2 78.0 - 100.0 fL   MCH 30.3 26.0 - 34.0 pg   MCHC 33.2 30.0 - 36.0 g/dL   RDW 13.8 11.5 - 15.5 %   Platelets 346 150 - 400 K/uL  Basic metabolic panel     Status: Abnormal   Collection Time: 07/03/17  4:00 AM  Result Value Ref Range   Sodium 149 (H) 135 - 145 mmol/L   Potassium 3.6 3.5 - 5.1 mmol/L   Chloride 117 (H) 101 - 111 mmol/L   CO2 23 22 - 32 mmol/L   Glucose, Bld 170 (H) 65 - 99 mg/dL   BUN 17 6 - 20 mg/dL   Creatinine, Ser 0.91 0.44 - 1.00 mg/dL   Calcium 8.7 (L) 8.9 - 10.3 mg/dL   GFR calc non Af Amer >60 >60 mL/min   GFR calc Af Amer >60 >60 mL/min    Comment: (NOTE) The eGFR has been calculated using the CKD EPI equation. This calculation has not been validated in all clinical situations. eGFR's persistently <60 mL/min signify possible Chronic Kidney Disease.    Anion  gap 9 5 - 15  Glucose, capillary     Status: Abnormal   Collection Time: 07/03/17  4:05 AM  Result Value Ref Range   Glucose-Capillary 166 (H) 65 - 99 mg/dL   Comment 1 Notify RN   Glucose, capillary     Status: Abnormal   Collection Time: 07/03/17  7:19 AM  Result Value Ref Range   Glucose-Capillary 151 (H) 65 - 99 mg/dL  Glucose, capillary  Status: Abnormal   Collection Time: 07/03/17 11:20 AM  Result Value Ref Range   Glucose-Capillary 166 (H) 65 - 99 mg/dL  MRSA PCR Screening     Status: None   Collection Time: 07/03/17  2:29 PM  Result Value Ref Range   MRSA by PCR NEGATIVE NEGATIVE    Comment:        The GeneXpert MRSA Assay (FDA approved for NASAL specimens only), is one component of a comprehensive MRSA colonization surveillance program. It is not intended to diagnose MRSA infection nor to guide or monitor treatment for MRSA infections.   Heparin level (unfractionated)     Status: Abnormal   Collection Time: 07/03/17  3:01 PM  Result Value Ref Range   Heparin Unfractionated 1.43 (H) 0.30 - 0.70 IU/mL    Comment:        IF HEPARIN RESULTS ARE BELOW EXPECTED VALUES, AND PATIENT DOSAGE HAS BEEN CONFIRMED, SUGGEST FOLLOW UP TESTING OF ANTITHROMBIN III LEVELS. RESULTS CONFIRMED BY MANUAL DILUTION   Glucose, capillary     Status: Abnormal   Collection Time: 07/03/17  4:12 PM  Result Value Ref Range   Glucose-Capillary 147 (H) 65 - 99 mg/dL  C difficile quick scan w PCR reflex     Status: Abnormal   Collection Time: 07/03/17  5:27 PM  Result Value Ref Range   C Diff antigen POSITIVE (A) NEGATIVE   C Diff toxin NEGATIVE NEGATIVE   C Diff interpretation Results are indeterminate. See PCR results.   Clostridium Difficile by PCR     Status: Abnormal   Collection Time: 07/03/17  5:27 PM  Result Value Ref Range   Toxigenic C Difficile by pcr POSITIVE (A) NEGATIVE    Comment: Positive for toxigenic C. difficile with little to no toxin production. Only treat if  clinical presentation suggests symptomatic illness.  Heparin level (unfractionated)     Status: None   Collection Time: 07/03/17  5:51 PM  Result Value Ref Range   Heparin Unfractionated 0.41 0.30 - 0.70 IU/mL    Comment:        IF HEPARIN RESULTS ARE BELOW EXPECTED VALUES, AND PATIENT DOSAGE HAS BEEN CONFIRMED, SUGGEST FOLLOW UP TESTING OF ANTITHROMBIN III LEVELS.   Glucose, capillary     Status: Abnormal   Collection Time: 07/03/17  7:58 PM  Result Value Ref Range   Glucose-Capillary 134 (H) 65 - 99 mg/dL    No results found.  Assessment/Plan:  76 y.o. female with a PMH significant for hypertension, hyperlipidemia, diabetes mellitus, and newly diagnosed atrial fibrillation on apixaban, admitted to the hospital due to altered mental status. Neuro-exam is compromised by AMS but can not see ostensible lateralizing neurological signs. MRI brain without acute abnormality. Suspect toxic metabolic encephalopathy, less likely CNS infection at this time thus will defer LP. Agree with EEG to address degree of encephalopathy and possibility of NCSE. Inpatient neurology team will resume care tomorrow.  Dorian Pod, MD [facility]. 07/03/2017, 10:18 PM

## 2017-07-03 NOTE — Progress Notes (Signed)
Hand off report given to HeraldRemonda, Charity fundraiserN on 22M [facility].  [facility] in the care of [place], [place] after handoff report given to her.

## 2017-07-03 NOTE — Progress Notes (Signed)
ANTICOAGULATION CONSULT NOTE - follow up  Pharmacy Consult for HEPARIN Indication: atrial fibrillation  Allergies  Allergen Reactions  . No Known Allergies     Patient Measurements: Height: 5\' 6"  (167.6 cm) Weight: 206 lb 12.7 oz (93.8 kg) IBW/kg (Calculated) : 59.3  Vital Signs: Temp: 99.2 F (37.3 C) (07/08 0720) Temp Source: Axillary (07/08 0720) BP: 124/72 (07/08 0600) Pulse Rate: 113 (07/08 0720)  Labs:  Recent Labs  06/30/17 0948  06/30/17 2051  07/01/17 0516 07/02/17 0450 07/03/17 0400  HGB  --   --   --   < > 10.5* 10.7* 10.7*  HCT  --   --   --   --  30.4* 31.3* 32.2*  PLT  --   --   --   --  351 351 346  APTT  --   --  >200*  --   --   --   --   HEPARINUNFRC  --   < > 0.80*  --  0.59 0.48 0.17*  CREATININE  --   --   --   --  1.18* 0.97 0.91  TROPONINI 0.04*  --   --   --   --   --   --   < > = values in this interval not displayed. Estimated Creatinine Clearance: 62.6 mL/min (by C-G formula based on SCr of 0.91 mg/dL).  Medical History: Past Medical History:  Diagnosis Date  . Atrial fibrillation ([facility])   . Diabetes mellitus without complication ([facility])   . High cholesterol   . Hypertension   . Vertigo     Medications:  Prescriptions Prior to Admission  Medication Sig Dispense Refill Last Dose  . apixaban (ELIQUIS) 5 MG TABS tablet Take 1 tablet (5 mg total) by mouth 2 (two) times daily. 60 tablet 1 Past Week at Unknown time  . cholecalciferol (VITAMIN D) 1000 units tablet Take 1,000 Units by mouth daily.   Past Week at Unknown time  . meclizine (ANTIVERT) 25 MG tablet Take 25 mg by mouth 4 (four) times daily as needed for dizziness.   Past Week at Unknown time  . metoprolol tartrate 37.5 MG TABS Take 37.5 mg by mouth 2 (two) times daily. 60 tablet 0 Past Week at Unknown time  . Multiple Vitamin (MULTIVITAMIN WITH MINERALS) TABS Take 1 tablet by mouth daily.   Past Week at Unknown time  . ondansetron (ZOFRAN) 4 MG tablet Take 4 mg by mouth 3 (three)  times daily.   Past Week at Unknown time  . vitamin B-12 (CYANOCOBALAMIN) 1000 MCG tablet Take 1,000 mcg by mouth daily.    Past Week at Unknown time  . acetaminophen (TYLENOL) 500 MG tablet Take 500 mg by mouth daily as needed for pain.   unknown  . atorvastatin (LIPITOR) 40 MG tablet Take 40 mg by mouth every morning.    05/30/2017 at Unknown time  . lisinopril (PRINIVIL,ZESTRIL) 20 MG tablet Take 1 tablet (20 mg total) by mouth daily. 30 tablet 0   . metFORMIN (GLUCOPHAGE-XR) 500 MG 24 hr tablet Take 1 tablet by mouth 2 (two) times daily.   05/30/2017 at Unknown time  . omeprazole (PRILOSEC) 20 MG capsule Take 20 mg by mouth daily.   05/29/2017 at Unknown time   Assessment:  Heparin level below goal this morning.  No signs of bleeding.   Goal of Therapy:  Heparin level 0.3-0.7 units/ml Monitor platelets by anticoagulation protocol: Yes   Plan:   Increase  heparin to  900 units/hr  Heparin level in 6 hours   Heparin level and CBC daily  Raquel JamesPittman, Norval Slaven Bennett 07/03/2017,8:36 AM

## 2017-07-04 ENCOUNTER — Inpatient Hospital Stay (HOSPITAL_COMMUNITY): Payer: Medicare Other

## 2017-07-04 DIAGNOSIS — G9340 Encephalopathy, unspecified: Secondary | ICD-10-CM

## 2017-07-04 LAB — COMPREHENSIVE METABOLIC PANEL
ALBUMIN: 2.4 g/dL — AB (ref 3.5–5.0)
ALK PHOS: 69 U/L (ref 38–126)
ALT: 49 U/L (ref 14–54)
AST: 41 U/L (ref 15–41)
Anion gap: 8 (ref 5–15)
BILIRUBIN TOTAL: 0.4 mg/dL (ref 0.3–1.2)
BUN: 12 mg/dL (ref 6–20)
CALCIUM: 8.4 mg/dL — AB (ref 8.9–10.3)
CO2: 23 mmol/L (ref 22–32)
CREATININE: 0.82 mg/dL (ref 0.44–1.00)
Chloride: 112 mmol/L — ABNORMAL HIGH (ref 101–111)
GFR calc Af Amer: >60 mL/min (ref >60)
GLUCOSE: 162 mg/dL — AB (ref 65–99)
POTASSIUM: 3.1 mmol/L — AB (ref 3.5–5.1)
Sodium: 143 mmol/L (ref 135–145)
TOTAL PROTEIN: 4.9 g/dL — AB (ref 6.5–8.1)

## 2017-07-04 LAB — CULTURE, BLOOD (ROUTINE X 2)
CULTURE: NO GROWTH
Culture: NO GROWTH

## 2017-07-04 LAB — CBC
HEMATOCRIT: 32.4 % — AB (ref 36.0–46.0)
HEMOGLOBIN: 10.6 g/dL — AB (ref 12.0–15.0)
MCH: 29.7 pg (ref 26.0–34.0)
MCHC: 32.7 g/dL (ref 30.0–36.0)
MCV: 90.8 fL (ref 78.0–100.0)
Platelets: 314 10*3/uL (ref 150–400)
RBC: 3.57 MIL/uL — ABNORMAL LOW (ref 3.87–5.11)
RDW: 13.7 % (ref 11.5–15.5)
WBC: 15.2 10*3/uL — AB (ref 4.0–10.5)

## 2017-07-04 LAB — GLUCOSE, CAPILLARY
GLUCOSE-CAPILLARY: 125 mg/dL — AB (ref 65–99)
GLUCOSE-CAPILLARY: 154 mg/dL — AB (ref 65–99)
GLUCOSE-CAPILLARY: 155 mg/dL — AB (ref 65–99)
GLUCOSE-CAPILLARY: 158 mg/dL — AB (ref 65–99)
GLUCOSE-CAPILLARY: 175 mg/dL — AB (ref 65–99)
Glucose-Capillary: 146 mg/dL — ABNORMAL HIGH (ref 65–99)

## 2017-07-04 LAB — HEPARIN LEVEL (UNFRACTIONATED): HEPARIN UNFRACTIONATED: 0.4 [IU]/mL (ref 0.30–0.70)

## 2017-07-04 MED ORDER — METRONIDAZOLE IN NACL 5-0.79 MG/ML-% IV SOLN
500.0000 mg | Freq: Three times a day (TID) | INTRAVENOUS | Status: DC
  Administered 2017-07-04 – 2017-07-10 (×17): 500 mg via INTRAVENOUS
  Filled 2017-07-04 (×17): qty 100

## 2017-07-04 MED ORDER — VANCOMYCIN 50 MG/ML ORAL SOLUTION
500.0000 mg | Freq: Four times a day (QID) | ORAL | Status: DC
  Administered 2017-07-04 – 2017-07-06 (×5): 500 mg via ORAL
  Administered 2017-07-06: 250 mg via ORAL
  Administered 2017-07-07 – 2017-07-11 (×16): 500 mg via ORAL
  Filled 2017-07-04 (×29): qty 10

## 2017-07-04 MED ORDER — POLYVINYL ALCOHOL 1.4 % OP SOLN: 1.0000 [drp] | Freq: Four times a day (QID) | OPHTHALMIC | Status: DC | PRN

## 2017-07-04 MED ORDER — POTASSIUM CHLORIDE CRYS ER 20 MEQ PO TBCR
40.0000 meq | EXTENDED_RELEASE_TABLET | Freq: Once | ORAL | Status: DC
  Filled 2017-07-04: qty 2

## 2017-07-04 MED ORDER — POTASSIUM CHLORIDE 10 MEQ/100ML IV SOLN
10.0000 meq | INTRAVENOUS | Status: AC
  Administered 2017-07-04 (×3): 10 meq via INTRAVENOUS
  Filled 2017-07-04 (×3): qty 100

## 2017-07-04 NOTE — Procedures (Signed)
ELECTROENCEPHALOGRAM REPORT  Date of Study: 07/04/2017  Patient's Name: Deborah Benjamin MRN: [id number] Date of Birth: 1941/07/23  Referring Provider: Dr. Onalee Huaavid Tat  Clinical History: This is a 76 year old woman with altered mental status.  Medications: acetaminophen (TYLENOL) tablet 650 mg  dextrose 5 % with KCl 20 mEq / L infusion  diltiazem (CARDIZEM) 100 mg in dextrose 5 % 100 mL (1 mg/mL) infusion  heparin ADULT infusion 100 units/mL (25000 units/24850mL sodium chloride 0.45%)  insulin aspart (novoLOG) injection 0-9 Units  LORazepam (ATIVAN) injection 0.5 mg  metoprolol tartrate (LOPRESSOR) injection 2.5 mg  nystatin (MYCOSTATIN/NYSTOP) topical powder  ondansetron (ZOFRAN) injection 4 mg   Technical Summary: A multichannel digital EEG recording measured by the international 10-20 system with electrodes applied with paste and impedances below 5000 ohms performed as portable with EKG monitoring in an awake and asleep patient.  Hyperventilation and photic stimulation were not performed.  The digital EEG was referentially recorded, reformatted, and digitally filtered in a variety of bipolar and referential montages for optimal display.   Description: The patient is minimally responsive during the recording.  There is no clear posterior dominant rhythm. The background consists of a large amount of diffuse 4-5 Hz theta and 2-3 Hz delta slowing with occasional triphasic waves seen. During drowsiness and sleep, there is an increase in theta and delta slowing of the background with poorly formed vertex waves seen.  Hyperventilation and photic stimulation were not performed.  There were no epileptiform discharges or electrographic seizures seen.    EKG lead showed irregular rhythm.  Impression: This EEG is abnormal due to moderate diffuse slowing of the waking background with triphasic waves seen.  Clinical Correlation of the above findings indicates diffuse cerebral dysfunction that is  non-specific in etiology and can be seen with hypoxic/ischemic injury, toxic/metabolic encephalopathies, or medication effect. Triphasic waves are typically seen with hepatic encephalopathy, but may be seen with other metabolic encephalopathies as well.  The absence of epileptiform discharges does not rule out a clinical diagnosis of epilepsy.  Clinical correlation is advised.   Patrcia DollyKaren Aquino, M.D.

## 2017-07-04 NOTE — Progress Notes (Signed)
ANTICOAGULATION CONSULT NOTE - Follow up  Pharmacy Consult for Heparin Indication: atrial fibrillation  No Known Allergies  Patient Measurements: Height: 5\' 5"  (165.1 cm) Weight: 203 lb 7.8 oz (92.3 kg) IBW/kg (Calculated) : 57  Vital Signs: Temp: 98 F (36.7 C) (07/09 0729) Temp Source: Axillary (07/09 0729) BP: 100/70 (07/09 0729) Pulse Rate: 88 (07/09 0729)  Labs:  Recent Labs  07/02/17 0450 07/03/17 0400 07/03/17 1501 07/03/17 1751 07/04/17 0211  HGB 10.7* 10.7*  --   --  10.6*  HCT 31.3* 32.2*  --   --  32.4*  PLT 351 346  --   --  314  HEPARINUNFRC 0.48 0.17* 1.43* 0.41 0.40  CREATININE 0.97 0.91  --   --  0.82   Estimated Creatinine Clearance: 67.6 mL/min (by C-G formula based on SCr of 0.82 mg/dL).  Assessment:  [age] YOF with recent diagnosis of Afib earlier in June and was on apixaban PTA however with questionable compliance admitted on with AMS and transitioned to heparin for anticoagulation until po intake more reliable.   Heparin level this morning remains therapeutic (HL 0.4, goal of 0.3-0.7). CBC stable - no bleeding noted at this time. RN confirms rate is at 900 units/hr currently.   Goal of Therapy:  Heparin level 0.3-0.7 units/ml Monitor platelets by anticoagulation protocol: Yes   Plan:  1. Continue Heparin at 900 units/hr (9 ml/hr) 2. Will continue to monitor for any signs/symptoms of bleeding and will follow up with heparin level in the a.m.   Thank you for allowing pharmacy to be a part of this patient's care.  Georgina PillionElizabeth Vester Balthazor, PharmD, BCPS Clinical Pharmacist Pager: [phone] Clinical phone for 07/04/2017 from 7a-3:30p: [phone] If after 3:30p, please call main pharmacy at: [phone] 07/04/2017 10:45 AM

## 2017-07-04 NOTE — Progress Notes (Signed)
EEG Completed; Results Pending  

## 2017-07-04 NOTE — Progress Notes (Signed)
MEDICATION RELATED NOTE Pharmacy Re:  Home meds  Assessment: Patient admitted with altered mental status and unable to provide medication history.  Family states that she cares for herself and manages her own medications.    We have included on her med-list the most current dispensed medications for her but cannot verify if she is taking them as prescribed.  We cannot determine last doses taken either.   Plan:  Will mark her history as complete, should you obtain new information and need for pharmacy to update med list, please let us know.  Medications:  Prescriptions Prior to Admission  Medication Sig Dispense Refill Last Dose  . acetaminophen (TYLENOL) 500 MG tablet Take 500 mg by mouth daily as needed for pain.     Marland Kitchen. apixaban (ELIQUIS) 5 MG TABS tablet Take 1 tablet (5 mg total) by mouth 2 (two) times daily. 60 tablet 1   . atorvastatin (LIPITOR) 40 MG tablet Take 40 mg by mouth every morning.      . cholecalciferol (VITAMIN D) 1000 units tablet Take 1,000 Units by mouth daily.     Marland Kitchen. lisinopril (PRINIVIL,ZESTRIL) 20 MG tablet Take 1 tablet (20 mg total) by mouth daily. 30 tablet 0   . metFORMIN (GLUCOPHAGE-XR) 500 MG 24 hr tablet Take 1 tablet by mouth 2 (two) times daily.     . metoprolol tartrate 37.5 MG TABS Take 37.5 mg by mouth 2 (two) times daily. 60 tablet 0   . Multiple Vitamin (MULTIVITAMIN WITH MINERALS) TABS Take 1 tablet by mouth daily.     Marland Kitchen. omeprazole (PRILOSEC) 20 MG capsule Take 20 mg by mouth daily.     . vitamin B-12 (CYANOCOBALAMIN) 1000 MCG tablet Take 1,000 mcg by mouth daily.         Nadara MustardJohnston, Jayd Forrey MecklingFaye 07/04/2017,10:08 AM

## 2017-07-04 NOTE — Progress Notes (Signed)
Daily Progress Note   Patient Name: Deborah Benjamin       Date: 07/04/2017 DOB: September 12, 1941  Age: 76 y.o. MRN#: [id number] Attending Physician: Theodis Blaze, MD Primary Care Physician: Jani Gravel, MD Admit Date: 06/29/2017  Reason for Consultation/Follow-up: Establishing goals of care  Subjective: Ms. Sewell is more alert at times but inconsistent.   Length of Stay: 5  Current Medications: Scheduled Meds:  . chlorhexidine  15 mL Mouth Rinse BID  . Chlorhexidine Gluconate Cloth  6 each Topical Daily  . insulin aspart  0-9 Units Subcutaneous Q4H  . mouth rinse  15 mL Mouth Rinse q12n4p  . metoprolol tartrate  2.5 mg Intravenous Q6H  . nystatin   Topical TID  . potassium chloride  40 mEq Oral Once  . sodium chloride flush  10-40 mL Intracatheter Q12H  . vancomycin  500 mg Oral Q6H    Continuous Infusions: . dextrose 5 % with KCl 20 mEq / L 20 mEq (07/04/17 1803)  . diltiazem (CARDIZEM) infusion Stopped (07/04/17 0600)  . heparin 900 Units/hr (07/04/17 1746)  . metronidazole      PRN Meds: acetaminophen, acetaminophen, LORazepam, ondansetron (ZOFRAN) IV, sodium chloride flush  Physical Exam  Constitutional: She appears well-developed. She appears lethargic. She appears ill.  Obese   HENT:  Head: Normocephalic and atraumatic.  Cardiovascular: An irregularly irregular rhythm present. Tachycardia present.   Pulmonary/Chest: Effort normal. No accessory muscle usage. No tachypnea. No respiratory distress.  Abdominal: Soft. Normal appearance.  Neurological: She appears lethargic.  When she answers questions the answers are appropriate  Nursing note and vitals reviewed.           Vital Signs: BP 137/85 (BP Location: Left Wrist)   Pulse (!) 103   Temp 99.2 F (37.3 C)  (Axillary)   Resp (!) 22   Ht _0  (1.651 m)   Wt 92.3 kg (203 lb 7.8 oz)   SpO2 100%   BMI 33.86 kg/m  SpO2: SpO2: 100 % O2 Device: O2 Device: Nasal Cannula O2 Flow Rate: O2 Flow Rate (L/min): 3 L/min  Intake/output summary:  Intake/Output Summary (Last 24 hours) at 07/04/17 1855 Last data filed at 07/04/17 1543  Gross per 24 hour  Intake          1754.77 ml  Output  750 ml  Net          1004.77 ml   LBM: Last BM Date: 07/02/17 Baseline Weight: Weight: 99.8 kg (220 lb) Most recent weight: Weight: 92.3 kg (203 lb 7.8 oz)       Palliative Assessment/Data:    Flowsheet Rows     Most Recent Value  Intake Tab  Referral Department  Hospitalist  Unit at Time of Referral  ICU  Palliative Care Primary Diagnosis  Cardiac  Date Notified  07/01/17  Palliative Care Type  New Palliative care  Reason for referral  Clarify Goals of Care, Psychosocial or Spiritual support  Date of Admission  06/29/17  Date first seen by Palliative Care  07/01/17  # of days Palliative referral response time  0 Day(s)  # of days IP prior to Palliative referral  2  Clinical Assessment  Pain Max last 24 hours  Not able to report  Pain Min Last 24 hours  Not able to report  Dyspnea Max Last 24 Hours  Not able to report  Dyspnea Min Last 24 hours  Not able to report  Psychosocial & Spiritual Assessment  Palliative Care Outcomes  Patient/Family meeting held?  No [Patient unable to participate, left voicemail for sister]  Patient/Family wishes: Interventions discontinued/not started   Mechanical Ventilation      Patient Active Problem List   Diagnosis Date Noted  . Urinary tract infection without hematuria   . Palliative care encounter   . Goals of care, counseling/discussion   . Atrial fibrillation with RVR ([place]) 06/30/2017  . Acute renal failure superimposed on stage 3 chronic kidney disease ([place]) 06/30/2017  . Acute encephalopathy 06/29/2017  . Sepsis ([place]) 06/29/2017  . AKI  (acute kidney injury) ([place]) 06/29/2017  . Hyponatremia 05/30/2017  . Atrial fibrillation and flutter ([place]) 05/30/2017  . Essential hypertension 05/30/2017  . Vertigo 05/30/2017  . Diabetes mellitus type 2 in obese ([place]) 05/30/2017  . Hyperlipidemia 05/30/2017    Palliative Care Assessment & Plan   HPI: 76 y.o. female  with past medical history of diabetes without complications, high blood pressure and high cholesterol, atrial fibrillation, history of gallbladder surgery and abdominal hysterectomy admitted on 06/29/2017 with acute encephalopathy possibly related to sepsis. Treated for UTI but remains very lethargic with encephalopathy.    Assessment: I met today at "Boo" or "Lucy's" bedside - family says she hates to be called Tema. Sister, [name], and niece are at bedside. Patient will inconsistently respond to our questions. When she does respond she answers appropriately Hello - "hi" or How are you? "I'm fine." Family seem happy with even this small improvement.   We discussed more her baseline health prior to her admission 05/30/17-05/31/17. They said that she has always been obese and this has effected her mobility. They also said that a couple years ago she lost a lot of weight (not purposely) and has struggled with chronic diarrhea since. She mainly sits at home in her chair with her dog, Burt Knack. She naps during the day and will go to sleep at 4-5 pm and awaken 2-3 am. Main diet consists of cereal and soda but sister says she thinks her intake/appetite is not great as she fears she will have more diarrhea she sometimes avoids eating. Sister is concerned with her decline and mentions that they had a sister with "stomach cancer." Sister confirmed DNR. Hopeful for improvement but they seem aware of how ill she is currently.   Await further input from neurology. Follow  WBC/fever/infection. I will continue to follow and discuss with sister.   Recommendations/Plan:  Continue work up per neuro and  primary teams.   No pain/discomfort.  Avoid sedating medications.   Code Status:  DNR confirmed with sister (she says they filled out "paperwork" together but cannot currently find)  Prognosis:   Unable to determine  Discharge Planning:  To Be Determined  Thank you for allowing the Palliative Medicine Team to assist in the care of this patient.   Total Time 70mn Prolonged Time Billed  no       Greater than 50%  of this time was spent counseling and coordinating care related to the above assessment and plan.  AVinie Sill NP Palliative Medicine Team Pager # [phone](M-F 8a-5p) Team Phone # [phone](Nights/Weekends)

## 2017-07-04 NOTE — Care Management Note (Addendum)
Case Management Note  Patient Details  Name: Deborah Benjamin MRN: [id number] Date of Birth: May 15, 1941  Subjective/Objective:   Transferred from [facility]  From home  Alone, sister lives next door, she has new onset afib, on apixaban at home.  She is lethargic, has mittens on, flexiseal, 2 liters oxygen, heparin drip at 9 and ivf 's and cardizem drip .  Cardizem drip dc'd today.   7/10 Letha Capeeborah Casady Voshell RN, BSN - discussed in LOS, appropriate for continued stay.   7/12 1205 Letha Capeeborah Grove Defina RN, BSN - Acute encephalopathy , afib, c diff,lethargic, family does not want peg tube but ok with nasal tube feedings, conts with flexiseal, conts on amio, heparin drip, d5, lopressor iv q6, Family hopeful for improvement.  Discussed in LOS ,appropriate for continued stay.              Action/Plan: NCM will follow for dc needs.   Expected Discharge Date:                  Expected Discharge Plan:     In-House Referral:     Discharge planning Services  CM Consult  Post Acute Care Choice:    Choice offered to:     DME Arranged:    DME Agency:     HH Arranged:    HH Agency:     Status of Service:  In process, will continue to follow  If discussed at Long Length of Stay Meetings, dates discussed:  267/10, 711  Additional Comments:  Deborah Benjamin, Deborah Lanni Clinton, RN 07/04/2017, 5:49 PM

## 2017-07-04 NOTE — Progress Notes (Addendum)
PROGRESS NOTE  Deborah Benjamin ZOX:[id number] DOB: 08-Dec-1941 DOA: 06/29/2017 PCP: Pearson Grippe, MD   Brief History: 76 year old female with a history of hypertension, hyperlipidemia, diabetes mellitus, and recently diagnosed atrial fibrillation presented with altered mental status. According to the patient's sister, the patient was "not right"at the time of her last hospital discharge. The patient was admitted to the hospital from 05/30/2017 through 05/31/2017 at which time, the patient was found to have new onset atrial fibrillation. The patient was discharged home with metoprolol and apixiban. Since discharge from the hospital, the patient has had a waxing and waning episodes of confusion which has worsened in the 24 hours prior to coming back to the emergency department. According to the patient's sister, the patient was "awful". In the emergency department, the patient was noted to have a low-grade temperature 99.3 with intermittent episodes of hypotension. She was found have atrial fibrillation with RVR with heart rate 143. WBC was 14.8 with lactic acid 1.59. In addition, her serum creatinine was elevated beyond her normal baseline.   Since admission, the patient's renal function has improved with intravenous fluids. Workup for reversible causes of encephalopathy was unremarkable. MRI of the brain was negative for acute findings. The patient was treated with IV antibiotics for 5 days for presumptive UTI. Unfortunately, the patient continued to remain encephalopathic and very somnolent. After discussion with the patient's sister, she was agreeable to transfer the patient to [facility] for further neuro workup  Assessment/Plan: Acute metabolic encephalopathy - Multifactorial including atrial fibrillation, AKI, UTI and hypernatremia - very slow clincial improvement  - concerned about autoimmune encephalopathy - CT brain negative - UA 6-30 WBC--culture unrevealing - serum B12--4525 -  ammonia--11 - TSH 0.416 - ABG--7.44/29/123/22 on 3 L - MR brain--negative for acute findings - neurology has nothing else to offer at this time, attempt PT/OT if pt abel to tolerate   Tremor/Rhythmic movements - includes jaw and bilateral UE - doubt generalized seizure as pt responds during episodes - per neurology   Atrial Fibrillation with RVR, CHADS2 vasc 5 - has been on cardizem and metoprolol  - amiodarone x 1 given 7/6 - TSH 0.416 - 05/31/2017 EchoEF 60-65%, mild MR, trivial TR, PASP 30 - also on IV Heparin, cardiology team following   Acute on chronic renal failure--CKD stage 3 - Secondary to volume depletion - resolved with IVF - BMP in AM   Sepsis - initially thought to be secondary to urine source and possible aspiration pneumonitis - pt was treated with Zosyn, this was stopped as C. Diff is positive  - due to severity of illness, pt was started on oral vanc and IV Flagyl and once clinically improves, can be narrowed down to single agent   Pyuria/UTI - stopped IV Zosyn   Hypernatremia  - resolved with IVF  Essential hypertension  - reasonably stable so far - monitor closely   Lower extremity pain and edema  - venous dupplex negative   Diabetes mellitus type 2  - Holding metformin  - 05/30/2017 hemoglobin A1c 6.6  - continue SSI   Elevated troponin - due to demand ischemia - per cardiology   Disposition Plan: to be determined  Family Communication: no family at bedside, time spent total 60 minutes   Consultants: neurology  Code Status: DNR  DVT Prophylaxis: IVHeparin  Procedures: As Listed in Progress Note Above  Antibiotics: vanco 7/4>>>7/5 Zosyn 7/4>>>  Subjective: Says she is OK this AM.  Objective: Vitals:   07/04/17 0500 07/04/17 0729 07/04/17 1130 07/04/17 1542  BP: 120/63 100/70 137/85   Pulse: 91 88 (!) 103   Resp: (!) 24 (!) 29 (!) 22   Temp:  98 F (36.7 C) 98 F (36.7 C) 99.2 F (37.3 C)   TempSrc:  Axillary Oral Axillary  SpO2: 100% 99% 100%   Weight:      Height:        Intake/Output Summary (Last 24 hours) at 07/04/17 1822 Last data filed at 07/04/17 1543  Gross per 24 hour  Intake          2092.87 ml  Output              750 ml  Net          1342.87 ml   Weight change: -1.5 kg (-3 lb 4.9 oz)  Physical Exam  Constitutional: Appears lethargic, able to open eyes and follow some simple commands  CVS: IRRR, no gallops, no carotid bruit.  Pulmonary: Crackles at bases  Abdominal: Soft. BS +,  no distension, tenderness, rebound or guarding.  Musculoskeletal: Trace bilateral LE edema Lymphadenopathy: No lymphadenopathy noted, cervical, inguinal.  Data Reviewed: I have personally reviewed following labs and imaging studies Basic Metabolic Panel:  Recent Labs Lab 06/30/17 0359 07/01/17 0516 07/02/17 0450 07/03/17 0400 07/04/17 0211  NA 145 147* 147* 149* 143  K 3.3* 2.8* 3.4* 3.6 3.1*  CL 114* 116* 116* 117* 112*  CO2 22 20* 21* 23 23  GLUCOSE 150* 172* 160* 170* 162*  BUN 37* 27* 21* 17 12  CREATININE 1.05* 1.18* 0.97 0.91 0.82  CALCIUM 8.5* 8.5* 8.6* 8.7* 8.4*  MG  --  1.1* 1.8  --   --    Liver Function Tests:  Recent Labs Lab 06/29/17 1304 07/04/17 0211  AST 21 41  ALT 20 49  ALKPHOS 65 69  BILITOT 1.5* 0.4  PROT 7.2 4.9*  ALBUMIN 4.1 2.4*    Recent Labs Lab 06/30/17 0948  AMMONIA 11   Coagulation Profile:  Recent Labs Lab 06/29/17 2241  INR 1.07   CBC:  Recent Labs Lab 06/29/17 1304 06/30/17 0359 07/01/17 0516 07/02/17 0450 07/03/17 0400 07/04/17 0211  WBC 14.8* 14.1* 11.1* 12.6* 15.1* 15.2*  NEUTROABS 13.0*  --   --   --   --   --   HGB 12.0 11.2* 10.5* 10.7* 10.7* 10.6*  HCT 34.3* 32.3* 30.4* 31.3* 32.2* 32.4*  MCV 89.1 89.5 89.9 89.9 91.2 90.8  PLT 435* 357 351 351 346 314   Cardiac Enzymes:  Recent Labs Lab 06/29/17 1304 06/29/17 2241 06/30/17 0359 06/30/17 0948  TROPONINI 0.03* 0.06* 0.05* 0.04*    BNP: Invalid input(s): POCBNP CBG:  Recent Labs Lab 07/03/17 1958 07/04/17 0033 07/04/17 0727 07/04/17 1128 07/04/17 1539  GLUCAP 134* 175* 125* 155* 154*   Urine analysis:    Component Value Date/Time   COLORURINE AMBER (A) 06/29/2017 1342   APPEARANCEUR HAZY (A) 06/29/2017 1342   LABSPEC 1.019 06/29/2017 1342   PHURINE 5.0 06/29/2017 1342   GLUCOSEU NEGATIVE 06/29/2017 1342   HGBUR MODERATE (A) 06/29/2017 1342   BILIRUBINUR NEGATIVE 06/29/2017 1342   KETONESUR 5 (A) 06/29/2017 1342   PROTEINUR 30 (A) 06/29/2017 1342   UROBILINOGEN 0.2 03/06/2015 1213   NITRITE NEGATIVE 06/29/2017 1342   LEUKOCYTESUR MODERATE (A) 06/29/2017 1342   Recent Results (from the past 240 hour(s))  Blood Culture (routine x 2)     Status: None  Collection Time: 06/29/17  1:35 PM  Result Value Ref Range Status   Specimen Description BLOOD RIGHT WRIST  Final   Special Requests   Final    BOTTLES DRAWN AEROBIC AND ANAEROBIC Blood Culture results may not be optimal due to an inadequate volume of blood received in culture bottles   Culture NO GROWTH 5 DAYS  Final   Report Status 07/04/2017 FINAL  Final  Urine culture     Status: Abnormal   Collection Time: 06/29/17  1:42 PM  Result Value Ref Range Status   Specimen Description URINE, CLEAN CATCH  Final   Special Requests NONE  Final   Culture MULTIPLE SPECIES PRESENT, SUGGEST RECOLLECTION (A)  Final   Report Status 07/01/2017 FINAL  Final  Culture, blood (Routine X 2) w Reflex to ID Panel     Status: None   Collection Time: 06/29/17  3:55 PM  Result Value Ref Range Status   Specimen Description BLOOD LEFT HAND  Final   Special Requests   Final    Blood Culture results may not be optimal due to an inadequate volume of blood received in culture bottles   Culture NO GROWTH 5 DAYS  Final   Report Status 07/04/2017 FINAL  Final  MRSA PCR Screening     Status: None   Collection Time: 06/29/17 10:05 PM  Result Value Ref Range Status   MRSA by  PCR NEGATIVE NEGATIVE Final    Comment:        The GeneXpert MRSA Assay (FDA approved for NASAL specimens only), is one component of a comprehensive MRSA colonization surveillance program. It is not intended to diagnose MRSA infection nor to guide or monitor treatment for MRSA infections.   MRSA PCR Screening     Status: None   Collection Time: 07/03/17  2:29 PM  Result Value Ref Range Status   MRSA by PCR NEGATIVE NEGATIVE Final    Comment:        The GeneXpert MRSA Assay (FDA approved for NASAL specimens only), is one component of a comprehensive MRSA colonization surveillance program. It is not intended to diagnose MRSA infection nor to guide or monitor treatment for MRSA infections.   C difficile quick scan w PCR reflex     Status: Abnormal   Collection Time: 07/03/17  5:27 PM  Result Value Ref Range Status   C Diff antigen POSITIVE (A) NEGATIVE Final   C Diff toxin NEGATIVE NEGATIVE Final   C Diff interpretation Results are indeterminate. See PCR results.  Final  Clostridium Difficile by PCR     Status: Abnormal   Collection Time: 07/03/17  5:27 PM  Result Value Ref Range Status   Toxigenic C Difficile by pcr POSITIVE (A) NEGATIVE Final    Comment: Positive for toxigenic C. difficile with little to no toxin production. Only treat if clinical presentation suggests symptomatic illness.     Scheduled Meds: . chlorhexidine  15 mL Mouth Rinse BID  . Chlorhexidine Gluconate Cloth  6 each Topical Daily  . insulin aspart  0-9 Units Subcutaneous Q4H  . mouth rinse  15 mL Mouth Rinse q12n4p  . metoprolol tartrate  2.5 mg Intravenous Q6H  . nystatin   Topical TID  . sodium chloride flush  10-40 mL Intracatheter Q12H   Continuous Infusions: . dextrose 5 % with KCl 20 mEq / L 20 mEq (07/04/17 1803)  . diltiazem (CARDIZEM) infusion Stopped (07/04/17 0600)  . heparin 900 Units/hr (07/04/17 1746)    Procedures/Studies: Ct  Head Wo Contrast  Result Date:  06/29/2017 CLINICAL DATA:  Confusion for 1 week. EXAM: CT HEAD WITHOUT CONTRAST TECHNIQUE: Contiguous axial images were obtained from the base of the skull through the vertex without intravenous contrast. COMPARISON:  Head CT scan 05/30/2017 in 03/06/2015. FINDINGS: Brain: There is some chronic microvascular ischemic change. No evidence of acute intracranial abnormality including hemorrhage, infarct, mass lesion, mass effect, midline shift or abnormal extra-axial fluid collection. No hydrocephalus or pneumocephalus. Vascular: Atherosclerosis noted. Skull: Intact. Sinuses/Orbits: Negative. Other: None. IMPRESSION: No acute abnormality. Atherosclerosis. Mild appearing chronic microvascular ischemic change. Electronically Signed   By: Drusilla Kannerhomas  Dalessio M.D.   On: 06/29/2017 16:25   Mr Brain Wo Contrast  Result Date: 07/01/2017 CLINICAL DATA:  Acute encephalopathy. Concern for acute stroke in the setting of atrial fibrillation. EXAM: MRI HEAD WITHOUT CONTRAST TECHNIQUE: Multiplanar, multiecho pulse sequences of the brain and surrounding structures were obtained without intravenous contrast. COMPARISON:  Head CT from 2 days ago.  Brain MRI 12/05/2009 FINDINGS: Brain: No acute infarction, hemorrhage, hydrocephalus, extra-axial collection or mass lesion. Cerebral white matter disease attributed to chronic small vessel ischemia in this patient with multiple vascular risk factors. There has also been progressive subcortical white matter involvement. Chronic cystic intensity spaces lateral to the atrium of the right lateral ventricle, nonspecific. Brain volume remains normal for age. No evidence of chronic blood products. Vascular: Major flow voids are preserved. Skull and upper cervical spine: No evidence of marrow lesion Sinuses/Orbits: Negative Other: Intermittently motion degraded. IMPRESSION: 1. No acute or reversible finding. 2. 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  Electronically Signed   By: Marnee SpringJonathon  Watts M.D.   On: 07/01/2017 13:25   [place] Venous Img Lower Bilateral  Result Date: 06/30/2017 CLINICAL DATA:  Bilateral lower extremity pain and edema EXAM: BILATERAL LOWER EXTREMITY VENOUS DOPPLER ULTRASOUND TECHNIQUE: Gray-scale sonography with graded compression, as well as color Doppler and duplex ultrasound were performed to evaluate the lower extremity deep venous systems from the level of the common femoral vein and including the common femoral, femoral, profunda femoral, popliteal and calf veins including the posterior tibial, peroneal and gastrocnemius veins when visible. The superficial great saphenous vein was also interrogated. Spectral Doppler was utilized to evaluate flow at rest and with distal augmentation maneuvers in the common femoral, femoral and popliteal veins. COMPARISON:  None. FINDINGS: RIGHT LOWER EXTREMITY Common Femoral Vein: No evidence of thrombus. Normal compressibility, respiratory phasicity and response to augmentation. Saphenofemoral Junction: No evidence of thrombus. Normal compressibility and flow on color Doppler imaging. Profunda Femoral Vein: No evidence of thrombus. Normal compressibility and flow on color Doppler imaging. Femoral Vein: No evidence of thrombus. Normal compressibility, respiratory phasicity and response to augmentation. Popliteal Vein: No evidence of thrombus. Normal compressibility, respiratory phasicity and response to augmentation. Calf Veins: No evidence of thrombus. Normal compressibility and flow on color Doppler imaging. Superficial Great Saphenous Vein: No evidence of thrombus. Normal compressibility and flow on color Doppler imaging. Marland Kitchen. LEFT LOWER EXTREMITY Common Femoral Vein: No evidence of thrombus. Normal compressibility, respiratory phasicity and response to augmentation. Saphenofemoral Junction: No evidence of thrombus. Normal compressibility and flow on color Doppler imaging. Profunda Femoral Vein: No  evidence of thrombus. Normal compressibility and flow on color Doppler imaging. Femoral Vein: No evidence of thrombus. Normal compressibility, respiratory phasicity and response to augmentation. Popliteal Vein: No evidence of thrombus. Normal compressibility, respiratory phasicity and response to augmentation. Calf Veins: No evidence of thrombus. Normal compressibility  and flow on color Doppler imaging. Superficial Great Saphenous Vein: No evidence of thrombus. Normal compressibility and flow on color Doppler imaging. IMPRESSION: No evidence of DVT within either lower extremity. Electronically Signed   By: Genevive Bi M.D.   On: 06/30/2017 15:22   Dg Chest Port 1 View  Result Date: 06/30/2017 CLINICAL DATA:  Hypoxia EXAM: PORTABLE CHEST 1 VIEW COMPARISON:  June 29, 2017 FINDINGS: There is no edema or consolidation. The heart size and pulmonary vascularity are normal. No adenopathy. There is aortic atherosclerosis. No evident bone lesions. IMPRESSION: Aortic atherosclerosis.  No edema or consolidation. Aortic Atherosclerosis (ICD10-I70.0). Electronically Signed   By: Bretta Bang III M.D.   On: 06/30/2017 10:02   Dg Chest Port 1 View  Result Date: 06/29/2017 CLINICAL DATA:  Altered mental status. EXAM: PORTABLE CHEST 1 VIEW COMPARISON:  Abdominal series on 05/30/2017 FINDINGS: The heart size and mediastinal contours are within normal limits. Lung volumes are low bilaterally. There is no evidence of pulmonary edema, consolidation, pneumothorax, nodule or pleural fluid. The visualized skeletal structures are unremarkable. IMPRESSION: No active disease. Electronically Signed   By: Irish Lack M.D.   On: 06/29/2017 13:25   Dg Chest Port 1v Same Day  Result Date: 06/30/2017 CLINICAL DATA:  PICC line insertion. EXAM: PORTABLE CHEST 1 VIEW COMPARISON:  06/30/2017 . FINDINGS: PICC line noted with its tip projected over the SVC. Heart size normal. Low lung volumes with basilar atelectasis. No pleural  effusion pneumothorax. IMPRESSION: PICC line noted with its tip projected over the SVC. Electronically Signed   By: Maisie Fus  Register   On: 06/30/2017 11:55    Debbora Presto, MD  [facility] Pager [phone]  If 7PM-7AM, please contact night-coverage [email or web address] Password [email or web address] 07/04/2017, 6:22 PM   LOS: 5 days

## 2017-07-05 ENCOUNTER — Encounter (HOSPITAL_COMMUNITY): Payer: Self-pay | Admitting: Cardiology

## 2017-07-05 LAB — CBC
HEMATOCRIT: 32.1 % — AB (ref 36.0–46.0)
Hemoglobin: 10.5 g/dL — ABNORMAL LOW (ref 12.0–15.0)
MCH: 29.7 pg (ref 26.0–34.0)
MCHC: 32.7 g/dL (ref 30.0–36.0)
MCV: 90.7 fL (ref 78.0–100.0)
Platelets: 307 10*3/uL (ref 150–400)
RBC: 3.54 MIL/uL — ABNORMAL LOW (ref 3.87–5.11)
RDW: 13.5 % (ref 11.5–15.5)
WBC: 13.6 10*3/uL — AB (ref 4.0–10.5)

## 2017-07-05 LAB — GLUCOSE, CAPILLARY
GLUCOSE-CAPILLARY: 147 mg/dL — AB (ref 65–99)
GLUCOSE-CAPILLARY: 149 mg/dL — AB (ref 65–99)
GLUCOSE-CAPILLARY: 163 mg/dL — AB (ref 65–99)
GLUCOSE-CAPILLARY: 163 mg/dL — AB (ref 65–99)
GLUCOSE-CAPILLARY: 171 mg/dL — AB (ref 65–99)
Glucose-Capillary: 146 mg/dL — ABNORMAL HIGH (ref 65–99)

## 2017-07-05 LAB — HEPARIN LEVEL (UNFRACTIONATED): HEPARIN UNFRACTIONATED: 0.32 [IU]/mL (ref 0.30–0.70)

## 2017-07-05 MED ORDER — AMIODARONE HCL IN DEXTROSE 360-4.14 MG/200ML-% IV SOLN
30.0000 mg/h | INTRAVENOUS | Status: DC
  Administered 2017-07-06 – 2017-07-08 (×5): 30 mg/h via INTRAVENOUS
  Filled 2017-07-05 (×5): qty 200

## 2017-07-05 MED ORDER — AMIODARONE HCL IN DEXTROSE 360-4.14 MG/200ML-% IV SOLN
60.0000 mg/h | INTRAVENOUS | Status: AC
  Administered 2017-07-05 (×2): 60 mg/h via INTRAVENOUS
  Filled 2017-07-05 (×2): qty 200

## 2017-07-05 MED ORDER — AMIODARONE LOAD VIA INFUSION
150.0000 mg | Freq: Once | INTRAVENOUS | Status: AC
  Administered 2017-07-05: 150 mg via INTRAVENOUS
  Filled 2017-07-05: qty 83.34

## 2017-07-05 MED ORDER — METOPROLOL TARTRATE 5 MG/5ML IV SOLN
5.0000 mg | Freq: Four times a day (QID) | INTRAVENOUS | Status: DC
  Administered 2017-07-05 – 2017-07-06 (×2): 5 mg via INTRAVENOUS
  Filled 2017-07-05 (×2): qty 5

## 2017-07-05 MED ORDER — DILTIAZEM HCL 100 MG IV SOLR: 5.0000 mg/h | INTRAVENOUS | Status: DC

## 2017-07-05 MED ORDER — POTASSIUM CL IN DEXTROSE 5% 20 MEQ/L IV SOLN
20.0000 meq | INTRAVENOUS | Status: DC
Start: 2017-07-05 — End: 2017-07-06
  Filled 2017-07-05: qty 1000

## 2017-07-05 NOTE — Progress Notes (Addendum)
PROGRESS NOTE  Deborah Benjamin ZOX:[id number] DOB: Oct 31, 1941 DOA: 06/29/2017   PCP: Pearson Grippe, MD   Brief History: 76 year old female with a history of hypertension, hyperlipidemia, diabetes mellitus, and recently diagnosed atrial fibrillation presented with altered mental status. According to the patient's sister, the patient was "not right"at the time of her last hospital discharge. The patient was admitted to the hospital from 05/30/2017 through 05/31/2017 at which time, the patient was found to have new onset atrial fibrillation. The patient was discharged home with metoprolol and apixiban. Since discharge from the hospital, the patient has had a waxing and waning episodes of confusion which has worsened in the 24 hours prior to coming back to the emergency department. According to the patient's sister, the patient was "awful". In the emergency department, the patient was noted to have a low-grade temperature 99.3 with intermittent episodes of hypotension. She was found have atrial fibrillation with RVR with heart rate 143. WBC was 14.8 with lactic acid 1.59. In addition, her serum creatinine was elevated beyond her normal baseline.   Since admission, the patient's renal function has improved with intravenous fluids. Workup for reversible causes of encephalopathy was unremarkable. MRI of the brain was negative for acute findings. The patient was treated with IV antibiotics for 5 days for presumptive UTI. Unfortunately, the patient continued to remain encephalopathic and very somnolent. After discussion with the patient's sister, she was agreeable to transfer the patient to [facility] for further neuro workup  Assessment/Plan: Acute metabolic encephalopathy - Multifactorial including atrial fibrillation, AKI, UTI and hypernatremia, C. Diff  - still confused and slow to improve  - CT and MRI brain negative - pt has been treated with Zosyn for UTI, thearpy completed and pt now developed C.  Diff - ABX oral vanc and flagyl started on 7/9 and today is day #2 - narrow down to single agent once pt clinically improving - attempt PT/OT once pt able to participate  - Vit B12 and ammonia level WNL - neurology team has nothing else to offer   Tremor/Rhythmic movements - includes jaw and bilateral UE - improving   Atrial Fibrillation with RVR, CHADS2 vasc 5 - has been on cardizem and metoprolol  - amiodarone x 1 given 7/6 - TSH 0.416 - 05/31/2017 EchoEF 60-65%, mild MR, trivial TR, PASP 30 - on heparin drip per cardiology   Acute on chronic renal failure--CKD stage 3 - Secondary to volume depletion - resolved with IVF   Sepsis - initially thought to be secondary to urine source and possible aspiration pneumonitis - pt was treated with Zosyn, this was stopped 7/9 ans pt developed diarrhea and C. Diff positive  - due to severity of illness, pt was started on oral vanc and IV Flagyl and once clinically improves, can be narrowed down to single agent  - WBC is trending down - will repeat CBC in AM - transfer to SDU   Pyuria/UTI - stopped IV Zosyn 7/9  Hypernatremia  - resolved with IVF   Essential hypertension  - reasonably stable so far, can move out to SDU   Lower extremity pain and edema  - venous dupplex negative   Diabetes mellitus type 2  - Holding metformin  - 05/30/2017 hemoglobin A1c 6.6  - continue SSI   Elevated troponin - due to demand ischemia - per cardiology   Disposition Plan: to be determined but stable to transfer to SDU Family Communication: no family at bedside,  time spent total 60 minutes, attempted to call sister whose phone number is listed in EPIC but the line is busy and I was not able to leave message   Consultants: neurology, PCT, cardiology  Code Status: DNR  DVT Prophylaxis: IVHeparin  Procedures: As Listed in Progress Note Above  Antibiotics: vanco 7/4 >>> 7/5 Zosyn 7/4 >>> 7/9 Flagyl and oral vanc  7/9 >>>  Subjective: Says she is feeling bit better.   Objective: Vitals:   07/05/17 0800 07/05/17 0900 07/05/17 1000 07/05/17 1202  BP: (!) 147/84 (!) 158/104 120/60 116/70  Pulse: (!) 102 (!) 112 (!) 108 (!) 111  Resp: 19 18 18 19   Temp:    97.7 F (36.5 C)  TempSrc:    Oral  SpO2: 100% 100% 100% 100%  Weight:      Height:        Intake/Output Summary (Last 24 hours) at 07/05/17 1452 Last data filed at 07/05/17 0800  Gross per 24 hour  Intake             1226 ml  Output              975 ml  Net              251 ml   Weight change:   Physical Exam  Constitutional: Appears somnolent but easy to awake, NAD  CVS: IRRR, no gallops, no carotid bruit.  Pulmonary: Effort and breath sounds normal, no stridor, rhonchi, wheezes, rales.  Abdominal: Soft. BS +,  no distension, tenderness, rebound or guarding.  Musculoskeletal: Normal range of motion. Trace bilateral lower extremity edema  Neuro: Somnolent but Normal reflexes, muscle tone coordination. No cranial nerve deficit.  Data Reviewed: I have personally reviewed following labs and imaging studies   Basic Metabolic Panel:  Recent Labs Lab 06/30/17 0359 07/01/17 0516 07/02/17 0450 07/03/17 0400 07/04/17 0211  NA 145 147* 147* 149* 143  K 3.3* 2.8* 3.4* 3.6 3.1*  CL 114* 116* 116* 117* 112*  CO2 22 20* 21* 23 23  GLUCOSE 150* 172* 160* 170* 162*  BUN 37* 27* 21* 17 12  CREATININE 1.05* 1.18* 0.97 0.91 0.82  CALCIUM 8.5* 8.5* 8.6* 8.7* 8.4*  MG  --  1.1* 1.8  --   --    Liver Function Tests:  Recent Labs Lab 06/29/17 1304 07/04/17 0211  AST 21 41  ALT 20 49  ALKPHOS 65 69  BILITOT 1.5* 0.4  PROT 7.2 4.9*  ALBUMIN 4.1 2.4*    Recent Labs Lab 06/30/17 0948  AMMONIA 11   Coagulation Profile:  Recent Labs Lab 06/29/17 2241  INR 1.07   CBC:  Recent Labs Lab 06/29/17 1304  07/01/17 0516 07/02/17 0450 07/03/17 0400 07/04/17 0211 07/05/17 0540  WBC 14.8*  < > 11.1* 12.6* 15.1* 15.2* 13.6*    NEUTROABS 13.0*  --   --   --   --   --   --   HGB 12.0  < > 10.5* 10.7* 10.7* 10.6* 10.5*  HCT 34.3*  < > 30.4* 31.3* 32.2* 32.4* 32.1*  MCV 89.1  < > 89.9 89.9 91.2 90.8 90.7  PLT 435*  < > 351 351 346 314 307  < > = values in this interval not displayed. Cardiac Enzymes:  Recent Labs Lab 06/29/17 1304 06/29/17 2241 06/30/17 0359 06/30/17 0948  TROPONINI 0.03* 0.06* 0.05* 0.04*   BNP: Invalid input(s): POCBNP CBG:  Recent Labs Lab 07/04/17 1920 07/04/17 2356 07/05/17 0321 07/05/17 0720  07/05/17 1200  GLUCAP 158* 146* 147* 146* 171*   Urine analysis:    Component Value Date/Time   COLORURINE AMBER (A) 06/29/2017 1342   APPEARANCEUR HAZY (A) 06/29/2017 1342   LABSPEC 1.019 06/29/2017 1342   PHURINE 5.0 06/29/2017 1342   GLUCOSEU NEGATIVE 06/29/2017 1342   HGBUR MODERATE (A) 06/29/2017 1342   BILIRUBINUR NEGATIVE 06/29/2017 1342   KETONESUR 5 (A) 06/29/2017 1342   PROTEINUR 30 (A) 06/29/2017 1342   UROBILINOGEN 0.2 03/06/2015 1213   NITRITE NEGATIVE 06/29/2017 1342   LEUKOCYTESUR MODERATE (A) 06/29/2017 1342   Recent Results (from the past 240 hour(s))  Blood Culture (routine x 2)     Status: None   Collection Time: 06/29/17  1:35 PM  Result Value Ref Range Status   Specimen Description BLOOD RIGHT WRIST  Final   Special Requests   Final    BOTTLES DRAWN AEROBIC AND ANAEROBIC Blood Culture results may not be optimal due to an inadequate volume of blood received in culture bottles   Culture NO GROWTH 5 DAYS  Final   Report Status 07/04/2017 FINAL  Final  Urine culture     Status: Abnormal   Collection Time: 06/29/17  1:42 PM  Result Value Ref Range Status   Specimen Description URINE, CLEAN CATCH  Final   Special Requests NONE  Final   Culture MULTIPLE SPECIES PRESENT, SUGGEST RECOLLECTION (A)  Final   Report Status 07/01/2017 FINAL  Final  Culture, blood (Routine X 2) w Reflex to ID Panel     Status: None   Collection Time: 06/29/17  3:55 PM  Result  Value Ref Range Status   Specimen Description BLOOD LEFT HAND  Final   Special Requests   Final    Blood Culture results may not be optimal due to an inadequate volume of blood received in culture bottles   Culture NO GROWTH 5 DAYS  Final   Report Status 07/04/2017 FINAL  Final  MRSA PCR Screening     Status: None   Collection Time: 06/29/17 10:05 PM  Result Value Ref Range Status   MRSA by PCR NEGATIVE NEGATIVE Final    Comment:        The GeneXpert MRSA Assay (FDA approved for NASAL specimens only), is one component of a comprehensive MRSA colonization surveillance program. It is not intended to diagnose MRSA infection nor to guide or monitor treatment for MRSA infections.   MRSA PCR Screening     Status: None   Collection Time: 07/03/17  2:29 PM  Result Value Ref Range Status   MRSA by PCR NEGATIVE NEGATIVE Final    Comment:        The GeneXpert MRSA Assay (FDA approved for NASAL specimens only), is one component of a comprehensive MRSA colonization surveillance program. It is not intended to diagnose MRSA infection nor to guide or monitor treatment for MRSA infections.   C difficile quick scan w PCR reflex     Status: Abnormal   Collection Time: 07/03/17  5:27 PM  Result Value Ref Range Status   C Diff antigen POSITIVE (A) NEGATIVE Final   C Diff toxin NEGATIVE NEGATIVE Final   C Diff interpretation Results are indeterminate. See PCR results.  Final  Clostridium Difficile by PCR     Status: Abnormal   Collection Time: 07/03/17  5:27 PM  Result Value Ref Range Status   Toxigenic C Difficile by pcr POSITIVE (A) NEGATIVE Final    Comment: Positive for toxigenic C. difficile with  little to no toxin production. Only treat if clinical presentation suggests symptomatic illness.     Scheduled Meds: . chlorhexidine  15 mL Mouth Rinse BID  . Chlorhexidine Gluconate Cloth  6 each Topical Daily  . insulin aspart  0-9 Units Subcutaneous Q4H  . mouth rinse  15 mL Mouth  Rinse q12n4p  . metoprolol tartrate  2.5 mg Intravenous Q6H  . nystatin   Topical TID  . sodium chloride flush  10-40 mL Intracatheter Q12H  . vancomycin  500 mg Oral Q6H   Continuous Infusions: . dextrose 5 % with KCl 20 mEq / L 20 mEq (07/04/17 1803)  . diltiazem (CARDIZEM) infusion Stopped (07/04/17 0600)  . heparin 900 Units/hr (07/04/17 1746)  . metronidazole Stopped (07/05/17 1101)    Procedures/Studies: Ct Head Wo Contrast  Result Date: 06/29/2017 CLINICAL DATA:  Confusion for 1 week. EXAM: CT HEAD WITHOUT CONTRAST TECHNIQUE: Contiguous axial images were obtained from the base of the skull through the vertex without intravenous contrast. COMPARISON:  Head CT scan 05/30/2017 in 03/06/2015. FINDINGS: Brain: There is some chronic microvascular ischemic change. No evidence of acute intracranial abnormality including hemorrhage, infarct, mass lesion, mass effect, midline shift or abnormal extra-axial fluid collection. No hydrocephalus or pneumocephalus. Vascular: Atherosclerosis noted. Skull: Intact. Sinuses/Orbits: Negative. Other: None. IMPRESSION: No acute abnormality. Atherosclerosis. Mild appearing chronic microvascular ischemic change. Electronically Signed   By: Drusilla Kanner M.D.   On: 06/29/2017 16:25   Mr Brain Wo Contrast  Result Date: 07/01/2017 CLINICAL DATA:  Acute encephalopathy. Concern for acute stroke in the setting of atrial fibrillation. EXAM: MRI HEAD WITHOUT CONTRAST TECHNIQUE: Multiplanar, multiecho pulse sequences of the brain and surrounding structures were obtained without intravenous contrast. COMPARISON:  Head CT from 2 days ago.  Brain MRI 12/05/2009 FINDINGS: Brain: No acute infarction, hemorrhage, hydrocephalus, extra-axial collection or mass lesion. Cerebral white matter disease attributed to chronic small vessel ischemia in this patient with multiple vascular risk factors. There has also been progressive subcortical white matter involvement. Chronic cystic  intensity spaces lateral to the atrium of the right lateral ventricle, nonspecific. Brain volume remains normal for age. No evidence of chronic blood products. Vascular: Major flow voids are preserved. Skull and upper cervical spine: No evidence of marrow lesion Sinuses/Orbits: Negative Other: Intermittently motion degraded. IMPRESSION: 1. No acute or reversible finding. 2. 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 Electronically Signed   By: Marnee Spring M.D.   On: 07/01/2017 13:25   US Venous Img Lower Bilateral  Result Date: 06/30/2017 CLINICAL DATA:  Bilateral lower extremity pain and edema EXAM: BILATERAL LOWER EXTREMITY VENOUS DOPPLER ULTRASOUND TECHNIQUE: Gray-scale sonography with graded compression, as well as color Doppler and duplex ultrasound were performed to evaluate the lower extremity deep venous systems from the level of the common femoral vein and including the common femoral, femoral, profunda femoral, popliteal and calf veins including the posterior tibial, peroneal and gastrocnemius veins when visible. The superficial great saphenous vein was also interrogated. Spectral Doppler was utilized to evaluate flow at rest and with distal augmentation maneuvers in the common femoral, femoral and popliteal veins. COMPARISON:  None. FINDINGS: RIGHT LOWER EXTREMITY Common Femoral Vein: No evidence of thrombus. Normal compressibility, respiratory phasicity and response to augmentation. Saphenofemoral Junction: No evidence of thrombus. Normal compressibility and flow on color Doppler imaging. Profunda Femoral Vein: No evidence of thrombus. Normal compressibility and flow on color Doppler imaging. Femoral Vein: No evidence of thrombus. Normal compressibility, respiratory  phasicity and response to augmentation. Popliteal Vein: No evidence of thrombus. Normal compressibility, respiratory phasicity and response to augmentation. Calf Veins: No evidence of  thrombus. Normal compressibility and flow on color Doppler imaging. Superficial Great Saphenous Vein: No evidence of thrombus. Normal compressibility and flow on color Doppler imaging. Marland Kitchen LEFT LOWER EXTREMITY Common Femoral Vein: No evidence of thrombus. Normal compressibility, respiratory phasicity and response to augmentation. Saphenofemoral Junction: No evidence of thrombus. Normal compressibility and flow on color Doppler imaging. Profunda Femoral Vein: No evidence of thrombus. Normal compressibility and flow on color Doppler imaging. Femoral Vein: No evidence of thrombus. Normal compressibility, respiratory phasicity and response to augmentation. Popliteal Vein: No evidence of thrombus. Normal compressibility, respiratory phasicity and response to augmentation. Calf Veins: No evidence of thrombus. Normal compressibility and flow on color Doppler imaging. Superficial Great Saphenous Vein: No evidence of thrombus. Normal compressibility and flow on color Doppler imaging. IMPRESSION: No evidence of DVT within either lower extremity. Electronically Signed   By: Genevive Bi M.D.   On: 06/30/2017 15:22   Dg Chest Port 1 View  Result Date: 06/30/2017 CLINICAL DATA:  Hypoxia EXAM: PORTABLE CHEST 1 VIEW COMPARISON:  June 29, 2017 FINDINGS: There is no edema or consolidation. The heart size and pulmonary vascularity are normal. No adenopathy. There is aortic atherosclerosis. No evident bone lesions. IMPRESSION: Aortic atherosclerosis.  No edema or consolidation. Aortic Atherosclerosis (ICD10-I70.0). Electronically Signed   By: Bretta Bang III M.D.   On: 06/30/2017 10:02   Dg Chest Port 1 View  Result Date: 06/29/2017 CLINICAL DATA:  Altered mental status. EXAM: PORTABLE CHEST 1 VIEW COMPARISON:  Abdominal series on 05/30/2017 FINDINGS: The heart size and mediastinal contours are within normal limits. Lung volumes are low bilaterally. There is no evidence of pulmonary edema, consolidation, pneumothorax,  nodule or pleural fluid. The visualized skeletal structures are unremarkable. IMPRESSION: No active disease. Electronically Signed   By: Irish Lack M.D.   On: 06/29/2017 13:25   Dg Chest Port 1v Same Day  Result Date: 06/30/2017 CLINICAL DATA:  PICC line insertion. EXAM: PORTABLE CHEST 1 VIEW COMPARISON:  06/30/2017 . FINDINGS: PICC line noted with its tip projected over the SVC. Heart size normal. Low lung volumes with basilar atelectasis. No pleural effusion pneumothorax. IMPRESSION: PICC line noted with its tip projected over the SVC. Electronically Signed   By: Maisie Fus  Register   On: 06/30/2017 11:55    Debbora Presto, MD  [facility] Pager [phone]  If 7PM-7AM, please contact night-coverage [email or web address] Password [email or web address] 07/05/2017, 2:52 PM   LOS: 6 days

## 2017-07-05 NOTE — Progress Notes (Signed)
ANTICOAGULATION CONSULT NOTE - Follow up  Pharmacy Consult for Heparin Indication: atrial fibrillation  No Known Allergies  Patient Measurements: Height: 5\' 5"  (165.1 cm) Weight: 203 lb 7.8 oz (92.3 kg) IBW/kg (Calculated) : 57  Vital Signs: Temp: 97.7 F (36.5 C) (07/10 0700) Temp Source: Oral (07/10 0700) BP: 158/104 (07/10 0900) Pulse Rate: 112 (07/10 0900)  Labs:  Recent Labs  07/03/17 0400  07/03/17 1751 07/04/17 0211 07/05/17 0540 07/05/17 0607  HGB 10.7*  --   --  10.6* 10.5*  --   HCT 32.2*  --   --  32.4* 32.1*  --   PLT 346  --   --  314 307  --   HEPARINUNFRC 0.17*  < > 0.41 0.40  --  0.32  CREATININE 0.91  --   --  0.82  --   --   < > = values in this interval not displayed. Estimated Creatinine Clearance: 67.6 mL/min (by C-G formula based on SCr of 0.82 mg/dL).  Assessment:  [age] YOF with recent diagnosis of Afib earlier in June and was on apixaban PTA however with questionable compliance admitted on with AMS and transitioned to heparin for anticoagulation until po intake more reliable.   Heparin level this morning remains therapeutic (HL 0.32, goal of 0.3-0.7). CBC stable - no bleeding noted at this time.  Goal of Therapy:  Heparin level 0.3-0.7 units/ml Monitor platelets by anticoagulation protocol: Yes   Plan:  1. Continue Heparin at 900 units/hr (9 ml/hr) 2. Will continue to monitor for any signs/symptoms of bleeding and will follow up with heparin level in the a.m.   Thank you for allowing pharmacy to be a part of this patient's care.  Georgina PillionElizabeth Aleyza Salmi, PharmD, BCPS Clinical Pharmacist Clinical phone for 07/05/2017 from 7a-3:30p: [phone] If after 3:30p, please call main pharmacy at: [phone] 07/05/2017 9:58 AM

## 2017-07-05 NOTE — Consult Note (Signed)
Cardiology Consultation:   Patient ID: ELLER SWEIS; [id number]; 1941/11/05   Admit date: 06/29/2017 Date of Consult: 07/05/2017  Primary Care Provider: Pearson Grippe, MD Primary Cardiologist: Dr. Darl Householder Primary Electrophysiologist:  NA   Patient Profile:   Deborah Benjamin is a 76 y.o. female with a hx of atrial fib,, with hospitalization 05/30/17 through 05/31/2017 and hx of HTN, HLD, DM, hypomagnesium, and seen 06/30/17 with hospitalization for a fib RVR and urosepsis, and has been transferred to [facility] for acute metabolic encephalopathy who is being seen today for the evaluation of atrial fib at the request of Dr. Izola Price.  History of Present Illness:   [name] was admitted 05/30/17 with dizziness and found to be in a fib. Her HR was controlled. Her atenolol was changed to metoprolol.    CHADSVASc score is is at least 4 and she was placed on Eliquis.  Was to see CARDs as outpt.  Pt readmitted 06/29/17 with AMS.  Was in a fib with RVR and consulted Dr. Darl Householder with cardiology.   Also with urosepsis.   IV fluids were given and she was placed on IV dilt.  She was placed on IV heparin and eliquis held.  Free t4 was elevated.  Troponin very mildly elevated at 0.05 to 0.06- demand ischemia.  Mg 1.1.  HR remained elevated - on the 6th given IV amiodarone bolus of 150 mg.  Echo 05/31/17 with EF 60-65%, no WMA, unable to eval diastolic function.  Mild MR, LA mildly to moderately dilated.   Trivial TR. PASP est. At 30 mmHg.   Pt transferred to [facility] on the 8th due to no improvement in neuro status on ABX and treatment of urosepsis.  MRI of brain without acute abnormality.  Slow clinical improvement .  Attempting PT/OT.  Now C. Diff Positive.  Now on vanc and IV flagyl.  Neuro has signed off.   Today  BP is labile from 80/52 to 156/100.  A fib with HR from 92 to 150.     K+ is 3.1 and has been replaced.  H/H 10.5/32/1  TSH 0.416  Free T4 at 1.74 slightly elevated  Pt still with rapid a fib at times.  On  IV lopressor alone.  2.5 mg every 6 hours.      Past Medical History:  Diagnosis Date  . Atrial fibrillation (HCC)   . Diabetes mellitus without complication (HCC)   . High cholesterol   . Hypertension   . Vertigo     Past Surgical History:  Procedure Laterality Date  . ABDOMINAL HYSTERECTOMY    . CHOLECYSTECTOMY    . CYST EXCISION       Inpatient Medications: Scheduled Meds: . chlorhexidine  15 mL Mouth Rinse BID  . Chlorhexidine Gluconate Cloth  6 each Topical Daily  . insulin aspart  0-9 Units Subcutaneous Q4H  . mouth rinse  15 mL Mouth Rinse q12n4p  . metoprolol tartrate  2.5 mg Intravenous Q6H  . nystatin   Topical TID  . sodium chloride flush  10-40 mL Intracatheter Q12H  . vancomycin  500 mg Oral Q6H   Continuous Infusions: . dextrose 5 % with KCl 20 mEq / L    . diltiazem (CARDIZEM) infusion Stopped (07/04/17 0600)  . heparin 900 Units/hr (07/04/17 1746)  . metronidazole Stopped (07/05/17 1101)   PRN Meds: acetaminophen, acetaminophen, LORazepam, ondansetron (ZOFRAN) IV, sodium chloride flush  Allergies:   No Known Allergies  Social History:   Social History  Social History  . Marital status: Divorced    Spouse name: N/A  . Number of children: N/A  . Years of education: N/A   Occupational History  . retired    Social History Main Topics  . Smoking status: Never Smoker  . Smokeless tobacco: Never Used  . Alcohol use No  . Drug use: No  . Sexual activity: Not on file   Other Topics Concern  . Not on file   Social History Narrative  . No narrative on file    Family History:   The patient's family history includes CAD in her father; CVA (age of onset: 3678) in her mother; Diabetes in her sister.  ROS:  Please see the history of present illness. info from chart. Unable to obtain active Sx 2/2 AMS Review of Systems  Constitution: Positive for weakness and malaise/fatigue. Negative for chills.  HENT: Negative for congestion.   Eyes: Negative.    Cardiovascular: Positive for irregular heartbeat and palpitations. Negative for chest pain and dyspnea on exertion.  Respiratory: Negative for cough and shortness of breath.   Endocrine: Negative for cold intolerance.       She does have diabetes, no thyroid disease  Skin: Negative for dry skin and rash.  Musculoskeletal: Negative for muscle cramps and muscle weakness.  Gastrointestinal: Positive for abdominal pain and diarrhea (Related to antibiotics and C. difficile). Negative for dysphagia and hematemesis.  Neurological: Positive for difficulty with concentration. Negative for dizziness and light-headedness.  Psychiatric/Behavioral: Positive for altered mental status. Negative for hallucinations, memory loss and suicidal ideas.  All other systems reviewed and are negative.  Physical Exam/Data:   Vitals:   07/05/17 0800 07/05/17 0900 07/05/17 1000 07/05/17 1202  BP: (!) 147/84 (!) 158/104 120/60 116/70  Pulse: (!) 102 (!) 112 (!) 108 (!) 111  Resp: 19 18 18 19   Temp:    97.7 F (36.5 C)  TempSrc:    Oral  SpO2: 100% 100% 100% 100%  Weight:      Height:        Intake/Output Summary (Last 24 hours) at 07/05/17 1535 Last data filed at 07/05/17 0800  Gross per 24 hour  Intake             1226 ml  Output              975 ml  Net              251 ml   Filed Weights   07/02/17 0400 07/03/17 0500 07/03/17 1500  Weight: 206 lb 2.1 oz (93.5 kg) 206 lb 12.7 oz (93.8 kg) 203 lb 7.8 oz (92.3 kg)   Body mass index is 33.86 kg/m.  General:  Well nourished, well developed, in no acute distress HEENT: normal Lymph: no adenopathy Neck: no JVD Endocrine:  No thryomegaly Vascular: No carotid bruits; 2+ pedal pulses  Cardiac:  normal S1, S2; RRR; no murmur gallup rub or click Lungs:  clear to auscultation bilaterally, no wheezing, rhonchi or rales listening ant.  Abd: soft, nontender, no hepatomegaly  Ext: no edema Musculoskeletal:  No deformities, pt does not follow command Skin:  warm and dry  Neuro:  Pt not following commands, she did answer no for pain or SOB and no to awareness of heart beating rapidly Psych:  FLAT AFFECT    Telemetry:  Telemetry was personally reviewed and demonstrates:  A fib with RVR 100 to 150  Relevant CV Studies:  ECHO 05/31/17 Study Conclusions  - Left  ventricle: The cavity size was normal. Wall thickness was at   the upper limits of normal. Systolic function was normal. The   estimated ejection fraction was in the range of 60% to 65%. Wall   motion was normal; there were no regional wall motion   abnormalities. The study is not technically sufficient to allow   evaluation of LV diastolic function. - Aortic valve: Mildly calcified annulus. Trileaflet. - Mitral valve: Calcified annulus. There was mild regurgitation. - Left atrium: The atrium was mildly to moderately dilated. - Right atrium: Central venous pressure (est): 8 mm Hg. - Atrial septum: No defect or patent foramen ovale was identified. - Tricuspid valve: There was trivial regurgitation. - Pulmonary arteries: PA peak pressure: 30 mm Hg (S). - Pericardium, extracardiac: There was no pericardial effusion.  Impressions:  - Upper normal LV wall thickness with LVEF 60-65%. Indeterminate   diastolic function. Mild to moderate left atrial enlargement.   Mildly calcified mitral annulus with mild mitral regurgitation.   Mildly sclerotic aortic valve. Trivial tricuspid regurgitation   with PASP estimated 30 mmHg  Laboratory Data:  Chemistry Recent Labs Lab 07/02/17 0450 07/03/17 0400 07/04/17 0211  NA 147* 149* 143  K 3.4* 3.6 3.1*  CL 116* 117* 112*  CO2 21* 23 23  GLUCOSE 160* 170* 162*  BUN 21* 17 12  CREATININE 0.97 0.91 0.82  CALCIUM 8.6* 8.7* 8.4*  GFRNONAA 56* >60 >60  GFRAA >60 >60 >60  ANIONGAP 10 9 8      Recent Labs Lab 06/29/17 1304 07/04/17 0211  PROT 7.2 4.9*  ALBUMIN 4.1 2.4*  AST 21 41  ALT 20 49  ALKPHOS 65 69  BILITOT 1.5* 0.4    Hematology Recent Labs Lab 07/03/17 0400 07/04/17 0211 07/05/17 0540  WBC 15.1* 15.2* 13.6*  RBC 3.53* 3.57* 3.54*  HGB 10.7* 10.6* 10.5*  HCT 32.2* 32.4* 32.1*  MCV 91.2 90.8 90.7  MCH 30.3 29.7 29.7  MCHC 33.2 32.7 32.7  RDW 13.8 13.7 13.5  PLT 346 314 307   Cardiac Enzymes Recent Labs Lab 06/29/17 1304 06/29/17 2241 06/30/17 0359 06/30/17 0948  TROPONINI 0.03* 0.06* 0.05* 0.04*   No results for input(s): TROPIPOC in the last 168 hours.  BNPNo results for input(s): BNP, PROBNP in the last 168 hours.  DDimer No results for input(s): DDIMER in the last 168 hours.  Radiology/Studies:  No results found.  Assessment and Plan:   1. Persistent a fib with improved on lopressor 2.5 mg IV every 6 hours,.  IV heparin, was on Eliquis prior to admit.  Normal EF on echo.  MD to see with either do DCCV vs. Leave in a fib if rate controlled, if leaving in a fib then would switch back to Eliquis.  HR  Poorly controlled.  will Add amiodarone to control rate. Dr. Herbie Baltimore has seen 2. AMS due to metabolic encephalopathy - still confused slow to improve CT and MRI brain neg.  Sister thinks she is a little better. 3. Acute on chronic renal failure- now improved wit Cr 0.82  4. DM-2 per IM 5. Elevated troponin mild, felt to be demand ischemia with acute illness no ischemic work up with normal EF 6. HTN --labile BP 7. UTI on admit zosyn stopped 8. C. Diff per IM on flagyl 9. Lower ext edema neg DVT  10. DNR  Signed, Nada Boozer, NP  07/05/2017 3:35 PM  I saw evaluated the patient along with Nada Boozer, NP-C.  After reviewing all the available  data and chart, we discussed the patients laboratory, study & physical findings as well as symptoms in detail. I agree with her findings, examination as well as impression recommendations as per our discussion.    Attending adjustments noted in italics.   We are asked to assist with management of atrial fibrillation. The patient apparently  had been on endocrine relation the cardioversion outpatient setting, but with her altered mental status we cannot be sure. Unfortunately that makes attending cardioversion difficult because it would require a TEE. At present with poorly controlled heart rate and labile blood pressures I would prefer to use amiodarone in relatively ill patient.  We're initiating amiodarone load. I will also increase metoprolol dose to 5 mg IV every 6 and we will then wean off diltiazem drip. Continue IV heparin, and converted back to by mouth DOAC when able to take by mouth.   Cardiology will follow along.    Bryan Lemma, M.D., M.S. Interventional Cardiologist   Pager # [phone] Phone # [phone] [address] [place], [place] [address]

## 2017-07-05 NOTE — Progress Notes (Signed)
Subjective: Remains very encephalopathic.   Exam: Vitals:   07/05/17 0800 07/05/17 0900  BP: (!) 147/84 (!) 158/104  Pulse: (!) 102 (!) 112  Resp: 19 18  Temp:      HEENT-  Normocephalic, no lesions, without obvious abnormality.  Normal external eye and conjunctiva.  Normal TM's bilaterally.  Normal auditory canals and external ears. Normal external nose, mucus membranes and septum.  Normal pharynx.    Neuro: confused and only follows one step simple commands.  CN: Pupils are equal and round. They are symmetrically reactive from 3-->2 mm. EOMI without nystagmus. Facial sensation is intact to light touch. Face is symmetric at rest with normal strength and mobility. Hearing is intact to conversational voice. Palate elevates symmetrically and uvula is midline. Voice is normal in tone, pitch and quality.  Motor: MAEW  Sensation: Intact to light touch.  DTRs: 1+, symmetric  Toes downgoing bilaterally. No pathologic reflexes.      Pertinent Labs/Diagnostics: WBC 13.6 (+) C Diff  EEG: Impression: This EEG is abnormal due to moderate diffuse slowing of the waking background with triphasic waves seen.  Clinical Correlation of the above findings indicates diffuse cerebral dysfunction that is non-specific in etiology and can be seen with hypoxic/ischemic injury, toxic/metabolic encephalopathies, or medication effect. Triphasic waves are typically seen with hepatic encephalopathy, but may be seen with other metabolic encephalopathies as well.  The absence of epileptiform discharges does not rule out a clinical diagnosis of epilepsy.    Impression: Toxic metabolic encephaolopathy   Neurology will S/O --discussed with Dr. Izola PriceMyers primary attending  Felicie Mornavid Dayquan Buys PA-C [facility] [phone] 07/05/2017, 9:36 AM

## 2017-07-06 LAB — GLUCOSE, CAPILLARY
GLUCOSE-CAPILLARY: 142 mg/dL — AB (ref 65–99)
GLUCOSE-CAPILLARY: 153 mg/dL — AB (ref 65–99)
GLUCOSE-CAPILLARY: 147 mg/dL — AB (ref 65–99)
GLUCOSE-CAPILLARY: 145 mg/dL — AB (ref 65–99)
Glucose-Capillary: 139 mg/dL — ABNORMAL HIGH (ref 65–99)
Glucose-Capillary: 149 mg/dL — ABNORMAL HIGH (ref 65–99)

## 2017-07-06 LAB — HEPARIN LEVEL (UNFRACTIONATED): Heparin Unfractionated: 0.38 [IU]/mL (ref 0.30–0.70)

## 2017-07-06 LAB — CBC
HEMATOCRIT: 33.3 % — AB (ref 36.0–46.0)
HEMOGLOBIN: 11 g/dL — AB (ref 12.0–15.0)
MCH: 29.6 pg (ref 26.0–34.0)
MCHC: 33 g/dL (ref 30.0–36.0)
MCV: 89.5 fL (ref 78.0–100.0)
Platelets: 330 10*3/uL (ref 150–400)
RBC: 3.72 MIL/uL — ABNORMAL LOW (ref 3.87–5.11)
RDW: 13.4 % (ref 11.5–15.5)
WBC: 13.3 10*3/uL — AB (ref 4.0–10.5)

## 2017-07-06 LAB — BASIC METABOLIC PANEL WITH GFR
Anion gap: 7 (ref 5–15)
BUN: 10 mg/dL (ref 6–20)
CO2: 23 mmol/L (ref 22–32)
Calcium: 8.3 mg/dL — ABNORMAL LOW (ref 8.9–10.3)
Chloride: 103 mmol/L (ref 101–111)
Creatinine, Ser: 0.72 mg/dL (ref 0.44–1.00)
GFR calc Af Amer: >60 mL/min (ref >60)
GFR calc non Af Amer: >60 mL/min (ref >60)
Glucose, Bld: 146 mg/dL — ABNORMAL HIGH (ref 65–99)
Potassium: 3.4 mmol/L — ABNORMAL LOW (ref 3.5–5.1)
Sodium: 133 mmol/L — ABNORMAL LOW (ref 135–145)

## 2017-07-06 LAB — MAGNESIUM: Magnesium: 1.2 mg/dL — ABNORMAL LOW (ref 1.7–2.4)

## 2017-07-06 MED ORDER — METOPROLOL TARTRATE 5 MG/5ML IV SOLN
5.0000 mg | INTRAVENOUS | Status: DC
  Administered 2017-07-06 – 2017-07-08 (×12): 5 mg via INTRAVENOUS
  Filled 2017-07-06 (×12): qty 5

## 2017-07-06 MED ORDER — POTASSIUM CHLORIDE CRYS ER 20 MEQ PO TBCR
40.0000 meq | EXTENDED_RELEASE_TABLET | Freq: Once | ORAL | Status: DC
  Filled 2017-07-06: qty 2

## 2017-07-06 MED ORDER — POTASSIUM CL IN DEXTROSE 5% 20 MEQ/L IV SOLN
20.0000 meq | INTRAVENOUS | Status: DC
  Administered 2017-07-06 – 2017-07-08 (×2): 20 meq via INTRAVENOUS
  Filled 2017-07-06 (×2): qty 1000

## 2017-07-06 NOTE — Progress Notes (Signed)
OT Cancellation Note  Patient Details Name: Deborah Benjamin MRN: [id number] DOB: 28-Jul-1941   Cancelled Treatment:    Reason Eval/Treat Not Completed: Fatigue/lethargy limiting ability to participate   Jeani HawkingWendi Olayinka Gathers, OTR/L [phone]   Jeani HawkingConarpe, Serai Tukes M 07/06/2017, 2:15 PM

## 2017-07-06 NOTE — Progress Notes (Signed)
PT Cancellation Note  Patient Details Name: Meridee ScoreFannie L Bossard MRN: [id number] DOB: 12-30-1940   Cancelled Treatment:    Reason Eval/Treat Not Completed: Fatigue/lethargy limiting ability to participate, spoke with NP will hold at this time.   Fabio AsaDevon J Cadence Haslam 07/06/2017, 2:02 PM Charlotte Crumbevon Tuff Clabo, PT DPT [facility] [phone]

## 2017-07-06 NOTE — Progress Notes (Signed)
Progress Note  Patient Name: Deborah Benjamin Date of Encounter: 07/06/2017  Primary Cardiologist: Dr. Percell Belt  Subjective   Remains lethargic and will not answer questions.  HR still elevated in the 120's despite increasing lopressor to 5mg  q6 hours and starting IV Amio. Dilt gtt stopped.  Inpatient Medications    Scheduled Meds: . chlorhexidine  15 mL Mouth Rinse BID  . insulin aspart  0-9 Units Subcutaneous Q4H  . mouth rinse  15 mL Mouth Rinse q12n4p  . metoprolol tartrate  5 mg Intravenous Q6H  . nystatin   Topical TID  . potassium chloride  40 mEq Oral Once  . sodium chloride flush  10-40 mL Intracatheter Q12H  . vancomycin  500 mg Oral Q6H   Continuous Infusions: . amiodarone 30 mg/hr (07/06/17 0812)  . dextrose 5 % with KCl 20 mEq / L 20 mEq (07/06/17 1012)  . diltiazem (CARDIZEM) infusion    . heparin 900 Units/hr (07/05/17 2014)  . metronidazole Stopped (07/06/17 1122)   PRN Meds: acetaminophen, acetaminophen, LORazepam, ondansetron (ZOFRAN) IV, sodium chloride flush   Vital Signs    Vitals:   07/06/17 0000 07/06/17 0400 07/06/17 0800 07/06/17 0900  BP: 128/71 (!) 123/93 (!) 129/93 138/75  Pulse:   (!) 55   Resp: 20 19 18    Temp: (!) 97.4 F (36.3 C) (!) 97.4 F (36.3 C) 97.6 F (36.4 C)   TempSrc: Oral Oral Oral   SpO2: 98% 96% 97%   Weight:      Height:        Intake/Output Summary (Last 24 hours) at 07/06/17 1108 Last data filed at 07/06/17 1022  Gross per 24 hour  Intake          1939.68 ml  Output              850 ml  Net          1089.68 ml   Filed Weights   07/02/17 0400 07/03/17 0500 07/03/17 1500  Weight: 206 lb 2.1 oz (93.5 kg) 206 lb 12.7 oz (93.8 kg) 203 lb 7.8 oz (92.3 kg)    Telemetry    Atrial fibrillation with RVR - Personally Reviewed  ECG    No new EKG to review - Personally Reviewed  Physical Exam   GEN: No acute distress.  Remains confused and does not answer questions.  Still lethargic Neck: No  JVD Cardiac: irregularly irregular, no murmurs, rubs, or gallops.  Respiratory: Clear to auscultation bilaterally. GI: Soft, nontender, non-distended  MS: No edema; No deformity. Neuro:  remains encephalopathic Psych: cannot assess due to encephalophathy  Labs    Chemistry Recent Labs Lab 06/29/17 1304  07/03/17 0400 07/04/17 0211 07/06/17 0424  NA 144  < > 149* 143 133*  K 3.4*  < > 3.6 3.1* 3.4*  CL 105  < > 117* 112* 103  CO2 20*  < > 23 23 23   GLUCOSE 183*  < > 170* 162* 146*  BUN 51*  < > 17 12 10   CREATININE 1.54*  < > 0.91 0.82 0.72  CALCIUM 10.2  < > 8.7* 8.4* 8.3*  PROT 7.2  --   --  4.9*  --   ALBUMIN 4.1  --   --  2.4*  --   AST 21  --   --  41  --   ALT 20  --   --  49  --   ALKPHOS 65  --   --  69  --  BILITOT 1.5*  --   --  0.4  --   GFRNONAA 32*  < > >60 >60 >60  GFRAA 37*  < > >60 >60 >60  ANIONGAP 19*  < > 9 8 7   < > = values in this interval not displayed.   Hematology Recent Labs Lab 07/04/17 0211 07/05/17 0540 07/06/17 0424  WBC 15.2* 13.6* 13.3*  RBC 3.57* 3.54* 3.72*  HGB 10.6* 10.5* 11.0*  HCT 32.4* 32.1* 33.3*  MCV 90.8 90.7 89.5  MCH 29.7 29.7 29.6  MCHC 32.7 32.7 33.0  RDW 13.7 13.5 13.4  PLT 314 307 330    Cardiac Enzymes Recent Labs Lab 06/29/17 1304 06/29/17 2241 06/30/17 0359 06/30/17 0948  TROPONINI 0.03* 0.06* 0.05* 0.04*   No results for input(s): TROPIPOC in the last 168 hours.   BNPNo results for input(s): BNP, PROBNP in the last 168 hours.   DDimer No results for input(s): DDIMER in the last 168 hours.   Radiology    No results found.  Cardiac Studies   2D echo 05/2017 Study Conclusions  - Left ventricle: The cavity size was normal. Wall thickness was at   the upper limits of normal. Systolic function was normal. The   estimated ejection fraction was in the range of 60% to 65%. Wall   motion was normal; there were no regional wall motion   abnormalities. The study is not technically sufficient to  allow   evaluation of LV diastolic function. - Aortic valve: Mildly calcified annulus. Trileaflet. - Mitral valve: Calcified annulus. There was mild regurgitation. - Left atrium: The atrium was mildly to moderately dilated. - Right atrium: Central venous pressure (est): 8 mm Hg. - Atrial septum: No defect or patent foramen ovale was identified. - Tricuspid valve: There was trivial regurgitation. - Pulmonary arteries: PA peak pressure: 30 mm Hg (S). - Pericardium, extracardiac: There was no pericardial effusion.  Impressions:  - Upper normal LV wall thickness with LVEF 60-65%. Indeterminate   diastolic function. Mild to moderate left atrial enlargement.   Mildly calcified mitral annulus with mild mitral regurgitation.   Mildly sclerotic aortic valve. Trivial tricuspid regurgitation   with PASP estimated 30 mmHg.   Patient Profile     76 y.o. female with a hx of atrial fib with hospitalization 05/30/17 through 05/31/2017 and hx of HTN, HLD, DM, hypomagnesium, seen 06/30/17 with hospitalization for a fib RVR and urosepsis, and has been transferred to [facility] for acute metabolic encephalopathy who is being seen by Cardiology for evaluation of atrial fib at the request of Dr. Izola Price.  Assessment & Plan    1. Persistent a fib  - HR remains elevated despite lopressor 5mg  IV every 6 hours and Amio - Cardizem gtt weaned off - increase lopressor to 5mg  q4 hours and continue IV amio - continue IV heparin (was on Eliquis prior to admit).  Once able to take PO can switch to Eliquis - 2D echo last month showed normal EF.    2.  AMS due to metabolic encephalopathy  - still confused slow to improve CT and MRI brain neg.    3.   Acute on chronic renal failure - creatinine now improved (1.64>>0.82>>0.72)  4.   DM-2 - per IM  5.  Elevated troponin - very mild elevation with flat trend - likely due to demand ischemia with acute illness - no ischemic work up with normal EF  6.  HTN  - BP  controlled  7.  C. Diff -  per IM on flagyl  8.  Lower ext edema  - neg DVT on venous dopplers  9.  DNR  10.  Hypokalemia - replete per [facility]  11., [facility]es[facility]   Signed, Armanda Magicraci Keilynn Marano, MD  07/06/2017, 11:08 AM

## 2017-07-06 NOTE — Progress Notes (Signed)
[facility] PROGRESS NOTE  Deborah Benjamin:[id number] DOB: [birth year] DOA: 06/29/2017  PCP: Pearson Grippe, MD  Brief History/Interval Summary: 76 year old Caucasian female with a past medical history of hypertension, hyperlipoidemia, diabetes, recently diagnosed atrial fibrillation, presented with altered mental status. Patient was noted to be in atrial fibrillation with RVR. There was also concern for UTI and the patient was treated with antibiotics. Patient remained encephalopathic. So she was transferred over from [facility] to [facility]. Seen by neurology and cardiology. Currently on amiodarone. Subsequently also found to have positive C. difficile.  Reason for Visit: Acute encephalopathy. Atrial fibrillation.  Consultants: Cardiology. Palliative medicine. Neurology.  Procedures:  EEG Impression: This EEG is abnormal due to moderate diffuse slowing of the waking background with triphasic waves seen.  Clinical Correlation of the above findings indicates diffuse cerebral dysfunction that is non-specific in etiology and can be seen with hypoxic/ischemic injury, toxic/metabolic encephalopathies, or medication effect. Triphasic waves are typically seen with hepatic encephalopathy, but may be seen with other metabolic encephalopathies as well.  The absence of epileptiform discharges does not rule out a clinical diagnosis of epilepsy.  Clinical correlation is advised.  Antibiotics: Initially on vancomycin and Zosyn. Currently on oral vancomycin and Flagyl  Subjective/Interval History: Patient with eyes open. Does not answer many questions.  ROS: Unable to do due to her encephalopathy  Objective:  Vital Signs  Vitals:   07/06/17 0400 07/06/17 0800 07/06/17 0900 07/06/17 1200  BP: (!) 123/93 (!) 129/93 138/75 134/88  Pulse:  (!) 55  (!) 108  Resp: 19 18  18   Temp: (!) 97.4 F (36.3 C) 97.6 F (36.4 C)    TempSrc: Oral Oral    SpO2: 96% 97%  100%    Weight:      Height:        Intake/Output Summary (Last 24 hours) at 07/06/17 1358 Last data filed at 07/06/17 1022  Gross per 24 hour  Intake          1390.28 ml  Output              450 ml  Net           940.28 ml   Filed Weights   07/02/17 0400 07/03/17 0500 07/03/17 1500  Weight: 93.5 kg (206 lb 2.1 oz) 93.8 kg (206 lb 12.7 oz) 92.3 kg (203 lb 7.8 oz)    General appearance: Patient is awake, alert, in no distress. Does not answer questions. Resp: clear to auscultation bilaterally Cardio: S1, S2 is irregularly irregular. Tachycardic. No S3, S4. No rubs, murmurs or bruit. GI: soft, non-tender; bowel sounds normal; no masses,  no organomegaly Extremities: extremities normal, atraumatic, no cyanosis or edema Neurologic: No focal deficits. Poor effort.  Lab Results:  Data Reviewed: I have personally reviewed following labs and imaging studies  CBC:  Recent Labs Lab 07/02/17 0450 07/03/17 0400 07/04/17 0211 07/05/17 0540 07/06/17 0424  WBC 12.6* 15.1* 15.2* 13.6* 13.3*  HGB 10.7* 10.7* 10.6* 10.5* 11.0*  HCT 31.3* 32.2* 32.4* 32.1* 33.3*  MCV 89.9 91.2 90.8 90.7 89.5  PLT 351 346 314 307 330    Basic Metabolic Panel:  Recent Labs Lab 07/01/17 0516 07/02/17 0450 07/03/17 0400 07/04/17 0211 07/06/17 0424  NA 147* 147* 149* 143 133*  K 2.8* 3.4* 3.6 3.1* 3.4*  CL 116* 116* 117* 112* 103  CO2 20* 21* 23 23 23   GLUCOSE 172* 160* 170* 162* 146*  BUN 27* 21* 17 12 10  CREATININE 1.18* 0.97 0.91 0.82 0.72  CALCIUM 8.5* 8.6* 8.7* 8.4* 8.3*  MG 1.1* 1.8  --   --  1.2*    GFR: Estimated Creatinine Clearance: 69.2 mL/min (by C-G formula based on SCr of 0.72 mg/dL).  Liver Function Tests:  Recent Labs Lab 07/04/17 0211  AST 41  ALT 49  ALKPHOS 69  BILITOT 0.4  PROT 4.9*  ALBUMIN 2.4*     Recent Labs Lab 06/30/17 0948  AMMONIA 11    Coagulation Profile:  Recent Labs Lab 06/29/17 2241  INR 1.07    Cardiac Enzymes:  Recent Labs Lab  06/29/17 2241 06/30/17 0359 06/30/17 0948  TROPONINI 0.06* 0.05* 0.04*    CBG:  Recent Labs Lab 07/05/17 1938 07/05/17 2333 07/06/17 0356 07/06/17 0804 07/06/17 1222  GLUCAP 149* 163* 139* 149* 142*     Recent Results (from the past 240 hour(s))  Blood Culture (routine x 2)     Status: None   Collection Time: 06/29/17  1:35 PM  Result Value Ref Range Status   Specimen Description BLOOD RIGHT WRIST  Final   Special Requests   Final    BOTTLES DRAWN AEROBIC AND ANAEROBIC Blood Culture results may not be optimal due to an inadequate volume of blood received in culture bottles   Culture NO GROWTH 5 DAYS  Final   Report Status 07/04/2017 FINAL  Final  Urine culture     Status: Abnormal   Collection Time: 06/29/17  1:42 PM  Result Value Ref Range Status   Specimen Description URINE, CLEAN CATCH  Final   Special Requests NONE  Final   Culture MULTIPLE SPECIES PRESENT, SUGGEST RECOLLECTION (A)  Final   Report Status 07/01/2017 FINAL  Final  Culture, blood (Routine X 2) w Reflex to ID Panel     Status: None   Collection Time: 06/29/17  3:55 PM  Result Value Ref Range Status   Specimen Description BLOOD LEFT HAND  Final   Special Requests   Final    Blood Culture results may not be optimal due to an inadequate volume of blood received in culture bottles   Culture NO GROWTH 5 DAYS  Final   Report Status 07/04/2017 FINAL  Final  MRSA PCR Screening     Status: None   Collection Time: 06/29/17 10:05 PM  Result Value Ref Range Status   MRSA by PCR NEGATIVE NEGATIVE Final    Comment:        The GeneXpert MRSA Assay (FDA approved for NASAL specimens only), is one component of a comprehensive MRSA colonization surveillance program. It is not intended to diagnose MRSA infection nor to guide or monitor treatment for MRSA infections.   MRSA PCR Screening     Status: None   Collection Time: 07/03/17  2:29 PM  Result Value Ref Range Status   MRSA by PCR NEGATIVE NEGATIVE Final      Comment:        The GeneXpert MRSA Assay (FDA approved for NASAL specimens only), is one component of a comprehensive MRSA colonization surveillance program. It is not intended to diagnose MRSA infection nor to guide or monitor treatment for MRSA infections.   C difficile quick scan w PCR reflex     Status: Abnormal   Collection Time: 07/03/17  5:27 PM  Result Value Ref Range Status   C Diff antigen POSITIVE (A) NEGATIVE Final   C Diff toxin NEGATIVE NEGATIVE Final   C Diff interpretation Results are indeterminate. See PCR results.  Final  Clostridium Difficile by PCR     Status: Abnormal   Collection Time: 07/03/17  5:27 PM  Result Value Ref Range Status   Toxigenic C Difficile by pcr POSITIVE (A) NEGATIVE Final    Comment: Positive for toxigenic C. difficile with little to no toxin production. Only treat if clinical presentation suggests symptomatic illness.      Radiology Studies: No results found.   Medications:  Scheduled: . chlorhexidine  15 mL Mouth Rinse BID  . insulin aspart  0-9 Units Subcutaneous Q4H  . mouth rinse  15 mL Mouth Rinse q12n4p  . metoprolol tartrate  5 mg Intravenous Q4H  . nystatin   Topical TID  . potassium chloride  40 mEq Oral Once  . sodium chloride flush  10-40 mL Intracatheter Q12H  . vancomycin  500 mg Oral Q6H   Continuous: . amiodarone 30 mg/hr (07/06/17 0812)  . dextrose 5 % with KCl 20 mEq / L 20 mEq (07/06/17 1012)  . heparin 900 Units/hr (07/05/17 2014)  . metronidazole Stopped (07/06/17 1122)   ZOX:[id number], acetaminophen, LORazepam, ondansetron (ZOFRAN) IV, sodium chloride flush  Assessment/Plan:  Principal Problem:   Acute encephalopathy Active Problems:   Essential hypertension   Diabetes mellitus type 2 in obese [facility])   Atrial fibrillation with RVR (HCC)   Acute renal failure superimposed on stage 3 chronic kidney disease (HCC)   Urinary tract infection without hematuria   Palliative care encounter   Goals  of care, counseling/discussion    Acute metabolic encephalopathy/tremor/rhythmic movements Etiology thought to be multifactorial including infection in the form of UTI, hyponatremia, C. difficile, acute renal failure and atrial fibrillation. Patient has been very slow to improve. Patient underwent imaging studies in the form of CT scan and MRI brain which have not revealed any clear cut reason. Patient also underwent EEG which did not show any epileptiform activity. Patient has been seen by neurology. Vitamin B-12 and ammonia level normal. No further testing is thought to be necessary. Palliative medicine was consulted.  Atrial fibrillation with RVR/elevated troponin/demand ischemia Chads2vasc score is 5. Patient is being seen by cardiology. Patient is on amiodarone infusion. She had previously been on Cardizem. Patient is also on IV metoprolol. On IV heparin. Was previously on oral anticoagulants.  C. difficile colitis. Patient with significant diarrhea, which was positive for C. difficile. Currently on oral vancomycin and intravenous Flagyl. Continue to monitor closely.  Acute on chronic kidney disease stage III. Renal function close to baseline.  Urinary tract infection. No growth noted on urine cultures. Antibiotics were discontinued.  Hypernatremia/hypokalemia Resolved with IV fluids. Potassium to be repleted.  Essential hypertension. Stable.  Diabetes mellitus type 2. Holding metformin. Last HbA1c was 6.6. Continue SSI.  DVT Prophylaxis: On IV heparin    Code Status: Appears to be DO NOT RESUSCITATE currently  Family Communication: No family at bedside  Disposition Plan: As outlined above. PT and OT.    LOS: 7 days   [facility]  [facility] Pager [phone] 07/06/2017, 1:58 PM  If 7PM-7AM, please contact night-coverage at [email or web address], password [facility]

## 2017-07-06 NOTE — Progress Notes (Signed)
Daily Progress Note   Patient Name: Deborah Benjamin       Date: 07/06/2017 DOB: April 09, 1941  Age: 76 y.o. MRN#: [id number] Attending Physician: Bonnielee Haff, MD Primary Care Physician: Jani Gravel, MD Admit Date: 06/29/2017  Reason for Consultation/Follow-up: Establishing goals of care  Subjective: Deborah Benjamin is slightly more alert at times but inconsistent.   Length of Stay: 7  Current Medications: Scheduled Meds:  . chlorhexidine  15 mL Mouth Rinse BID  . insulin aspart  0-9 Units Subcutaneous Q4H  . mouth rinse  15 mL Mouth Rinse q12n4p  . metoprolol tartrate  5 mg Intravenous Q4H  . nystatin   Topical TID  . potassium chloride  40 mEq Oral Once  . sodium chloride flush  10-40 mL Intracatheter Q12H  . vancomycin  500 mg Oral Q6H    Continuous Infusions: . amiodarone 30 mg/hr (07/06/17 0812)  . dextrose 5 % with KCl 20 mEq / L 20 mEq (07/06/17 1012)  . heparin 900 Units/hr (07/05/17 2014)  . metronidazole Stopped (07/06/17 1122)    PRN Meds: acetaminophen, acetaminophen, LORazepam, ondansetron (ZOFRAN) IV, sodium chloride flush  Physical Exam  Constitutional: She appears well-developed. She appears lethargic. She appears ill.  Obese   HENT:  Head: Normocephalic and atraumatic.  Cardiovascular: An irregularly irregular rhythm present. Tachycardia present.   Pulmonary/Chest: Effort normal. No accessory muscle usage. No tachypnea. No respiratory distress.  Abdominal: Soft. Normal appearance.  Neurological: She appears lethargic. She is disoriented.  Nursing note and vitals reviewed.           Vital Signs: BP 134/88 (BP Location: Right Wrist)   Pulse (!) 108   Temp 97.6 F (36.4 C) (Oral)   Resp 18   Ht '5\' 5"'$  (1.651 m)   Wt 92.3 kg (203 lb 7.8 oz)   SpO2 100%    BMI 33.86 kg/m  SpO2: SpO2: 100 % O2 Device: O2 Device: Not Delivered O2 Flow Rate: O2 Flow Rate (L/min): 2 L/min  Intake/output summary:   Intake/Output Summary (Last 24 hours) at 07/06/17 1417 Last data filed at 07/06/17 1022  Gross per 24 hour  Intake          1390.28 ml  Output              450 ml  Net  940.28 ml   LBM: Last BM Date: 07/05/17 Baseline Weight: Weight: 99.8 kg (220 lb) Most recent weight: Weight: 92.3 kg (203 lb 7.8 oz)       Palliative Assessment/Data: 20%    Flowsheet Rows     Most Recent Value  Intake Tab  Referral Department  Hospitalist  Unit at Time of Referral  ICU  Palliative Care Primary Diagnosis  Cardiac  Date Notified  07/01/17  Palliative Care Type  New Palliative care  Reason for referral  Clarify Goals of Care, Psychosocial or Spiritual support  Date of Admission  06/29/17  Date first seen by Palliative Care  07/01/17  # of days Palliative referral response time  0 Day(s)  # of days IP prior to Palliative referral  2  Clinical Assessment  Pain Max last 24 hours  Not able to report  Pain Min Last 24 hours  Not able to report  Dyspnea Max Last 24 Hours  Not able to report  Dyspnea Min Last 24 hours  Not able to report  Psychosocial & Spiritual Assessment  Palliative Care Outcomes  Patient/Family meeting held?  No [Patient unable to participate, left voicemail for sister]  Patient/Family wishes: Interventions discontinued/not started   Mechanical Ventilation      Patient Active Problem List   Diagnosis Date Noted  . Urinary tract infection without hematuria   . Palliative care encounter   . Goals of care, counseling/discussion   . Atrial fibrillation with RVR ([place]) 06/30/2017  . Acute renal failure superimposed on stage 3 chronic kidney disease ([place]) 06/30/2017  . Acute encephalopathy 06/29/2017  . Sepsis ([place]) 06/29/2017  . AKI (acute kidney injury) ([place]) 06/29/2017  . Hyponatremia 05/30/2017  . Atrial fibrillation  and flutter ([place]) 05/30/2017  . Essential hypertension 05/30/2017  . Vertigo 05/30/2017  . Diabetes mellitus type 2 in obese ([place]) 05/30/2017  . Hyperlipidemia 05/30/2017    Palliative Care Assessment & Plan   HPI: 76 y.o. female  with past medical history of diabetes without complications, high blood pressure and high cholesterol, atrial fibrillation, history of gallbladder surgery and abdominal hysterectomy admitted on 06/29/2017 with acute encephalopathy possibly related to sepsis. Treated for UTI but remains very lethargic with encephalopathy.    Assessment: I met today at "Boo" or "Lucy's" bedside - no family today. Her eyes are open today which is new but when she does respond she is very delayed and unable to tell me where she is, or even her sister or dog's names [name] and Blomkest). Very confused still and still very lethargic.   I called and spoke with her sister, [name], who is her surrogate Media planner. I did mention to her that she is still unable to awaken enough for any intake. We did briefly discuss feeding tubes if indicated and [name] says that she would be okay with temporary nasal feeding tube but would NOT want long term feeding/PEG per their previous discussions for life prolonging measures. Hopefully we can avoid.   MRI does show chronic small vessel ischemia and I shared with family this could be concerning for dementia and cognitive decline which could impact her progression as well. Will continue to follow.   Recommendations/Plan:  Continue treatment of C. diff.   No pain/discomfort.  Avoid sedating medications.   Code Status:  DNR confirmed with sister (she says they filled out "paperwork" together but cannot currently find)  Prognosis:   Unable to determine  Discharge Planning:  To Be Determined  Thank you for  allowing the Palliative Medicine Team to assist in the care of this patient.   Total Time 62mn Prolonged Time Billed  no       Greater  than 50%  of this time was spent counseling and coordinating care related to the above assessment and plan.  AVinie Sill NP Palliative Medicine Team Pager # [phone](M-F 8a-5p) Team Phone # [phone](Nights/Weekends)

## 2017-07-06 NOTE — Progress Notes (Signed)
ANTICOAGULATION CONSULT NOTE - Follow up  Pharmacy Consult for Heparin Indication: atrial fibrillation  No Known Allergies  Patient Measurements: Height: 5\' 5"  (165.1 cm) Weight: 203 lb 7.8 oz (92.3 kg) IBW/kg (Calculated) : 57  Vital Signs: Temp: 97.6 F (36.4 C) (07/11 0800) Temp Source: Oral (07/11 0800) BP: 129/93 (07/11 0800) Pulse Rate: 55 (07/11 0800)  Labs:  Recent Labs  07/04/17 0211 07/05/17 0540 07/05/17 0607 07/06/17 0424  HGB 10.6* 10.5*  --  11.0*  HCT 32.4* 32.1*  --  33.3*  PLT 314 307  --  330  HEPARINUNFRC 0.40  --  0.32 0.38  CREATININE 0.82  --   --  0.72   Estimated Creatinine Clearance: 69.2 mL/min (by C-G formula based on SCr of 0.72 mg/dL).  Assessment:  [age] YOF with recent diagnosis of Afib earlier in June and was on apixaban PTA however with questionable compliance admitted on with AMS and transitioned to heparin for anticoagulation until po intake more reliable.   Heparin level this morning remains therapeutic (HL 0.38, goal of 0.3-0.7). CBC stable - no bleeding noted at this time.  Goal of Therapy:  Heparin level 0.3-0.7 units/ml Monitor platelets by anticoagulation protocol: Yes   Plan:  1. Continue Heparin at 900 units/hr (9 ml/hr) 2. Will continue to monitor for any signs/symptoms of bleeding and will follow up with heparin level in the a.m.   Thank you for allowing pharmacy to be a part of this patient's care.  Pollyann SamplesAndy Alger Kerstein, PharmD, BCPS Clinical Pharmacist Clinical phone for 07/07/2017 from 7a-3:30p: [phone] If after 3:30p, please call main pharmacy at: [phone]

## 2017-07-06 NOTE — Evaluation (Signed)
Clinical/Bedside Swallow Evaluation Patient Details  Name: Deborah Benjamin MRN: [id number] Date of Birth: Dec 14, 1941  Today's Date: 07/06/2017 Time: SLP Start Time (ACUTE ONLY): 0851 SLP Stop Time (ACUTE ONLY): 0915 SLP Time Calculation (min) (ACUTE ONLY): 24 min  Past Medical History:  Past Medical History:  Diagnosis Date  . Atrial fibrillation (HCC)   . Diabetes mellitus without complication (HCC)   . High cholesterol   . Hypertension   . Vertigo    Past Surgical History:  Past Surgical History:  Procedure Laterality Date  . ABDOMINAL HYSTERECTOMY    . CHOLECYSTECTOMY    . CYST EXCISION     HPI:  Deborah L Dameronis a 76 y.o.femalewith medical history significant of HTN, HLD, DM, vertigo and recent (6/4-5) admission for new-onset afib and hyponatremia presenting with altered mental status. 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 Diagnosed with acute metabolic encephalopathy, A-fib, acute on chronic renal failure, sepsis (urine source vs possible aspiration). CXR 7/5 aortic atherosclerosis. No edema or consolidation.   Assessment / Plan / Recommendation Clinical Impression  Pt slow to arouse; lethargic but able to follow commands for oral-motor exam given repetition and extra time. Generalized oral and overall weakness; low vocal intensity; unable to cough or swallow volitionally. Delayed cough following water. Recommend objective assessment with FEES although would defer to tomorrow for increased ability to maintain alertness although this is improving. Per nursing pt did not arrive from [facility] with a NGT and has not had nutrition. Advised RN pt would not be able to safely swallow potassium pill. SLP plans to see tomorrow for readiness for objective swallow evaluation.  SLP Visit Diagnosis: Dysphagia, unspecified (R13.10)    Aspiration Risk  Moderate aspiration risk    Diet Recommendation NPO         Other  Recommendations Oral Care Recommendations: Oral care QID   Follow up Recommendations  (TBD)      Frequency and Duration            Prognosis        Swallow Study   General HPI: Deborah L Dameronis a 76 y.o.femalewith medical history significant of HTN, HLD, DM, vertigo and recent (6/4-5) admission for new-onset afib and hyponatremia presenting with altered mental status. 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 Diagnosed with acute metabolic encephalopathy, A-fib, acute on chronic renal failure, sepsis (urine source vs possible aspiration). CXR 7/5 aortic atherosclerosis. No edema or consolidation. Type of Study: Bedside Swallow Evaluation Previous Swallow Assessment:  (none) Diet Prior to this Study: NPO Temperature Spikes Noted: No Respiratory Status: Room air History of Recent Intubation: No Behavior/Cognition: Cooperative;Lethargic/Drowsy;Requires cueing Oral Cavity Assessment: Dry Oral Care Completed by SLP: Yes Oral Cavity - Dentition: Adequate natural dentition Vision: Functional for self-feeding Self-Feeding Abilities: Total assist;Needs assist Patient Positioning: Upright in bed Baseline Vocal Quality: Low vocal intensity Volitional Cough: Cognitively unable to elicit Volitional Swallow: Unable to elicit    Oral/Motor/Sensory Function Overall Oral Motor/Sensory Function: Generalized oral weakness   Ice Chips Ice chips: Not tested   Thin Liquid Thin Liquid: Impaired Presentation: Cup Oral Phase Impairments: Reduced labial seal Oral Phase Functional Implications: Right anterior spillage;Left anterior spillage Pharyngeal  Phase Impairments: Cough - Delayed;Suspected delayed Swallow    Nectar Thick Nectar Thick Liquid: Not tested   Honey Thick Honey Thick Liquid: Not tested   Puree Puree:  Impaired Oral Phase Impairments: Reduced labial seal Pharyngeal Phase  Impairments: Suspected delayed Swallow   Solid   GO   Solid: Not tested        Deborah Benjamin, Deborah Benjamin 07/06/2017,9:29 AM   Deborah Benjamin M.Ed [employer] Pager [phone]

## 2017-07-07 ENCOUNTER — Inpatient Hospital Stay (HOSPITAL_COMMUNITY): Payer: Medicare Other

## 2017-07-07 DIAGNOSIS — R1310 Dysphagia, unspecified: Secondary | ICD-10-CM

## 2017-07-07 DIAGNOSIS — A0472 Enterocolitis due to Clostridium difficile, not specified as recurrent: Secondary | ICD-10-CM

## 2017-07-07 DIAGNOSIS — E876 Hypokalemia: Secondary | ICD-10-CM

## 2017-07-07 LAB — BASIC METABOLIC PANEL
ANION GAP: 9 (ref 5–15)
BUN: 9 mg/dL (ref 6–20)
CHLORIDE: 102 mmol/L (ref 101–111)
CO2: 25 mmol/L (ref 22–32)
Calcium: 8.2 mg/dL — ABNORMAL LOW (ref 8.9–10.3)
Creatinine, Ser: 0.77 mg/dL (ref 0.44–1.00)
GFR calc non Af Amer: >60 mL/min (ref >60)
Glucose, Bld: 135 mg/dL — ABNORMAL HIGH (ref 65–99)
Potassium: 3.3 mmol/L — ABNORMAL LOW (ref 3.5–5.1)
Sodium: 136 mmol/L (ref 135–145)

## 2017-07-07 LAB — HEPARIN LEVEL (UNFRACTIONATED): Heparin Unfractionated: 0.3 IU/mL (ref 0.30–0.70)

## 2017-07-07 LAB — CBC
HCT: 33.5 % — ABNORMAL LOW (ref 36.0–46.0)
HEMOGLOBIN: 11.2 g/dL — AB (ref 12.0–15.0)
MCH: 29.8 pg (ref 26.0–34.0)
MCHC: 33.4 g/dL (ref 30.0–36.0)
MCV: 89.1 fL (ref 78.0–100.0)
PLATELETS: 332 10*3/uL (ref 150–400)
RBC: 3.76 MIL/uL — ABNORMAL LOW (ref 3.87–5.11)
RDW: 13.4 % (ref 11.5–15.5)
WBC: 13.9 10*3/uL — ABNORMAL HIGH (ref 4.0–10.5)

## 2017-07-07 LAB — GLUCOSE, CAPILLARY
GLUCOSE-CAPILLARY: 122 mg/dL — AB (ref 65–99)
GLUCOSE-CAPILLARY: 122 mg/dL — AB (ref 65–99)
GLUCOSE-CAPILLARY: 120 mg/dL — AB (ref 65–99)
Glucose-Capillary: 130 mg/dL — ABNORMAL HIGH (ref 65–99)
Glucose-Capillary: 160 mg/dL — ABNORMAL HIGH (ref 65–99)
Glucose-Capillary: 136 mg/dL — ABNORMAL HIGH (ref 65–99)

## 2017-07-07 MED ORDER — POTASSIUM CHLORIDE 10 MEQ/100ML IV SOLN
10.0000 meq | INTRAVENOUS | Status: AC
  Administered 2017-07-07 (×4): 10 meq via INTRAVENOUS
  Filled 2017-07-07 (×4): qty 100

## 2017-07-07 NOTE — Progress Notes (Signed)
Modified Barium Swallow Progress Note  Patient Details  Name: Meridee ScoreFannie L Campanella MRN: [id number] Date of Birth: 18-Jan-1941  Today's Date: 07/07/2017  Modified Barium Swallow completed.  Full report located under Chart Review in the Imaging Section.  Brief recommendations include the following:  Clinical Impression  Pt mildly drowsy during MBS however alert enough for MBS. Oral holding and delayed transit likely due to cognitive impairments versus a true oral dysphagia. Holding boluses with delayed oral transit and unable to effecitvely masticate solid texture which was expectorated. One episode of flash penetration with thin with multiple cup and straw sips. Mild vallecular and pyriform sinus residue cleared with cues for second swallow. Recommend thin liquids, and puree (D1) and thin liquids, small sips straw allowed, crush meds, full supervision, sit upright and eat when alert.    Swallow Evaluation Recommendations       SLP Diet Recommendations: Dysphagia 1 (Puree) solids;Thin liquid   Liquid Administration via: Cup;Straw   Medication Administration: Crushed with puree   Supervision: Full assist for feeding;Full supervision/cueing for compensatory strategies;Staff to assist with self feeding   Compensations: Slow rate;Minimize environmental distractions;Small sips/bites   Postural Changes: Seated upright at 90 degrees   Oral Care Recommendations: Oral care BID        [name], Jalah Warmuth Willis 07/07/2017,3:36 PM   [name] M.Ed [employer] Pager [phone]

## 2017-07-07 NOTE — Progress Notes (Signed)
Progress Note  Patient Name: Deborah Benjamin Date of Encounter: 07/07/2017  Primary Cardiologist: Dr. Percell Belt  Subjective   Remains lethargic and will not answer questions.  HR much improved and now in the 90's  Inpatient Medications    Scheduled Meds: . chlorhexidine  15 mL Mouth Rinse BID  . insulin aspart  0-9 Units Subcutaneous Q4H  . mouth rinse  15 mL Mouth Rinse q12n4p  . metoprolol tartrate  5 mg Intravenous Q4H  . nystatin   Topical TID  . sodium chloride flush  10-40 mL Intracatheter Q12H  . vancomycin  500 mg Oral Q6H   Continuous Infusions: . amiodarone 30 mg/hr (07/07/17 0519)  . dextrose 5 % with KCl 20 mEq / L 20 mEq (07/06/17 1012)  . heparin 900 Units/hr (07/06/17 2039)  . metronidazole 500 mg (07/07/17 1020)  . potassium chloride     PRN Meds: acetaminophen, acetaminophen, LORazepam, ondansetron (ZOFRAN) IV, sodium chloride flush   Vital Signs    Vitals:   07/07/17 0200 07/07/17 0300 07/07/17 0400 07/07/17 0803  BP: 117/87 136/89 (!) 147/105 121/82  Pulse: (!) 112 100 (!) 105   Resp: 18 19 20    Temp:    98 F (36.7 C)  TempSrc:    Axillary  SpO2: 96% 95% 99%   Weight:      Height:        Intake/Output Summary (Last 24 hours) at 07/07/17 1030 Last data filed at 07/07/17 0912  Gross per 24 hour  Intake           829.76 ml  Output             1030 ml  Net          -200.24 ml   Filed Weights   07/02/17 0400 07/03/17 0500 07/03/17 1500  Weight: 206 lb 2.1 oz (93.5 kg) 206 lb 12.7 oz (93.8 kg) 203 lb 7.8 oz (92.3 kg)    Telemetry    Atrial fibrillation with RVR - Personally Reviewed  ECG    No new EKG to review - Personally Reviewed  Physical Exam   GEN: No acute distress.  Remains confused and does not answer questions.  Still lethargic Neck: No JVD Cardiac: irregularly irregular, no murmurs, rubs, or gallops.  Respiratory: Clear to auscultation bilaterally. GI: Soft, nontender, non-distended  MS: No edema; No  deformity. Neuro:  remains encephalopathic Psych: cannot assess due to encephalophathy  Labs    Chemistry  Recent Labs Lab 07/04/17 0211 07/06/17 0424 07/07/17 0335  NA 143 133* 136  K 3.1* 3.4* 3.3*  CL 112* 103 102  CO2 23 23 25   GLUCOSE 162* 146* 135*  BUN 12 10 9   CREATININE 0.82 0.72 0.77  CALCIUM 8.4* 8.3* 8.2*  PROT 4.9*  --   --   ALBUMIN 2.4*  --   --   AST 41  --   --   ALT 49  --   --   ALKPHOS 69  --   --   BILITOT 0.4  --   --   GFRNONAA >60 >60 >60  GFRAA >60 >60 >60  ANIONGAP 8 7 9      Hematology  Recent Labs Lab 07/05/17 0540 07/06/17 0424 07/07/17 0335  WBC 13.6* 13.3* 13.9*  RBC 3.54* 3.72* 3.76*  HGB 10.5* 11.0* 11.2*  HCT 32.1* 33.3* 33.5*  MCV 90.7 89.5 89.1  MCH 29.7 29.6 29.8  MCHC 32.7 33.0 33.4  RDW 13.5 13.4 13.4  PLT 307 330 332    Cardiac EnzymesNo results for input(s): TROPONINI in the last 168 hours. No results for input(s): TROPIPOC in the last 168 hours.   BNPNo results for input(s): BNP, PROBNP in the last 168 hours.   DDimer No results for input(s): DDIMER in the last 168 hours.   Radiology    No results found.  Cardiac Studies   2D echo 05/2017 Study Conclusions  - Left ventricle: The cavity size was normal. Wall thickness was at   the upper limits of normal. Systolic function was normal. The   estimated ejection fraction was in the range of 60% to 65%. Wall   motion was normal; there were no regional wall motion   abnormalities. The study is not technically sufficient to allow   evaluation of LV diastolic function. - Aortic valve: Mildly calcified annulus. Trileaflet. - Mitral valve: Calcified annulus. There was mild regurgitation. - Left atrium: The atrium was mildly to moderately dilated. - Right atrium: Central venous pressure (est): 8 mm Hg. - Atrial septum: No defect or patent foramen ovale was identified. - Tricuspid valve: There was trivial regurgitation. - Pulmonary arteries: PA peak pressure: 30  mm Hg (S). - Pericardium, extracardiac: There was no pericardial effusion.  Impressions:  - Upper normal LV wall thickness with LVEF 60-65%. Indeterminate   diastolic function. Mild to moderate left atrial enlargement.   Mildly calcified mitral annulus with mild mitral regurgitation.   Mildly sclerotic aortic valve. Trivial tricuspid regurgitation   with PASP estimated 30 mmHg.   Patient Profile     76 y.o. female with a hx of atrial fib with hospitalization 05/30/17 through 05/31/2017 and hx of HTN, HLD, DM, hypomagnesium, seen 06/30/17 with hospitalization for a fib RVR and urosepsis, and has [facility] for [facility] metabolic encephalopathy who is being seen by Cardiology for evaluation of atrial fib at the request of Dr. Izola PriceMyers.  Assessment & Plan    1. Persistent a fib  - HR much improved after increasing lopressor to 5mg  IV every 6 hours.  Continue Amio - Cardizem gtt weaned off - continue IV heparin (was on Eliquis prior to admit).  Once able to take PO can switch to Eliquis - 2D echo last month showed normal EF.    2.  AMS due to metabolic encephalopathy  - still confused slow to improve CT and MRI brain neg.    3.   Acute on chronic renal failure - creatinine now improved (1.64>>0.82>>0.72>>0.77)  4.   DM-2 - per IM  5.  Elevated troponin - very mild elevation with flat trend - likely due to demand ischemia with acute illness - no ischemic work up with normal EF  6.  HTN  - BP controlled  7.  C. Diff - per IM on flagyl  8.  Lower ext edema  - neg DVT on venous dopplers  9.  DNR -  Palliative care is following and family considering temporary tube feeds but stated that patient would not want longterm feeding/PEG. Speech therapy to assess swallowing today  10.  Hypokalemia - replete per [facility]  11., Hypomagnesemia - replete per [facility]   Signed, Armanda Magicraci Ronny Ruddell, MD  07/07/2017, 10:30 AM

## 2017-07-07 NOTE — Progress Notes (Signed)
ANTICOAGULATION CONSULT NOTE - Follow up  Pharmacy Consult for Heparin Indication: atrial fibrillation  No Known Allergies  Patient Measurements: Height: 5\' 5"  (165.1 cm) Weight: 203 lb 7.8 oz (92.3 kg) IBW/kg (Calculated) : 57  Vital Signs: Temp: 98 F (36.7 C) (07/12 0803) Temp Source: Axillary (07/12 0803) BP: 121/82 (07/12 0803) Pulse Rate: 105 (07/12 0400)  Labs:  Recent Labs  07/05/17 0540 07/05/17 0607 07/06/17 0424 07/07/17 0335  HGB 10.5*  --  11.0* 11.2*  HCT 32.1*  --  33.3* 33.5*  PLT 307  --  330 332  HEPARINUNFRC  --  0.32 0.38 0.30  CREATININE  --   --  0.72 0.77   Estimated Creatinine Clearance: 69.2 mL/min (by C-G formula based on SCr of 0.77 mg/dL).  Assessment:  [age] YOF with recent diagnosis of Afib earlier in June and was on apixaban PTA however with questionable compliance admitted on with AMS and transitioned to heparin for anticoagulation until po intake more reliable.   Heparin level this morning remains therapeutic (HL 0.3, goal of 0.3-0.7). CBC stable - no bleeding noted at this time.  Goal of Therapy:  Heparin level 0.3-0.7 units/ml Monitor platelets by anticoagulation protocol: Yes   Plan:  1. Continue Heparin at 900 units/hr (9 ml/hr) 2. Will continue to monitor for any signs/symptoms of bleeding and will follow up with heparin level in the a.m.   Thank you for allowing pharmacy to be a part of this patient's care.  Georgina PillionElizabeth Kuzey Ogata, PharmD, BCPS Clinical Pharmacist Clinical phone for 07/07/2017 from 7a-3:30p: [phone] If after 3:30p, please call main pharmacy at: [phone] 07/07/2017 8:38 AM

## 2017-07-07 NOTE — Progress Notes (Signed)
[facility] PROGRESS NOTE  Deborah Benjamin WUJ:[id number] DOB: 28-Jan-1941 DOA: 06/29/2017  PCP: Pearson GrippeKim, James, MD  Brief History/Interval Summary: 76 year old Caucasian female with a past medical history of hypertension, hyperlipoidemia, diabetes, recently diagnosed atrial fibrillation, presented with altered mental status. Patient was noted to be in atrial fibrillation with RVR. There was also concern for UTI and the patient was treated with antibiotics. Patient remained encephalopathic. So she was transferred over from [facility] to [facility]. Seen by neurology and cardiology. Currently on amiodarone. Subsequently also found to have positive C. difficile.  Reason for Visit: Acute encephalopathy. Atrial fibrillation.  Consultants: Cardiology. Palliative medicine. Neurology.  Procedures:  EEG Impression: This EEG is abnormal due to moderate diffuse slowing of the waking background with triphasic waves seen.  Clinical Correlation of the above findings indicates diffuse cerebral dysfunction that is non-specific in etiology and can be seen with hypoxic/ischemic injury, toxic/metabolic encephalopathies, or medication effect. Triphasic waves are typically seen with hepatic encephalopathy, but may be seen with other metabolic encephalopathies as well.  The absence of epileptiform discharges does not rule out a clinical diagnosis of epilepsy.  Clinical correlation is advised.  Antibiotics: Initially on vancomycin and Zosyn. Currently on oral vancomycin and Flagyl  Subjective/Interval History: Patient with eyes open. Denies any pain. Her sister is at the bedside. Apparently, she was not able to swallow her tablets this morning.  ROS: Unable to do due to her encephalopathy  Objective:  Vital Signs  Vitals:   07/07/17 0200 07/07/17 0300 07/07/17 0400 07/07/17 0803  BP: 117/87 136/89 (!) 147/105 121/82  Pulse: (!) 112 100 (!) 105   Resp: 18 19 20    Temp:    98 F  (36.7 C)  TempSrc:    Axillary  SpO2: 96% 95% 99%   Weight:      Height:        Intake/Output Summary (Last 24 hours) at 07/07/17 0918 Last data filed at 07/07/17 0912  Gross per 24 hour  Intake           1043.8 ml  Output             1030 ml  Net             13.8 ml   Filed Weights   07/02/17 0400 07/03/17 0500 07/03/17 1500  Weight: 93.5 kg (206 lb 2.1 oz) 93.8 kg (206 lb 12.7 oz) 92.3 kg (203 lb 7.8 oz)    General appearance: Awake, alert. Answers a few questions but not consistently. Resp: Diminished air entry at the bases but clear to auscultation otherwise Cardio: S1, S2 is irregularly irregular. No S3, S4. No rubs, murmurs, or bruit. Heart rate appears to be better controlled. GI: Abdomen is soft. Nontender, nondistended. Bowel sounds are present. No masses, organomegaly Extremities: No edema Neurologic: Poor effort. No focal neurological deficits noted.  Lab Results:  Data Reviewed: I have personally reviewed following labs and imaging studies  CBC:  Recent Labs Lab 07/03/17 0400 07/04/17 0211 07/05/17 0540 07/06/17 0424 07/07/17 0335  WBC 15.1* 15.2* 13.6* 13.3* 13.9*  HGB 10.7* 10.6* 10.5* 11.0* 11.2*  HCT 32.2* 32.4* 32.1* 33.3* 33.5*  MCV 91.2 90.8 90.7 89.5 89.1  PLT 346 314 307 330 332    Basic Metabolic Panel:  Recent Labs Lab 07/01/17 0516 07/02/17 0450 07/03/17 0400 07/04/17 0211 07/06/17 0424 07/07/17 0335  NA 147* 147* 149* 143 133* 136  K 2.8* 3.4* 3.6 3.1* 3.4* 3.3*  CL 116*  116* 117* 112* 103 102  CO2 20* 21* 23 23 23 25   GLUCOSE 172* 160* 170* 162* 146* 135*  BUN 27* 21* 17 12 10 9   CREATININE 1.18* 0.97 0.91 0.82 0.72 0.77  CALCIUM 8.5* 8.6* 8.7* 8.4* 8.3* 8.2*  MG 1.1* 1.8  --   --  1.2*  --     GFR: Estimated Creatinine Clearance: 69.2 mL/min (by C-G formula based on SCr of 0.77 mg/dL).  Liver Function Tests:  Recent Labs Lab 07/04/17 0211  AST 41  ALT 49  ALKPHOS 69  BILITOT 0.4  PROT 4.9*  ALBUMIN 2.4*      Recent Labs Lab 06/30/17 0948  AMMONIA 11    Cardiac Enzymes:  Recent Labs Lab 06/30/17 0948  TROPONINI 0.04*    CBG:  Recent Labs Lab 07/06/17 1602 07/06/17 1926 07/06/17 2314 07/07/17 0324 07/07/17 0801  GLUCAP 153* 147* 145* 130* 122*     Recent Results (from the past 240 hour(s))  Blood Culture (routine x 2)     Status: None   Collection Time: 06/29/17  1:35 PM  Result Value Ref Range Status   Specimen Description BLOOD RIGHT WRIST  Final   Special Requests   Final    BOTTLES DRAWN AEROBIC AND ANAEROBIC Blood Culture results may not be optimal due to an inadequate volume of blood received in culture bottles   Culture NO GROWTH 5 DAYS  Final   Report Status 07/04/2017 FINAL  Final  Urine culture     Status: Abnormal   Collection Time: 06/29/17  1:42 PM  Result Value Ref Range Status   Specimen Description URINE, CLEAN CATCH  Final   Special Requests NONE  Final   Culture MULTIPLE SPECIES PRESENT, SUGGEST RECOLLECTION (A)  Final   Report Status 07/01/2017 FINAL  Final  Culture, blood (Routine X 2) w Reflex to ID Panel     Status: None   Collection Time: 06/29/17  3:55 PM  Result Value Ref Range Status   Specimen Description BLOOD LEFT HAND  Final   Special Requests   Final    Blood Culture results may not be optimal due to an inadequate volume of blood received in culture bottles   Culture NO GROWTH 5 DAYS  Final   Report Status 07/04/2017 FINAL  Final  MRSA PCR Screening     Status: None   Collection Time: 06/29/17 10:05 PM  Result Value Ref Range Status   MRSA by PCR NEGATIVE NEGATIVE Final    Comment:        The GeneXpert MRSA Assay (FDA approved for NASAL specimens only), is one component of a comprehensive MRSA colonization surveillance program. It is not intended to diagnose MRSA infection nor to guide or monitor treatment for MRSA infections.   MRSA PCR Screening     Status: None   Collection Time: 07/03/17  2:29 PM  Result Value  Ref Range Status   MRSA by PCR NEGATIVE NEGATIVE Final    Comment:        The GeneXpert MRSA Assay (FDA approved for NASAL specimens only), is one component of a comprehensive MRSA colonization surveillance program. It is not intended to diagnose MRSA infection nor to guide or monitor treatment for MRSA infections.   C difficile quick scan w PCR reflex     Status: Abnormal   Collection Time: 07/03/17  5:27 PM  Result Value Ref Range Status   C Diff antigen POSITIVE (A) NEGATIVE Final   C Diff toxin  NEGATIVE NEGATIVE Final   C Diff interpretation Results are indeterminate. See PCR results.  Final  Clostridium Difficile by PCR     Status: Abnormal   Collection Time: 07/03/17  5:27 PM  Result Value Ref Range Status   Toxigenic C Difficile by pcr POSITIVE (A) NEGATIVE Final    Comment: Positive for toxigenic C. difficile with little to no toxin production. Only treat if clinical presentation suggests symptomatic illness.      Radiology Studies: No results found.   Medications:  Scheduled: . chlorhexidine  15 mL Mouth Rinse BID  . insulin aspart  0-9 Units Subcutaneous Q4H  . mouth rinse  15 mL Mouth Rinse q12n4p  . metoprolol tartrate  5 mg Intravenous Q4H  . nystatin   Topical TID  . sodium chloride flush  10-40 mL Intracatheter Q12H  . vancomycin  500 mg Oral Q6H   Continuous: . amiodarone 30 mg/hr (07/07/17 0519)  . dextrose 5 % with KCl 20 mEq / L 20 mEq (07/06/17 1012)  . heparin 900 Units/hr (07/06/17 2039)  . metronidazole Stopped (07/07/17 0422)  . potassium chloride     ZOX:[id number], acetaminophen, LORazepam, ondansetron (ZOFRAN) IV, sodium chloride flush  Assessment/Plan:  Principal Problem:   Acute encephalopathy Active Problems:   Essential hypertension   Diabetes mellitus type 2 in obese (HCC)   Atrial fibrillation with RVR (HCC)   Acute renal failure superimposed on stage 3 chronic kidney disease (HCC)   Urinary tract infection without  hematuria   Palliative care encounter   Goals of care, counseling/discussion    Acute metabolic encephalopathy/tremor/rhythmic movements Etiology thought to be multifactorial including infection in the form of UTI, hyponatremia, C. difficile, acute renal failure and atrial fibrillation. Patient has been very slow to improve. Patient underwent imaging studies in the form of CT scan and MRI brain which have not revealed any clear cut reason. Patient also underwent EEG which did not show any epileptiform activity. Patient has been seen by neurology. Vitamin B-12 and ammonia levels were normal. No further testing is thought to be necessary. Palliative medicine was consulted. Patient is not swallowing tablets. Modified barium swallow is to be done today.  Atrial fibrillation with RVR/elevated troponin/demand ischemia Chads2vasc score is 5. Patient is being seen by cardiology. Patient is on amiodarone infusion. She had previously been on Cardizem. Patient is also on IV metoprolol. On IV heparin. Was previously on oral anticoagulants (Eliquis).  C. difficile colitis. Stable. Continues to have diarrhea. Continue intravenous Flagyl and oral vancomycin. Unfortunately, she has not been able to swallow her tablets. If diarrhea does not slow down and if she continues to have significant dysphagia, may have to use vancomycin enemas.  Acute on chronic kidney disease stage III. Renal function close to baseline.  Urinary tract infection. No growth noted on urine cultures. Antibiotics were discontinued.  Hypernatremia/hypokalemia Resolved with IV fluids. Replete potassium  Essential hypertension. Stable.  Diabetes mellitus type 2. Kidney to her metformin. Last HbA1c was 6.6. Continue SSI.  DVT Prophylaxis: On IV heparin    Code Status: DO NOT RESUSCITATE Family Communication: Discussed with sister at bedside Disposition Plan: Management as outlined above.    LOS: 8 days   [facility]  [facility] Pager [phone] 07/07/2017, 9:18 AM  If 7PM-7AM, please contact night-coverage at [email or web address], password [facility]

## 2017-07-07 NOTE — Evaluation (Signed)
Physical Therapy Evaluation Patient Details Name: Deborah Benjamin MRN: [id number] DOB: [birth year] Today's Date: 07/07/2017   History of Present Illness  [facility] with acute encephalopathy and afib with rvr. Pt also found to have cdiff. PMH - HTN, DM, vertigo  Clinical Impression  Pt admitted with above diagnosis and presents to PT with functional limitations due to deficits listed below (See PT problem list). Pt needs skilled PT to maximize independence and safety to allow discharge to SNF. Currently pt dependent for all mobility and lives by herself. Needs further therapy prior to return home.     Follow Up Recommendations SNF    Equipment Recommendations  Other (comment) (To be assessed)    Recommendations for Other Services       Precautions / Restrictions Precautions Precautions: Fall Restrictions Weight Bearing Restrictions: No      Mobility  Bed Mobility Overal bed mobility: Needs Assistance Bed Mobility: Rolling;Supine to Sit Rolling: Total assist   Supine to sit: Total assist     General bed mobility comments: Assist for all aspects. Assisted pt from supine to long sitting  Transfers                 General transfer comment: Didn't attempt. Currently will need mechanical lift.  Ambulation/Gait                Stairs            Wheelchair Mobility    Modified Rankin (Stroke Patients Only)       Balance                                             Pertinent Vitals/Pain Pain Assessment: Faces Faces Pain Scale: No hurt    Home Living Family/patient expects to be discharged to:: Private residence Living Arrangements: Alone Available Help at Discharge: Family;Available PRN/intermittently Type of Home: House         Home Equipment: Walker - 2 wheels;Cane - single point Additional Comments: Per notes pt doesn't use assistive device in the house. Per notes sister reports house is not clean     Prior Function Level of Independence: Independent with assistive device(s)               Hand Dominance        Extremity/Trunk Assessment   Upper Extremity Assessment Upper Extremity Assessment: Defer to OT evaluation    Lower Extremity Assessment Lower Extremity Assessment: Generalized weakness;RLE deficits/detail;LLE deficits/detail RLE Deficits / Details: Limited assessment due to cognition. Pt did assist slightly with basic AAROM LLE Deficits / Details: Limited assessment due to cognition. Pt did assist slightly with basic AAROM       Communication   Communication: No difficulties  Cognition Arousal/Alertness: Lethargic Behavior During Therapy: Flat affect Overall Cognitive Status: Impaired/Different from baseline Area of Impairment: Orientation;Attention;Memory;Following commands;Safety/judgement;Problem solving                 Orientation Level: Disoriented to;Time;Situation Current Attention Level: Sustained Memory: Decreased recall of precautions;Decreased short-term memory Following Commands: Follows one step commands inconsistently;Follows one step commands with increased time Safety/Judgement: Decreased awareness of safety;Decreased awareness of deficits   Problem Solving: Slow processing;Decreased initiation;Difficulty sequencing;Requires verbal cues;Requires tactile cues General Comments: Pt able to maintain arousal with verbal/tactile stimulation.      General Comments      Exercises  Assessment/Plan    PT Assessment Patient needs continued PT services  PT Problem List Decreased strength;Decreased activity tolerance;Decreased balance;Decreased mobility;Decreased knowledge of use of DME;Obesity       PT Treatment Interventions DME instruction;Gait training;Functional mobility training;Therapeutic activities;Therapeutic exercise;Balance training;Patient/family education;Cognitive remediation    PT Goals (Current goals can be found  in the Care Plan section)  Acute Rehab PT Goals Patient Stated Goal: Pt didn't state PT Goal Formulation: With patient Time For Goal Achievement: 07/21/17 Potential to Achieve Goals: Fair    Frequency Min 3X/week   Barriers to discharge Decreased caregiver support Lives alone    Co-evaluation               AM-PAC PT "6 Clicks" Daily Activity  Outcome Measure Difficulty turning over in bed (including adjusting bedclothes, sheets and blankets)?: Total Difficulty moving from lying on back to sitting on the side of the bed? : Total Difficulty sitting down on and standing up from a chair with arms (e.g., wheelchair, bedside commode, etc,.)?: Total Help needed moving to and from a bed to chair (including a wheelchair)?: Total Help needed walking in hospital room?: Total Help needed climbing 3-5 steps with a railing? : Total 6 Click Score: 6    End of Session   Activity Tolerance: Patient limited by lethargy Patient left: in bed;with call bell/phone within reach;Other (comment) (transporter and nurse going in to take to [facility])   PT Visit Diagnosis: Muscle weakness (generalized) (M62.81);Difficulty in walking, not elsewhere classified (R26.2)    Time: 1610-9604 PT Time Calculation (min) (ACUTE ONLY): 14 min   Charges:   PT Evaluation $PT Eval Moderate Complexity: 1 Procedure     PT G CodesMarland Kitchen        [facility] PT [phone]   Angelina Ok [facility] 07/07/2017, 2:02 PM

## 2017-07-07 NOTE — Progress Notes (Addendum)
Nutrition Follow Up  Obesity unspecified  INTERVENTION:    Await Speech Path recommendations   If short-term TF warranted, rec Cortrak small bore tube placement  NUTRITION DIAGNOSIS:   Inadequate oral intake related to inability to eat, lethargy/confusion as evidenced by NPO status, ongoing  GOAL:   Patient will meet greater than or equal to 90% of their needs, not met  MONITOR:   Diet advancement, PO intake, Labs, Weights, I/O's  ASSESSMENT:   76 y.o. Female with medical history significant of HTN, HLD, DM, vertigo and recent (6/4-5) admission for new-onset afib and hyponatremia presenting with altered mental status.  Pt transferred from [place] lethargic and confused. S/p bedside swallow evaluation 7/11. SLP rec NPO status. ? MBSS today. PCT note reviewed. Family ok with short term feeding, however, no PEG tube desired. Labs and medications reviewed. K 3.3 (L). CBG's 145-130-122.  Diet Order:  Diet NPO time specified  Skin:   (MASD)  Last BM:  7/11  Height:   Ht Readings from Last 1 Encounters:  07/03/17 _0  (1.651 m)    Weight: >> stable  Wt Readings from Last 1 Encounters:  07/03/17 203 lb 7.8 oz (92.3 kg)    Ideal Body Weight:  59 kg  BMI:  Body mass index is 33.86 kg/m.  Estimated Nutritional Needs:   Kcal:  1700-1900  Protein:  90-105 gm  Fluid:  1.7-1.9 L  EDUCATION NEEDS:   No education needs identified at this time  Arthur Holms, RD, LDN Pager #: [phone] After-Hours Pager #: [phone]

## 2017-07-07 NOTE — Progress Notes (Signed)
  Speech Language Pathology  Patient Details Name: Deborah Benjamin MRN: [id number] DOB: 10-Nov-1941 Today's Date: 07/07/2017 Time:  -     MBS scheduled for 1330 today.                [name], [name] 07/07/2017, 11:51 AM  [name] M.Ed [employer] Pager [phone]

## 2017-07-07 NOTE — Progress Notes (Signed)
Daily Progress Note   Patient Name: Deborah Benjamin       Date: 07/07/2017 DOB: 11-27-1941  Age: 76 y.o. MRN#: [id number] Attending Physician: Bonnielee Haff, MD Primary Care Physician: Jani Gravel, MD Admit Date: 06/29/2017  Reason for Consultation/Follow-up: Establishing goals of care  Subjective: Deborah Benjamin is much more alert today! Eyes are open and she is more verbal (still confused but was able to tell me her dog's name Burt Knack) but is still delayed responses.   Length of Stay: 8  Current Medications: Scheduled Meds:  . chlorhexidine  15 mL Mouth Rinse BID  . insulin aspart  0-9 Units Subcutaneous Q4H  . mouth rinse  15 mL Mouth Rinse q12n4p  . metoprolol tartrate  5 mg Intravenous Q4H  . nystatin   Topical TID  . sodium chloride flush  10-40 mL Intracatheter Q12H  . vancomycin  500 mg Oral Q6H    Continuous Infusions: . amiodarone 30 mg/hr (07/07/17 0519)  . dextrose 5 % with KCl 20 mEq / L 20 mEq (07/06/17 1012)  . heparin 900 Units/hr (07/06/17 2039)  . metronidazole Stopped (07/07/17 1120)  . potassium chloride 10 mEq (07/07/17 1342)    PRN Meds: acetaminophen, acetaminophen, LORazepam, ondansetron (ZOFRAN) IV, sodium chloride flush  Physical Exam  Constitutional: She appears well-developed. She appears lethargic. She appears ill.  Obese   HENT:  Head: Normocephalic and atraumatic.  Cardiovascular: An irregularly irregular rhythm present. Tachycardia present.   Pulmonary/Chest: Effort normal. No accessory muscle usage. No tachypnea. No respiratory distress.  Abdominal: Soft. Normal appearance.  Neurological: She appears lethargic.  When she answers questions the answers are appropriate  Nursing note and vitals reviewed.           Vital Signs: BP 121/82 (BP  Location: Right Wrist)   Pulse (!) 105   Temp 98 F (36.7 C) (Axillary)   Resp 20   Ht _0  (1.651 m)   Wt 92.3 kg (203 lb 7.8 oz)   SpO2 99%   BMI 33.86 kg/m  SpO2: SpO2: 99 % O2 Device: O2 Device: Nasal Cannula O2 Flow Rate: O2 Flow Rate (L/min): 2 L/min  Intake/output summary:   Intake/Output Summary (Last 24 hours) at 07/07/17 1355 Last data filed at 07/07/17 0912  Gross per 24 hour  Intake  829.76 ml  Output             1030 ml  Net          -200.24 ml   LBM: Last BM Date: 07/06/17 Baseline Weight: Weight: 99.8 kg (220 lb) Most recent weight: Weight: 92.3 kg (203 lb 7.8 oz)       Palliative Assessment/Data: 30%    Flowsheet Rows     Most Recent Value  Intake Tab  Referral Department  Hospitalist  Unit at Time of Referral  ICU  Palliative Care Primary Diagnosis  Cardiac  Date Notified  07/01/17  Palliative Care Type  New Palliative care  Reason for referral  Clarify Goals of Care, Psychosocial or Spiritual support  Date of Admission  06/29/17  Date first seen by Palliative Care  07/01/17  # of days Palliative referral response time  0 Day(s)  # of days IP prior to Palliative referral  2  Clinical Assessment  Pain Max last 24 hours  Not able to report  Pain Min Last 24 hours  Not able to report  Dyspnea Max Last 24 Hours  Not able to report  Dyspnea Min Last 24 hours  Not able to report  Psychosocial & Spiritual Assessment  Palliative Care Outcomes  Patient/Family meeting held?  No [Patient unable to participate, left voicemail for sister]  Patient/Family wishes: Interventions discontinued/not started   Mechanical Ventilation      Patient Active Problem List   Diagnosis Date Noted  . Urinary tract infection without hematuria   . Palliative care encounter   . Goals of care, counseling/discussion   . Atrial fibrillation with RVR ([place]) 06/30/2017  . Acute renal failure superimposed on stage 3 chronic kidney disease ([place]) 06/30/2017  . Acute  encephalopathy 06/29/2017  . Sepsis ([place]) 06/29/2017  . AKI (acute kidney injury) ([place]) 06/29/2017  . Hyponatremia 05/30/2017  . Atrial fibrillation and flutter ([place]) 05/30/2017  . Essential hypertension 05/30/2017  . Vertigo 05/30/2017  . Diabetes mellitus type 2 in obese ([place]) 05/30/2017  . Hyperlipidemia 05/30/2017    Palliative Care Assessment & Plan   HPI: 76 y.o. female  with past medical history of diabetes without complications, high blood pressure and high cholesterol, atrial fibrillation, history of gallbladder surgery and abdominal hysterectomy admitted on 06/29/2017 with acute encephalopathy possibly related to sepsis. Treated for UTI but remains very lethargic with encephalopathy.    Assessment: I met today with Deborah Benjamin and sister, Deborah Benjamin, again. Deborah Benjamin is very pleased with her sister's progress today and we are hopeful that she passes swallow eval now she is much more alert. Discussed with Deborah Benjamin that I am unsure what her new baseline will be and that I do not know if she will ever be able to return back to her home. Deborah Benjamin understands. Deborah Benjamin even says that a rehab/SNF is better than hospice - she is well aware of how severely ill her sister has been. Continues to be okay with TEMPORARY FEEDING TUBE since Deborah Benjamin is improving but NO PEG/LONG TERM.   Recommendations/Plan:  Continue to treat C. diff.   No pain/discomfort.  Avoid sedating medications.   Code Status:  DNR  (she says they filled out "paperwork" together but cannot currently find)  Prognosis:   Unable to determine. Improving.   Discharge Planning:  To Be Determined. Will need SNF rehab with severe weakness.   Thank you for allowing the Palliative Medicine Team to assist in the care of this patient.   Total  Time 53mn Prolonged Time Billed  no       Greater than 50%  of this time was spent counseling and coordinating care related to the above assessment and plan.  AVinie Sill NP Palliative  Medicine Team Pager # [phone](M-F 8a-5p) Team Phone # [phone](Nights/Weekends)

## 2017-07-08 DIAGNOSIS — R748 Abnormal levels of other serum enzymes: Secondary | ICD-10-CM

## 2017-07-08 LAB — CBC
HCT: 33.8 % — ABNORMAL LOW (ref 36.0–46.0)
HEMOGLOBIN: 11.4 g/dL — AB (ref 12.0–15.0)
MCH: 29.9 pg (ref 26.0–34.0)
MCHC: 33.7 g/dL (ref 30.0–36.0)
MCV: 88.7 fL (ref 78.0–100.0)
Platelets: 347 10*3/uL (ref 150–400)
RBC: 3.81 MIL/uL — AB (ref 3.87–5.11)
RDW: 13.5 % (ref 11.5–15.5)
WBC: 14 10*3/uL — ABNORMAL HIGH (ref 4.0–10.5)

## 2017-07-08 LAB — GLUCOSE, CAPILLARY
GLUCOSE-CAPILLARY: 133 mg/dL — AB (ref 65–99)
GLUCOSE-CAPILLARY: 135 mg/dL — AB (ref 65–99)
GLUCOSE-CAPILLARY: 115 mg/dL — AB (ref 65–99)
Glucose-Capillary: 117 mg/dL — ABNORMAL HIGH (ref 65–99)
Glucose-Capillary: 132 mg/dL — ABNORMAL HIGH (ref 65–99)
Glucose-Capillary: 136 mg/dL — ABNORMAL HIGH (ref 65–99)

## 2017-07-08 LAB — BASIC METABOLIC PANEL
Anion gap: 8 (ref 5–15)
BUN: 8 mg/dL (ref 6–20)
CHLORIDE: 100 mmol/L — AB (ref 101–111)
CO2: 24 mmol/L (ref 22–32)
Calcium: 8.2 mg/dL — ABNORMAL LOW (ref 8.9–10.3)
Creatinine, Ser: 0.76 mg/dL (ref 0.44–1.00)
GFR calc non Af Amer: >60 mL/min (ref >60)
Glucose, Bld: 140 mg/dL — ABNORMAL HIGH (ref 65–99)
POTASSIUM: 3.4 mmol/L — AB (ref 3.5–5.1)
SODIUM: 132 mmol/L — AB (ref 135–145)

## 2017-07-08 LAB — HEPARIN LEVEL (UNFRACTIONATED): Heparin Unfractionated: 0.39 IU/mL (ref 0.30–0.70)

## 2017-07-08 LAB — MAGNESIUM: MAGNESIUM: 1.2 mg/dL — AB (ref 1.7–2.4)

## 2017-07-08 MED ORDER — MAGNESIUM SULFATE 2 GM/50ML IV SOLN
2.0000 g | Freq: Once | INTRAVENOUS | Status: AC
  Administered 2017-07-08: 2 g via INTRAVENOUS
  Filled 2017-07-08: qty 50

## 2017-07-08 MED ORDER — AMIODARONE HCL 200 MG PO TABS
200.0000 mg | ORAL_TABLET | Freq: Two times a day (BID) | ORAL | Status: DC
  Administered 2017-07-08 – 2017-07-11 (×6): 200 mg via ORAL
  Filled 2017-07-08 (×7): qty 1

## 2017-07-08 MED ORDER — APIXABAN 5 MG PO TABS
5.0000 mg | ORAL_TABLET | Freq: Two times a day (BID) | ORAL | Status: DC
  Administered 2017-07-08 – 2017-07-11 (×6): 5 mg via ORAL
  Filled 2017-07-08 (×7): qty 1

## 2017-07-08 MED ORDER — METOPROLOL TARTRATE 25 MG PO TABS
25.0000 mg | ORAL_TABLET | Freq: Two times a day (BID) | ORAL | Status: DC
  Administered 2017-07-08 – 2017-07-09 (×3): 25 mg via ORAL
  Filled 2017-07-08 (×3): qty 1

## 2017-07-08 NOTE — Progress Notes (Signed)
  Speech Language Pathology Treatment: Dysphagia  Patient Details Name: Deborah Benjamin MRN: [id number] DOB: 03-15-1941 Today's Date: 07/08/2017 Time: [id number] SLP Time Calculation (min) (ACUTE ONLY): 13 min  Assessment / Plan / Recommendation Clinical Impression  Pt needed frequent max cues with minimal distractions to attend to SLP and po consumption. Moved arm spontaneously x 1; attempted hand over hand with SLP however unable to reach oral cavity even with elbow support and required total assist with spoon. Stated she disliked the taste of the water and scowled following pudding; suspect altered taste. Oral transit delays and suspected swallow initiation delays. Minimal intake of one straw sip and 2 bites pudding. Continue to encourage intake. She is unable to upgrade from puree due to decreased oral manipulation.    HPI HPI: Deborah RilesFannie L Dameronis a 76 y.o.femalewith medical history significant of HTN, HLD, DM, vertigo and recent (6/4-5) admission for new-onset afib and hyponatremia presenting with altered mental status. 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 Diagnosed with acute metabolic encephalopathy, A-fib, acute on chronic renal failure, sepsis (urine source vs possible aspiration). CXR 7/5 aortic atherosclerosis. No edema or consolidation.      SLP Plan  Continue with current plan of care       Recommendations  Diet recommendations: Dysphagia 1 (puree);Thin liquid Liquids provided via: Straw;Cup Medication Administration: Crushed with puree Supervision: Staff to assist with self feeding;Full supervision/cueing for compensatory strategies Compensations: Slow rate;Small sips/bites;Minimize environmental distractions Postural Changes and/or Swallow Maneuvers: Seated upright 90 degrees                Oral Care Recommendations: Oral care QID Follow up Recommendations: Skilled Nursing  facility SLP Visit Diagnosis: Dysphagia, pharyngeal phase (R13.13) Plan: Continue with current plan of care       GO                Deborah MacadamiaLitaker, Deborah Benjamin 07/08/2017, 2:53 PM  Deborah CoonsLisa Benjamin Berneta Benjamin M.Ed [employer] Pager [phone]

## 2017-07-08 NOTE — Progress Notes (Addendum)
Progress Note  Patient Name: Deborah Benjamin Date of Encounter: 07/08/2017  Primary Cardiologist: Dr. Percell Belt  Subjective   Remains lethargic and will not answer questions except for shaking her head.  She does not open her eyes.  HR much improved and now in the 90's.  Per her nurse, she is cleared to take pills.  Inpatient Medications    Scheduled Meds: . chlorhexidine  15 mL Mouth Rinse BID  . insulin aspart  0-9 Units Subcutaneous Q4H  . mouth rinse  15 mL Mouth Rinse q12n4p  . metoprolol tartrate  5 mg Intravenous Q4H  . nystatin   Topical TID  . sodium chloride flush  10-40 mL Intracatheter Q12H  . vancomycin  500 mg Oral Q6H   Continuous Infusions: . amiodarone 30 mg/hr (07/08/17 0319)  . dextrose 5 % with KCl 20 mEq / L 20 mEq (07/06/17 1012)  . heparin 900 Units/hr (07/06/17 2039)  . metronidazole Stopped (07/08/17 0419)   PRN Meds: acetaminophen, acetaminophen, LORazepam, ondansetron (ZOFRAN) IV, sodium chloride flush   Vital Signs    Vitals:   07/08/17 0400 07/08/17 0500 07/08/17 0600 07/08/17 0731  BP: (!) 134/96 (!) 139/92 137/87 (!) 115/56  Pulse: 82 86 98 92  Resp: 20 15 19    Temp:    98 F (36.7 C)  TempSrc:    Oral  SpO2: 98% 98% 98% 99%  Weight:      Height:        Intake/Output Summary (Last 24 hours) at 07/08/17 0853 Last data filed at 07/08/17 0600  Gross per 24 hour  Intake           1040.4 ml  Output              950 ml  Net             90.4 ml   Filed Weights   07/02/17 0400 07/03/17 0500 07/03/17 1500  Weight: 206 lb 2.1 oz (93.5 kg) 206 lb 12.7 oz (93.8 kg) 203 lb 7.8 oz (92.3 kg)    Telemetry    Atrial fibrillation with CVR - Personally Reviewed  ECG    No new EKG to review - Personally Reviewed  Physical Exam   GEN: lethargic, WD WN Neck: no JVD Cardiac: irregularly irregular with no M/R/G Respiratory: CTA bilaterally GI: soft, NT, ND MS: no edema Neuro: lethargic Psych: cannot assess   Labs     Chemistry  Recent Labs Lab 07/04/17 0211 07/06/17 0424 07/07/17 0335 07/08/17 0317  NA 143 133* 136 132*  K 3.1* 3.4* 3.3* 3.4*  CL 112* 103 102 100*  CO2 23 23 25 24   GLUCOSE 162* 146* 135* 140*  BUN 12 10 9 8   CREATININE 0.82 0.72 0.77 0.76  CALCIUM 8.4* 8.3* 8.2* 8.2*  PROT 4.9*  --   --   --   ALBUMIN 2.4*  --   --   --   AST 41  --   --   --   ALT 49  --   --   --   ALKPHOS 69  --   --   --   BILITOT 0.4  --   --   --   GFRNONAA >60 >60 >60 >60  GFRAA >60 >60 >60 >60  ANIONGAP 8 7 9 8      Hematology  Recent Labs Lab 07/06/17 0424 07/07/17 0335 07/08/17 0317  WBC 13.3* 13.9* 14.0*  RBC 3.72* 3.76* 3.81*  HGB 11.0* 11.2*  11.4*  HCT 33.3* 33.5* 33.8*  MCV 89.5 89.1 88.7  MCH 29.6 29.8 29.9  MCHC 33.0 33.4 33.7  RDW 13.4 13.4 13.5  PLT 330 332 347    Cardiac EnzymesNo results for input(s): TROPONINI in the last 168 hours. No results for input(s): TROPIPOC in the last 168 hours.   BNPNo results for input(s): BNP, PROBNP in the last 168 hours.   DDimer No results for input(s): DDIMER in the last 168 hours.   Radiology    Dg Swallowing Func-speech Pathology  Result Date: 07/07/2017 Objective Swallowing Evaluation: Type of Study: MBS-Modified Barium Swallow Study Patient Details Name: Meridee ScoreFannie L Porreca MRN: [id number] Date of Birth: 1941/07/05 Today's Date: 07/07/2017 Time: SLP Start Time (ACUTE ONLY): 1330-SLP Stop Time (ACUTE ONLY): 1350 SLP Time Calculation (min) (ACUTE ONLY): 20 min Past Medical History: Past Medical History: Diagnosis Date . Atrial fibrillation (HCC)  . Diabetes mellitus without complication (HCC)  . High cholesterol  . Hypertension  . Vertigo  Past Surgical History: Past Surgical History: Procedure Laterality Date . ABDOMINAL HYSTERECTOMY   . CHOLECYSTECTOMY   . CYST EXCISION   HPI: Modestine L Dameronis a 76 y.o.femalewith medical history significant of HTN, HLD, DM, vertigo and recent (6/4-5) admission for new-onset afib and hyponatremia  presenting with altered mental status. 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 Diagnosed with acute metabolic encephalopathy, A-fib, acute on chronic renal failure, sepsis (urine source vs possible aspiration). CXR 7/5 aortic atherosclerosis. No edema or consolidation. No Data Recorded Assessment / Plan / Recommendation CHL IP CLINICAL IMPRESSIONS 07/07/2017 Clinical Impression Pt mildly drowsy during MBS however alert enough for MBS. Oral holding and delayed transit likely due to cognitive impairments versus a true oral dysphagia. Holding boluses with delayed oral transit and unable to effecitvely masticate solid texture which was expectorated. One episode of flash penetration with thin with multiple cup and straw sips. Mild vallecular and pyriform sinus residue cleared with cues for second swallow. Recommend thin liquids, and puree (D1) and thin liquids, small sips straw allowed, crush meds, full supervision, sit upright and eat when alert.  SLP Visit Diagnosis Dysphagia, pharyngeal phase (R13.13) Attention and concentration deficit following -- Frontal lobe and executive function deficit following -- Impact on safety and function Mild aspiration risk   CHL IP TREATMENT RECOMMENDATION 07/07/2017 Treatment Recommendations Therapy as outlined in treatment plan below   Prognosis 07/07/2017 Prognosis for Safe Diet Advancement Good Barriers to Reach Goals -- Barriers/Prognosis Comment -- CHL IP DIET RECOMMENDATION 07/07/2017 SLP Diet Recommendations Dysphagia 1 (Puree) solids;Thin liquid Liquid Administration via Cup;Straw Medication Administration Crushed with puree Compensations Slow rate;Minimize environmental distractions;Small sips/bites Postural Changes Seated upright at 90 degrees   CHL IP OTHER RECOMMENDATIONS 07/07/2017 Recommended Consults -- Oral Care Recommendations Oral care BID Other Recommendations --   CHL IP FOLLOW UP  RECOMMENDATIONS 07/07/2017 Follow up Recommendations (No Data)   CHL IP FREQUENCY AND DURATION 07/07/2017 Speech Therapy Frequency (ACUTE ONLY) min 2x/week Treatment Duration 2 weeks      CHL IP ORAL PHASE 07/07/2017 Oral Phase Impaired Oral - Pudding Teaspoon -- Oral - Pudding Cup -- Oral - Honey Teaspoon -- Oral - Honey Cup -- Oral - Nectar Teaspoon -- Oral - Nectar Cup Delayed oral transit;Holding of bolus Oral - Nectar Straw -- Oral - Thin Teaspoon -- Oral - Thin Cup Holding of bolus;Delayed oral transit Oral - Thin Straw -- Oral - Puree -- Oral -  Mech Soft -- Oral - Regular Impaired mastication Oral - Multi-Consistency Delayed oral transit Oral - Pill -- Oral Phase - Comment --  CHL IP PHARYNGEAL PHASE 07/07/2017 Pharyngeal Phase Impaired Pharyngeal- Pudding Teaspoon -- Pharyngeal -- Pharyngeal- Pudding Cup -- Pharyngeal -- Pharyngeal- Honey Teaspoon -- Pharyngeal -- Pharyngeal- Honey Cup -- Pharyngeal -- Pharyngeal- Nectar Teaspoon -- Pharyngeal -- Pharyngeal- Nectar Cup Pharyngeal residue - valleculae;Pharyngeal residue - pyriform;Reduced epiglottic inversion;Reduced laryngeal elevation Pharyngeal -- Pharyngeal- Nectar Straw -- Pharyngeal -- Pharyngeal- Thin Teaspoon -- Pharyngeal -- Pharyngeal- Thin Cup Pharyngeal residue - valleculae;Pharyngeal residue - pyriform;Reduced laryngeal elevation;Reduced epiglottic inversion Pharyngeal -- Pharyngeal- Thin Straw Pharyngeal residue - valleculae Pharyngeal -- Pharyngeal- Puree -- Pharyngeal -- Pharyngeal- Mechanical Soft -- Pharyngeal -- Pharyngeal- Regular (No Data) Pharyngeal -- Pharyngeal- Multi-consistency Pharyngeal residue - valleculae;Reduced epiglottic inversion Pharyngeal -- Pharyngeal- Pill -- Pharyngeal -- Pharyngeal Comment --  CHL IP CERVICAL ESOPHAGEAL PHASE 07/07/2017 Cervical Esophageal Phase WFL Pudding Teaspoon -- Pudding Cup -- Honey Teaspoon -- Honey Cup -- Nectar Teaspoon -- Nectar Cup -- Nectar Straw -- Thin Teaspoon -- Thin Cup -- Thin Straw --  Puree -- Mechanical Soft -- Regular -- Multi-consistency -- Pill -- Cervical Esophageal Comment -- No flowsheet data found. Royce Macadamia 07/07/2017, 3:36 PM      [name] [name].Ed CCC-SLP Pager [phone]          Cardiac Studies   2D echo 05/2017 Study Conclusions  - Left ventricle: The cavity size was normal. Wall thickness was at   the upper limits of normal. Systolic function was normal. The   estimated ejection fraction was in the range of 60% to 65%. Wall   motion was normal; there were no regional wall motion   abnormalities. The study is not technically sufficient to allow   evaluation of LV diastolic function. - Aortic valve: Mildly calcified annulus. Trileaflet. - Mitral valve: Calcified annulus. There was mild regurgitation. - Left atrium: The atrium was mildly to moderately dilated. - Right atrium: Central venous pressure (est): 8 mm Hg. - Atrial septum: No defect or patent foramen ovale was identified. - Tricuspid valve: There was trivial regurgitation. - Pulmonary arteries: PA peak pressure: 30 mm Hg (S). - Pericardium, extracardiac: There was no pericardial effusion.  Impressions:  - Upper normal LV wall thickness with LVEF 60-65%. Indeterminate   diastolic function. Mild to moderate left atrial enlargement.   Mildly calcified mitral annulus with mild mitral regurgitation.   Mildly sclerotic aortic valve. Trivial tricuspid regurgitation   with PASP estimated 30 mmHg.   Patient Profile     77 y.o. female with a hx of atrial fib with hospitalization 05/30/17 through 05/31/2017 and hx of HTN, HLD, DM, hypomagnesium, seen 06/30/17 with hospitalization for a fib RVR and urosepsis, and has been transferred to [facility] for acute metabolic encephalopathy who is being seen by Cardiology for evaluation of atrial fib at the request of Dr. Izola Price.  Assessment & Plan    1. Persistent a fib  - HR much improved after increasing lopressor to 5mg  IV every 6 hours and IV Amio  gtt. - Cardizem gtt weaned off - Patient has been cleared to take PO so will stop Heparin and start Eliquis 5mg  BID PO. - Stop IV Amio and change to 200mg  PO BID - Change IV lopressor to Lopressor 25mg  BID PO - 2D echo last month showed normal EF.    2.  AMS due to metabolic encephalopathy  - still confused slow to improve CT  and MRI brain neg.    3.   Acute on chronic renal failure - creatinine now improved (1.64>>0.82>>0.72>>0.77>>0.76)  4.   DM-2 - per IM  5.  Elevated troponin - very mild elevation with flat trend - likely due to demand ischemia with acute illness - no ischemic work up with normal EF and debilitated state with persistent encephalopathy  6.  HTN  - BP controlled - 115/44mmHg this am  7.  C. Diff - per IM on flagyl  8.  Lower ext edema  - neg DVT on venous dopplers  9.  DNR   10.  Hypokalemia - replete per [facility]  11., Hypomagnesemia - replete per [facility]   Signed, Armanda Magic, MD  07/08/2017, 8:53 AM

## 2017-07-08 NOTE — NC FL2 (Signed)
[place] MEDICAID FL2 LEVEL OF CARE SCREENING TOOL     IDENTIFICATION  Patient Name: Deborah Benjamin Birthdate: [birth year] Sex: female Admission Date (Current Location): 06/29/2017  [facility] [place]:      Facility and Address:         Provider Number: [id number]  Attending Physician Name and Address:  Osvaldo ShipperKrishnan, Gokul, MD  Relative Name and Phone Number:  Vicenta AlyJoyce,sister, [phone]    Current Level of Care: Hospital Recommended Level of Care: Skilled Nursing Facility Prior Approval Number:    Date Approved/Denied:   PASRR Number: [id number]  Discharge Plan: SNF    Current Diagnoses: Patient Active Problem List   Diagnosis Date Noted  . Urinary tract infection without hematuria   . Palliative care encounter   . Goals of care, counseling/discussion   . Atrial fibrillation with RVR (HCC) 06/30/2017  . Acute renal failure superimposed on stage 3 chronic kidney disease (HCC) 06/30/2017  . Acute encephalopathy 06/29/2017  . Sepsis (HCC) 06/29/2017  . AKI (acute kidney injury) (HCC) 06/29/2017  . Hyponatremia 05/30/2017  . Atrial fibrillation and flutter (HCC) 05/30/2017  . Essential hypertension 05/30/2017  . Vertigo 05/30/2017  . Diabetes mellitus type 2 in obese (HCC) 05/30/2017  . Hyperlipidemia 05/30/2017    Orientation RESPIRATION BLADDER Height & Weight     Self  Normal Incontinent, External catheter Weight: 92.3 kg (203 lb 7.8 oz) Height:  5\' 5"  (165.1 cm)  BEHAVIORAL SYMPTOMS/MOOD NEUROLOGICAL BOWEL NUTRITION STATUS      Incontinent (Rectal tube) Diet (Please see DC Summary)  AMBULATORY STATUS COMMUNICATION OF NEEDS Skin   Extensive Assist Verbally Normal                       Personal Care Assistance Level of Assistance  Bathing, Feeding, Dressing Bathing Assistance: Maximum assistance Feeding assistance: Limited assistance Dressing Assistance: Maximum assistance     Functional Limitations Info             SPECIAL CARE  FACTORS FREQUENCY  PT (By licensed PT), OT (By licensed OT)     PT Frequency: 5x/week OT Frequency: 3x/week            Contractures      Additional Factors Info  Code Status, Allergies, Insulin Sliding Scale, Isolation Precautions Code Status Info: DNR Allergies Info: NKA   Insulin Sliding Scale Info: Every 4 hours Isolation Precautions Info: Enteric precautions     Current Medications (07/08/2017):  This is the current hospital active medication list Current Facility-Administered Medications  Medication Dose Route Frequency Provider Last Rate Last Dose  . acetaminophen (TYLENOL) suppository 650 mg  650 mg Rectal Q6H PRN Tat, Onalee Huaavid, MD   650 mg at 06/30/17 1837  . acetaminophen (TYLENOL) tablet 650 mg  650 mg Oral Q4H PRN Jonah BlueYates, Jennifer, MD      . amiodarone (PACERONE) tablet 200 mg  200 mg Oral BID Armanda Magicurner, Traci R, MD   200 mg at 07/08/17 1057  . apixaban (ELIQUIS) tablet 5 mg  5 mg Oral BID Ann HeldMartin, Elizabeth J, RPH   5 mg at 07/08/17 1057  . chlorhexidine (PERIDEX) 0.12 % solution 15 mL  15 mL Mouth Rinse BID Tat, David, MD   15 mL at 07/08/17 0941  . dextrose 5 % with KCl 20 mEq / L  infusion  20 mEq Intravenous Continuous Osvaldo ShipperKrishnan, Gokul, MD 10 mL/hr at 07/08/17 1232 20 mEq at 07/08/17 1232  . insulin aspart (novoLOG) injection 0-9 Units  0-9  Units Subcutaneous Colman Cater, MD   1 Units at 07/08/17 1231  . LORazepam (ATIVAN) injection 0.5 mg  0.5 mg Intravenous Q4H PRN Opyd, Lavone Neri, MD   0.5 mg at 06/30/17 2303  . MEDLINE mouth rinse  15 mL Mouth Rinse q12n4p Tat, Onalee Hua, MD   15 mL at 07/08/17 1102  . metoprolol tartrate (LOPRESSOR) tablet 25 mg  25 mg Oral BID Quintella Reichert, MD   25 mg at 07/08/17 1057  . metroNIDAZOLE (FLAGYL) IVPB 500 mg  500 mg Intravenous Q8H Dorothea Ogle, MD   Stopped at 07/08/17 1157  . nystatin (MYCOSTATIN/NYSTOP) topical powder   Topical TID Eduard Clos, MD      . ondansetron [facility]) injection 4 mg  4 mg Intravenous Q6H PRN Jonah Blue, MD      . sodium chloride flush (NS) 0.9 % injection 10-40 mL  10-40 mL Intracatheter Pablo Ledger, MD   10 mL at 07/08/17 0941  . sodium chloride flush (NS) 0.9 % injection 10-40 mL  10-40 mL Intracatheter PRN Catarina Hartshorn, MD   10 mL at 07/04/17 1610  . vancomycin (VANCOCIN) 50 mg/mL oral solution 500 mg  500 mg Oral Q6H Dorothea Ogle, MD   500 mg at 07/08/17 1231     Discharge Medications: Please see discharge summary for a list of discharge medications.  Relevant Imaging Results:  Relevant Lab Results:   Additional Information SSN: [id number] [place], [place]

## 2017-07-08 NOTE — Progress Notes (Addendum)
ANTICOAGULATION CONSULT NOTE - Follow up  Pharmacy Consult for Heparin >> Apixaban (see addendum) Indication: atrial fibrillation  No Known Allergies  Patient Measurements: Height: 5\' 5"  (165.1 cm) Weight: 203 lb 7.8 oz (92.3 kg) IBW/kg (Calculated) : 57  Vital Signs: Temp: 98 F (36.7 C) (07/13 0731) Temp Source: Oral (07/13 0731) BP: 115/56 (07/13 0731) Pulse Rate: 92 (07/13 0731)  Labs:  Recent Labs  07/06/17 0424 07/07/17 0335 07/08/17 0317 07/08/17 0325  HGB 11.0* 11.2* 11.4*  --   HCT 33.3* 33.5* 33.8*  --   PLT 330 332 347  --   HEPARINUNFRC 0.38 0.30  --  0.39  CREATININE 0.72 0.77 0.76  --    Estimated Creatinine Clearance: 69.2 mL/min (by C-G formula based on SCr of 0.76 mg/dL).  Assessment:  [age] YOF with recent diagnosis of Afib earlier in June and was on apixaban PTA however with questionable compliance admitted on with AMS and transitioned to heparin for anticoagulation until po intake more reliable.   Heparin level this morning remains therapeutic (HL 0.39, goal of 0.3-0.7). CBC stable - no bleeding noted at this time.  Goal of Therapy:  Heparin level 0.3-0.7 units/ml Monitor platelets by anticoagulation protocol: Yes   Plan:  1. Continue Heparin at 900 units/hr (9 ml/hr) 2. Will continue to monitor for any signs/symptoms of bleeding and will follow up with heparin level in the a.m.   Thank you for allowing pharmacy to be a part of this patient's care.  Georgina PillionElizabeth Dagen Beevers, PharmD, BCPS Clinical Pharmacist Clinical phone for 07/08/2017 from 7a-3:30p: [phone] If after 3:30p, please call main pharmacy at: [phone] 07/08/2017 8:39 AM    -------------------------------------------------------------------------------------------------------- Addendum:    Per cardiology - plans are now to transition back to apixaban as the patient is able to take some po medications. Given age<80, wt>60kg, SCr <1.5 - PTA dose of 5 mg bid remains appropriate.  Goal of  Therapy: Appropriate anticoagulation for indication and hepatic/renal function   Plan 1. D/c Heparin 2. Start apixaban 5 mg bid 3. Pharmacy will sign off consult and monitor peripherally since no dose adjustments are expected at this time.  Thank you for allowing pharmacy to be a part of this patient's care.  Georgina PillionElizabeth Helane Briceno, PharmD, BCPS Clinical Pharmacist Clinical phone for 07/08/2017 from 7a-3:30p: [phone] If after 3:30p, please call main pharmacy at: [phone] 07/08/2017 10:43 AM

## 2017-07-08 NOTE — Progress Notes (Signed)
Occupational Therapy Treatment Patient Details Name: Deborah Benjamin MRN: [id number] DOB: 1941-03-06 Today's Date: 07/08/2017    History of present illness Pt transferred from [facility] with acute encephalopathy and afib with rvr. Pt also found to have cdiff. PMH - HTN, DM, vertigo   OT comments  Pt with lethargy, but eyes open and responding inconsistently and slowly to questions. Pt requires total assist for all mobility and ADL. She is profoundly weak. Recommending SNF upon d/c for continued rehab. Will follow acutely.  Follow Up Recommendations  SNF;Supervision/Assistance - 24 hour    Equipment Recommendations       Recommendations for Other Services      Precautions / Restrictions Precautions Precautions: Fall Restrictions Weight Bearing Restrictions: No       Mobility Bed Mobility Overal bed mobility: Needs Assistance Bed Mobility: Rolling Rolling: Total assist         General bed mobility comments: assist for all aspects, pt not initiating any movement  Transfers                 General transfer comment: will need lift equipment    Balance                                           ADL either performed or assessed with clinical judgement   ADL                                         General ADL Comments: Requires total assist.     Vision Baseline Vision/History: Wears glasses Patient Visual Report: No change from baseline     Perception     Praxis      Cognition Arousal/Alertness: Lethargic (increased level of alertness as session continued) Behavior During Therapy: Flat affect Overall Cognitive Status: Impaired/Different from baseline Area of Impairment: Orientation;Attention;Memory;Following commands;Safety/judgement;Problem solving                 Orientation Level: Disoriented to;Time;Situation Current Attention Level: Sustained Memory: Decreased recall of precautions;Decreased  short-term memory Following Commands: Follows one step commands inconsistently;Follows one step commands with increased time Safety/Judgement: Decreased awareness of safety;Decreased awareness of deficits   Problem Solving: Slow processing;Decreased initiation;Difficulty sequencing;Requires verbal cues;Requires tactile cues          Exercises Exercises: Other exercises Other Exercises Other Exercises: B UE PROM   Shoulder Instructions       General Comments      Pertinent Vitals/ Pain       Pain Assessment: Faces Faces Pain Scale: Hurts a little bit Pain Location: buttocks Pain Descriptors / Indicators: Discomfort Pain Intervention(s): Repositioned  Home Living Family/patient expects to be discharged to:: Private residence Living Arrangements: Alone Available Help at Discharge: Family;Available PRN/intermittently Type of Home: House                       Home Equipment: Walker - 2 wheels;Cane - single point   Additional Comments: Per notes pt doesn't use assistive device in the house. Per notes sister reports house is not clean      Prior Functioning/Environment Level of Independence: Independent with assistive device(s)            Frequency  Min 2X/week        Progress  Toward Goals  OT Goals(current goals can now be found in the care plan section)     Acute Rehab OT Goals Patient Stated Goal: Pt didn't state OT Goal Formulation: Patient unable to participate in goal setting Time For Goal Achievement: 07/22/17 Potential to Achieve Goals: Fair ADL Goals Pt Will Perform Eating: with mod assist;sitting;bed level Pt Will Perform Grooming: with mod assist;sitting;bed level Additional ADL Goal #1: Pt will follow one step commands 25% of time. Additional ADL Goal #2: Pt will roll with moderate assistance for repositioning and pericare.  Plan      Co-evaluation                 AM-PAC PT "6 Clicks" Daily Activity     Outcome Measure    Help from another person eating meals?: Total Help from another person taking care of personal grooming?: Total Help from another person toileting, which includes using toliet, bedpan, or urinal?: Total Help from another person bathing (including washing, rinsing, drying)?: Total Help from another person to put on and taking off regular upper body clothing?: Total Help from another person to put on and taking off regular lower body clothing?: Total 6 Click Score: 6    End of Session    OT Visit Diagnosis: Muscle weakness (generalized) (M62.81);Cognitive communication deficit (R41.841);Pain   Activity Tolerance Patient limited by lethargy   Patient Left in bed;with call bell/phone within reach;with bed alarm set   Nurse Communication          Time: 1355-1420 OT Time Calculation (min): 25 min  Charges: OT General Charges $OT Visit: 1 Procedure OT Evaluation $OT Eval Moderate Complexity: 1 Procedure OT Treatments $Therapeutic Exercise: 8-22 mins   [name], [name] 07/08/2017, 2:50 PM  [phone]

## 2017-07-08 NOTE — Clinical Social Work Note (Signed)
Clinical Social Work Assessment  Patient Details  Name: Deborah Benjamin MRN: [id number] Date of Birth: 1941-11-16  Date of referral:  07/08/17               Reason for consult:  Facility Placement                Permission sought to share information with:  Facility Industrial/product designerContact Representative Permission granted to share information::  No (DOx3)  Name::     EconomistJoyce  Agency::  SNF  Relationship::  sister  Contact Information:     Housing/Transportation Living arrangements for the past 2 months:  Single Family Home Source of Information:   (sibling) Patient Interpreter Needed:  None Criminal Activity/Legal Involvement Pertinent to Current Situation/Hospitalization:  No - Comment as needed Significant Relationships:  Siblings Lives with:  Self Do you feel safe going back to the place where you live?  No Need for family participation in patient care:  Yes (Comment) (decision making at this time)  Care giving concerns:  Pt lives at home alone- has trouble with her knees but capable of all self care needed.  Pt sister lives next door but unable to provide much physical assistance.   Social Worker assessment / plan:  CSW spoke with pt sister concerning PT recommendation for SNF.  CSW explained SNF and SNF referral process.  CSW also discussed insurance coverage for rehab stay.  Employment status:  Retired Database administratornsurance information:  Managed Medicare PT Recommendations:  Skilled Nursing Facility Information / Referral to community resources:  Skilled Nursing Facility  Patient/Family's Response to care:  Pt sister agreeable to SNF stay- was thinking patient would need rehab but didn't know where to start.  Patient/Family's Understanding of and Emotional Response to Diagnosis, Current Treatment, and Prognosis:  Pt sister has good understanding of pt current needs and is realistic about recovery process- hopeful pt will make full recovery at SNF.  Emotional Assessment Appearance:  Appears stated  age Attitude/Demeanor/Rapport:  Unable to Assess Affect (typically observed):  Unable to Assess Orientation:  Oriented to Self Alcohol / Substance use:  Not Applicable Psych involvement (Current and /or in the community):  No (Comment)  Discharge Needs  Concerns to be addressed:  Care Coordination Readmission within the last 30 days:  Yes Current discharge risk:  Physical Impairment Barriers to Discharge:  Continued Medical Work up   Burna SisUris, Gurley Climer H, LCSW 07/08/2017, 3:02 PM

## 2017-07-08 NOTE — Progress Notes (Signed)
[facility] PROGRESS NOTE  Deborah Benjamin ZOX:[id number] DOB: 06/16/1941 DOA: 06/29/2017  PCP: Pearson Grippe, MD  Brief History/Interval Summary: 77 year old Caucasian female with a past medical history of hypertension, hyperlipoidemia, diabetes, recently diagnosed atrial fibrillation, presented with altered mental status. Patient was noted to be in atrial fibrillation with RVR. There was also concern for UTI and the patient was treated with antibiotics. Patient remained encephalopathic. So she was transferred over from [facility] to [facility]. Seen by neurology and cardiology. Currently on amiodarone. Subsequently also found to have positive C. difficile.  Reason for Visit: Acute encephalopathy. Atrial fibrillation.  Consultants: Cardiology. Palliative medicine. Neurology.  Procedures:  EEG Impression: This EEG is abnormal due to moderate diffuse slowing of the waking background with triphasic waves seen. Clinical Correlation of the above findings indicates diffuse cerebral dysfunction that is non-specific in etiology and can be seen with hypoxic/ischemic injury, toxic/metabolic encephalopathies, or medication effect. Triphasic waves are typically seen with hepatic encephalopathy, but may be seen with other metabolic encephalopathies as well.  The absence of epileptiform discharges does not rule out a clinical diagnosis of epilepsy.  Clinical correlation is advised.  Antibiotics: Initially on vancomycin and Zosyn. Currently on oral vancomycin and IV Flagyl  Subjective/Interval History: Patient seems to be more responsive today. Answering a few questions. Still not very communicative. Was able to swallow her tablets this morning per nurse.   ROS: Unable to do due to her encephalopathy  Objective:  Vital Signs  Vitals:   07/08/17 0400 07/08/17 0500 07/08/17 0600 07/08/17 0731  BP: (!) 134/96 (!) 139/92 137/87 (!) 115/56  Pulse: 82 86 98 92  Resp: 20 15 19      Temp:    98 F (36.7 C)  TempSrc:    Oral  SpO2: 98% 98% 98% 99%  Weight:      Height:        Intake/Output Summary (Last 24 hours) at 07/08/17 0751 Last data filed at 07/08/17 0600  Gross per 24 hour  Intake           1066.1 ml  Output              950 ml  Net            116.1 ml   Filed Weights   07/02/17 0400 07/03/17 0500 07/03/17 1500  Weight: 93.5 kg (206 lb 2.1 oz) 93.8 kg (206 lb 12.7 oz) 92.3 kg (203 lb 7.8 oz)    General appearance: Awake, alert. Not very communicative. Resp: Diminished air entry at the bases but clear to auscultation Cardio: S1, S2 is irregularly irregular. No S3, S4. No rubs, murmurs, or bruit. GI: Abdomen is soft. Nontender, nondistended. Bowel sounds are present. No masses edema Extremities: No edema Neurologic: Poor effort. No focal neurological deficits  Lab Results:  Data Reviewed: I have personally reviewed following labs and imaging studies  CBC:  Recent Labs Lab 07/04/17 0211 07/05/17 0540 07/06/17 0424 07/07/17 0335 07/08/17 0317  WBC 15.2* 13.6* 13.3* 13.9* 14.0*  HGB 10.6* 10.5* 11.0* 11.2* 11.4*  HCT 32.4* 32.1* 33.3* 33.5* 33.8*  MCV 90.8 90.7 89.5 89.1 88.7  PLT 314 307 330 332 347    Basic Metabolic Panel:  Recent Labs Lab 07/02/17 0450 07/03/17 0400 07/04/17 0211 07/06/17 0424 07/07/17 0335 07/08/17 0317  NA 147* 149* 143 133* 136 132*  K 3.4* 3.6 3.1* 3.4* 3.3* 3.4*  CL 116* 117* 112* 103 102 100*  CO2 21* 23 23  23 25 24   GLUCOSE 160* 170* 162* 146* 135* 140*  BUN 21* 17 12 10 9 8   CREATININE 0.97 0.91 0.82 0.72 0.77 0.76  CALCIUM 8.6* 8.7* 8.4* 8.3* 8.2* 8.2*  MG 1.8  --   --  1.2*  --   --     GFR: Estimated Creatinine Clearance: 69.2 mL/min (by C-G formula based on SCr of 0.76 mg/dL).  Liver Function Tests:  Recent Labs Lab 07/04/17 0211  AST 41  ALT 49  ALKPHOS 69  BILITOT 0.4  PROT 4.9*  ALBUMIN 2.4*    CBG:  Recent Labs Lab 07/07/17 1553 07/07/17 2041 07/07/17 2300  07/08/17 0356 07/08/17 0729  GLUCAP 120* 160* 136* 133* 135*     Recent Results (from the past 240 hour(s))  Blood Culture (routine x 2)     Status: None   Collection Time: 06/29/17  1:35 PM  Result Value Ref Range Status   Specimen Description BLOOD RIGHT WRIST  Final   Special Requests   Final    BOTTLES DRAWN AEROBIC AND ANAEROBIC Blood Culture results may not be optimal due to an inadequate volume of blood received in culture bottles   Culture NO GROWTH 5 DAYS  Final   Report Status 07/04/2017 FINAL  Final  Urine culture     Status: Abnormal   Collection Time: 06/29/17  1:42 PM  Result Value Ref Range Status   Specimen Description URINE, CLEAN CATCH  Final   Special Requests NONE  Final   Culture MULTIPLE SPECIES PRESENT, SUGGEST RECOLLECTION (A)  Final   Report Status 07/01/2017 FINAL  Final  Culture, blood (Routine X 2) w Reflex to ID Panel     Status: None   Collection Time: 06/29/17  3:55 PM  Result Value Ref Range Status   Specimen Description BLOOD LEFT HAND  Final   Special Requests   Final    Blood Culture results may not be optimal due to an inadequate volume of blood received in culture bottles   Culture NO GROWTH 5 DAYS  Final   Report Status 07/04/2017 FINAL  Final  MRSA PCR Screening     Status: None   Collection Time: 06/29/17 10:05 PM  Result Value Ref Range Status   MRSA by PCR NEGATIVE NEGATIVE Final    Comment:        The GeneXpert MRSA Assay (FDA approved for NASAL specimens only), is one component of a comprehensive MRSA colonization surveillance program. It is not intended to diagnose MRSA infection nor to guide or monitor treatment for MRSA infections.   MRSA PCR Screening     Status: None   Collection Time: 07/03/17  2:29 PM  Result Value Ref Range Status   MRSA by PCR NEGATIVE NEGATIVE Final    Comment:        The GeneXpert MRSA Assay (FDA approved for NASAL specimens only), is one component of a comprehensive MRSA  colonization surveillance program. It is not intended to diagnose MRSA infection nor to guide or monitor treatment for MRSA infections.   C difficile quick scan w PCR reflex     Status: Abnormal   Collection Time: 07/03/17  5:27 PM  Result Value Ref Range Status   C Diff antigen POSITIVE (A) NEGATIVE Final   C Diff toxin NEGATIVE NEGATIVE Final   C Diff interpretation Results are indeterminate. See PCR results.  Final  Clostridium Difficile by PCR     Status: Abnormal   Collection Time: 07/03/17  5:27  PM  Result Value Ref Range Status   Toxigenic C Difficile by pcr POSITIVE (A) NEGATIVE Final    Comment: Positive for toxigenic C. difficile with little to no toxin production. Only treat if clinical presentation suggests symptomatic illness.      Radiology Studies: Dg Swallowing Func-speech Pathology  Result Date: 07/07/2017 Objective Swallowing Evaluation: Type of Study: MBS-Modified Barium Swallow Study Patient Details Name: FLORA PARKS MRN: [id number] Date of Birth: 02-Sep-1941 Today's Date: 07/07/2017 Time: SLP Start Time (ACUTE ONLY): 1330-SLP Stop Time (ACUTE ONLY): 1350 SLP Time Calculation (min) (ACUTE ONLY): 20 min Past Medical History: Past Medical History: Diagnosis Date . Atrial fibrillation (HCC)  . Diabetes mellitus without complication (HCC)  . High cholesterol  . Hypertension  . Vertigo  Past Surgical History: Past Surgical History: Procedure Laterality Date . ABDOMINAL HYSTERECTOMY   . CHOLECYSTECTOMY   . CYST EXCISION   HPI: Bobette L Dameronis a 76 y.o.femalewith medical history significant of HTN, HLD, DM, vertigo and recent (6/4-5) admission for new-onset afib and hyponatremia presenting with altered mental status. 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 Diagnosed with acute metabolic encephalopathy, A-fib, acute on chronic renal failure, sepsis (urine source vs possible aspiration).  CXR 7/5 aortic atherosclerosis. No edema or consolidation. No Data Recorded Assessment / Plan / Recommendation CHL IP CLINICAL IMPRESSIONS 07/07/2017 Clinical Impression Pt mildly drowsy during MBS however alert enough for MBS. Oral holding and delayed transit likely due to cognitive impairments versus a true oral dysphagia. Holding boluses with delayed oral transit and unable to effecitvely masticate solid texture which was expectorated. One episode of flash penetration with thin with multiple cup and straw sips. Mild vallecular and pyriform sinus residue cleared with cues for second swallow. Recommend thin liquids, and puree (D1) and thin liquids, small sips straw allowed, crush meds, full supervision, sit upright and eat when alert.  SLP Visit Diagnosis Dysphagia, pharyngeal phase (R13.13) Attention and concentration deficit following -- Frontal lobe and executive function deficit following -- Impact on safety and function Mild aspiration risk   CHL IP TREATMENT RECOMMENDATION 07/07/2017 Treatment Recommendations Therapy as outlined in treatment plan below   Prognosis 07/07/2017 Prognosis for Safe Diet Advancement Good Barriers to Reach Goals -- Barriers/Prognosis Comment -- CHL IP DIET RECOMMENDATION 07/07/2017 SLP Diet Recommendations Dysphagia 1 (Puree) solids;Thin liquid Liquid Administration via Cup;Straw Medication Administration Crushed with puree Compensations Slow rate;Minimize environmental distractions;Small sips/bites Postural Changes Seated upright at 90 degrees   CHL IP OTHER RECOMMENDATIONS 07/07/2017 Recommended Consults -- Oral Care Recommendations Oral care BID Other Recommendations --   CHL IP FOLLOW UP RECOMMENDATIONS 07/07/2017 Follow up Recommendations (No Data)   CHL IP FREQUENCY AND DURATION 07/07/2017 Speech Therapy Frequency (ACUTE ONLY) min 2x/week Treatment Duration 2 weeks      CHL IP ORAL PHASE 07/07/2017 Oral Phase Impaired Oral - Pudding Teaspoon -- Oral - Pudding Cup -- Oral - Honey  Teaspoon -- Oral - Honey Cup -- Oral - Nectar Teaspoon -- Oral - Nectar Cup Delayed oral transit;Holding of bolus Oral - Nectar Straw -- Oral - Thin Teaspoon -- Oral - Thin Cup Holding of bolus;Delayed oral transit Oral - Thin Straw -- Oral - Puree -- Oral - Mech Soft -- Oral - Regular Impaired mastication Oral - Multi-Consistency Delayed oral transit Oral - Pill -- Oral Phase - Comment --  CHL IP PHARYNGEAL PHASE 07/07/2017 Pharyngeal Phase Impaired Pharyngeal- Pudding Teaspoon -- Pharyngeal -- Pharyngeal-  Pudding Cup -- Pharyngeal -- Pharyngeal- Honey Teaspoon -- Pharyngeal -- Pharyngeal- Honey Cup -- Pharyngeal -- Pharyngeal- Nectar Teaspoon -- Pharyngeal -- Pharyngeal- Nectar Cup Pharyngeal residue - valleculae;Pharyngeal residue - pyriform;Reduced epiglottic inversion;Reduced laryngeal elevation Pharyngeal -- Pharyngeal- Nectar Straw -- Pharyngeal -- Pharyngeal- Thin Teaspoon -- Pharyngeal -- Pharyngeal- Thin Cup Pharyngeal residue - valleculae;Pharyngeal residue - pyriform;Reduced laryngeal elevation;Reduced epiglottic inversion Pharyngeal -- Pharyngeal- Thin Straw Pharyngeal residue - valleculae Pharyngeal -- Pharyngeal- Puree -- Pharyngeal -- Pharyngeal- Mechanical Soft -- Pharyngeal -- Pharyngeal- Regular (No Data) Pharyngeal -- Pharyngeal- Multi-consistency Pharyngeal residue - valleculae;Reduced epiglottic inversion Pharyngeal -- Pharyngeal- Pill -- Pharyngeal -- Pharyngeal Comment --  CHL IP CERVICAL ESOPHAGEAL PHASE 07/07/2017 Cervical Esophageal Phase WFL Pudding Teaspoon -- Pudding Cup -- Honey Teaspoon -- Honey Cup -- Nectar Teaspoon -- Nectar Cup -- Nectar Straw -- Thin Teaspoon -- Thin Cup -- Thin Straw -- Puree -- Mechanical Soft -- Regular -- Multi-consistency -- Pill -- Cervical Esophageal Comment -- No flowsheet data found. Royce Macadamia 07/07/2017, 3:36 PM      [name] [name].Ed CCC-SLP Pager [phone]           Medications:  Scheduled: . chlorhexidine  15 mL Mouth Rinse BID   . insulin aspart  0-9 Units Subcutaneous Q4H  . mouth rinse  15 mL Mouth Rinse q12n4p  . metoprolol tartrate  5 mg Intravenous Q4H  . nystatin   Topical TID  . sodium chloride flush  10-40 mL Intracatheter Q12H  . vancomycin  500 mg Oral Q6H   Continuous: . amiodarone 30 mg/hr (07/08/17 0319)  . dextrose 5 % with KCl 20 mEq / L 20 mEq (07/06/17 1012)  . heparin 900 Units/hr (07/06/17 2039)  . metronidazole Stopped (07/08/17 0419)   JYN:[id number], acetaminophen, LORazepam, ondansetron (ZOFRAN) IV, sodium chloride flush  Assessment/Plan:  Principal Problem:   Acute encephalopathy Active Problems:   Essential hypertension   Diabetes mellitus type 2 in obese [facility])   Atrial fibrillation with RVR (HCC)   Acute renal failure superimposed on stage 3 chronic kidney disease (HCC)   Urinary tract infection without hematuria   Palliative care encounter   Goals of care, counseling/discussion    Acute metabolic encephalopathy/tremor/rhythmic movements Etiology thought to be multifactorial including infection in the form of UTI, hyponatremia, C. difficile, acute renal failure and atrial fibrillation. Patient has been very slow to improve. Patient underwent imaging studies in the form of CT scan and MRI brain which have not revealed any clear cut reason. Patient also underwent EEG which did not show any epileptiform activity. Patient has been seen by neurology. Vitamin B-12 and ammonia levels were normal. No further testing is thought to be necessary. Palliative medicine was consulted and they are following. Seen by speech therapy. Approved for dysphagia 1 diet. 4. Aspiration precautions.  Atrial fibrillation with RVR/elevated troponin/demand ischemia Chads2vasc score is 5. Patient is being seen by cardiology. Patient was placed on amiodarone infusion and IV heparin. She was also on IV metoprolol. These medications should be transitioned to oral today. Was previously on oral anticoagulants  (Eliquis).  C. difficile colitis. Stable. Diarrhea appears to be slowing down. Continue intravenous Flagyl and oral vancomycin.   Acute on chronic kidney disease stage III. Renal function close to baseline.  Urinary tract infection. No growth noted on urine cultures. Antibiotics were discontinued.  Hypernatremia/hypokalemia Mildly hyponatremic now. Replace potassium. Magnesium level was noted to be low few days ago. Unclear if this was repleted. We will recheck today.  Essential hypertension. Stable.  Diabetes mellitus type 2. Holding her metformin. Last HbA1c was 6.6. Continue SSI.  DVT Prophylaxis: On IV heparin    Code Status: DO NOT RESUSCITATE Family Communication: Discussed with sister at bedside yesterday. No family at bedside today. Disposition Plan: Management as outlined above.    LOS: 9 days   [facility]  [facility] Pager [phone] 07/08/2017, 7:51 AM  If 7PM-7AM, please contact night-coverage at [email or web address], password [facility]

## 2017-07-09 LAB — BASIC METABOLIC PANEL
ANION GAP: 10 (ref 5–15)
BUN: 11 mg/dL (ref 6–20)
CHLORIDE: 101 mmol/L (ref 101–111)
CO2: 24 mmol/L (ref 22–32)
Calcium: 8.7 mg/dL — ABNORMAL LOW (ref 8.9–10.3)
Creatinine, Ser: 0.96 mg/dL (ref 0.44–1.00)
GFR calc non Af Amer: 57 mL/min — ABNORMAL LOW (ref >60)
Glucose, Bld: 135 mg/dL — ABNORMAL HIGH (ref 65–99)
POTASSIUM: 3.6 mmol/L (ref 3.5–5.1)
Sodium: 135 mmol/L (ref 135–145)

## 2017-07-09 LAB — CBC
HEMATOCRIT: 35.2 % — AB (ref 36.0–46.0)
HEMOGLOBIN: 11.7 g/dL — AB (ref 12.0–15.0)
MCH: 29.8 pg (ref 26.0–34.0)
MCHC: 33.2 g/dL (ref 30.0–36.0)
MCV: 89.6 fL (ref 78.0–100.0)
Platelets: 372 10*3/uL (ref 150–400)
RBC: 3.93 MIL/uL (ref 3.87–5.11)
RDW: 13.7 % (ref 11.5–15.5)
WBC: 13.6 10*3/uL — ABNORMAL HIGH (ref 4.0–10.5)

## 2017-07-09 LAB — GLUCOSE, CAPILLARY
GLUCOSE-CAPILLARY: 132 mg/dL — AB (ref 65–99)
GLUCOSE-CAPILLARY: 131 mg/dL — AB (ref 65–99)
Glucose-Capillary: 149 mg/dL — ABNORMAL HIGH (ref 65–99)
Glucose-Capillary: 138 mg/dL — ABNORMAL HIGH (ref 65–99)
Glucose-Capillary: 150 mg/dL — ABNORMAL HIGH (ref 65–99)

## 2017-07-09 LAB — MAGNESIUM: MAGNESIUM: 1.7 mg/dL (ref 1.7–2.4)

## 2017-07-09 MED ORDER — METOPROLOL TARTRATE 50 MG PO TABS
50.0000 mg | ORAL_TABLET | Freq: Two times a day (BID) | ORAL | Status: DC
  Administered 2017-07-10 – 2017-07-11 (×3): 50 mg via ORAL
  Filled 2017-07-09 (×4): qty 1

## 2017-07-09 MED ORDER — MAGNESIUM SULFATE IN D5W 1-5 GM/100ML-% IV SOLN
1.0000 g | Freq: Once | INTRAVENOUS | Status: AC
  Administered 2017-07-09: 1 g via INTRAVENOUS
  Filled 2017-07-09: qty 100

## 2017-07-09 MED ORDER — POTASSIUM CHLORIDE 10 MEQ/100ML IV SOLN
10.0000 meq | INTRAVENOUS | Status: AC
Start: 2017-07-09 — End: 2017-07-09
  Administered 2017-07-09 (×2): 10 meq via INTRAVENOUS
  Filled 2017-07-09 (×2): qty 100

## 2017-07-09 MED ORDER — METOPROLOL TARTRATE 25 MG PO TABS
25.0000 mg | ORAL_TABLET | Freq: Once | ORAL | Status: AC
  Administered 2017-07-09: 25 mg via ORAL
  Filled 2017-07-09: qty 1

## 2017-07-09 NOTE — Progress Notes (Signed)
[facility] PROGRESS NOTE  Deborah Benjamin ZOX:[id number] DOB: Oct 25, 1941 DOA: 06/29/2017  PCP: Pearson GrippeKim, James, MD  Brief History/Interval Summary: 76 year old Caucasian female with a past medical history of hypertension, hyperlipoidemia, diabetes, recently diagnosed atrial fibrillation, presented with altered mental status. Patient was noted to be in atrial fibrillation with RVR. There was also concern for UTI and the patient was treated with antibiotics. Patient remained encephalopathic. So she was [facility]. Seen by neurology and cardiology. Currently on amiodarone. Subsequently also found to have positive C. Difficile. Patient's mental status has been very slow to improve.  Reason for Visit: Acute encephalopathy. Atrial fibrillation.  Consultants: Cardiology. Palliative medicine. Neurology.  Procedures:  EEG Impression: This EEG is abnormal due to moderate diffuse slowing of the waking background with triphasic waves seen. Clinical Correlation of the above findings indicates diffuse cerebral dysfunction that is non-specific in etiology and can be seen with hypoxic/ischemic injury, toxic/metabolic encephalopathies, or medication effect. Triphasic waves are typically seen with hepatic encephalopathy, but may be seen with other metabolic encephalopathies as well.  The absence of epileptiform discharges does not rule out a clinical diagnosis of epilepsy.  Clinical correlation is advised.  Antibiotics: Initially on vancomycin and Zosyn. Currently on oral vancomycin and IV Flagyl  Subjective/Interval History: Patient about the same as yesterday. Responses only to certain questions. Has been able to swallow her tablets according to nurse.   ROS: Denies any pain  Objective:  Vital Signs  Vitals:   07/09/17 0400 07/09/17 0520 07/09/17 0716 07/09/17 0927  BP: 115/61 99/70 123/81 111/80  Pulse: (!) 102 97 97 97  Resp: (!) 8 18  (!) 23   Temp: 97.7 F (36.5 C)  97.6 F (36.4 C)   TempSrc: Oral  Oral   SpO2: 95% 97% 97%   Weight:      Height:        Intake/Output Summary (Last 24 hours) at 07/09/17 1136 Last data filed at 07/09/17 0928  Gross per 24 hour  Intake           659.05 ml  Output              750 ml  Net           -90.95 ml   Filed Weights   07/02/17 0400 07/03/17 0500 07/03/17 1500  Weight: 93.5 kg (206 lb 2.1 oz) 93.8 kg (206 lb 12.7 oz) 92.3 kg (203 lb 7.8 oz)    General appearance: Awake, alert. In no distress Resp: Diminished air entry at the bases. Clear to auscultation. Cardio: S1, S2 is irregularly irregular. No S3, S4, murmurs or bruit. GI: Abdomen is soft. Nontender, nondistended. Bowel sounds are present. No masses or organomegaly Extremities: No edema Neurologic: No neurological deficits appreciated. Poor effort overall  Lab Results:  Data Reviewed: I have personally reviewed following labs and imaging studies  CBC:  Recent Labs Lab 07/05/17 0540 07/06/17 0424 07/07/17 0335 07/08/17 0317 07/09/17 0424  WBC 13.6* 13.3* 13.9* 14.0* 13.6*  HGB 10.5* 11.0* 11.2* 11.4* 11.7*  HCT 32.1* 33.3* 33.5* 33.8* 35.2*  MCV 90.7 89.5 89.1 88.7 89.6  PLT 307 330 332 347 372    Basic Metabolic Panel:  Recent Labs Lab 07/04/17 0211 07/06/17 0424 07/07/17 0335 07/08/17 0317 07/08/17 1428 07/09/17 0424  NA 143 133* 136 132*  --  135  K 3.1* 3.4* 3.3* 3.4*  --  3.6  CL 112* 103 102 100*  --  101  CO2 23 23 25 24   --  24  GLUCOSE 162* 146* 135* 140*  --  135*  BUN 12 10 9 8   --  11  CREATININE 0.82 0.72 0.77 0.76  --  0.96  CALCIUM 8.4* 8.3* 8.2* 8.2*  --  8.7*  MG  --  1.2*  --   --  1.2* 1.7    GFR: Estimated Creatinine Clearance: 57.7 mL/min (by C-G formula based on SCr of 0.96 mg/dL).  Liver Function Tests:  Recent Labs Lab 07/04/17 0211  AST 41  ALT 49  ALKPHOS 69  BILITOT 0.4  PROT 4.9*  ALBUMIN 2.4*    CBG:  Recent Labs Lab 07/08/17 1647  07/08/17 1958 07/08/17 2353 07/09/17 0429 07/09/17 0725  GLUCAP 115* 132* 117* 132* 131*     Recent Results (from the past 240 hour(s))  Blood Culture (routine x 2)     Status: None   Collection Time: 06/29/17  1:35 PM  Result Value Ref Range Status   Specimen Description BLOOD RIGHT WRIST  Final   Special Requests   Final    BOTTLES DRAWN AEROBIC AND ANAEROBIC Blood Culture results may not be optimal due to an inadequate volume of blood received in culture bottles   Culture NO GROWTH 5 DAYS  Final   Report Status 07/04/2017 FINAL  Final  Urine culture     Status: Abnormal   Collection Time: 06/29/17  1:42 PM  Result Value Ref Range Status   Specimen Description URINE, CLEAN CATCH  Final   Special Requests NONE  Final   Culture MULTIPLE SPECIES PRESENT, SUGGEST RECOLLECTION (A)  Final   Report Status 07/01/2017 FINAL  Final  Culture, blood (Routine X 2) w Reflex to ID Panel     Status: None   Collection Time: 06/29/17  3:55 PM  Result Value Ref Range Status   Specimen Description BLOOD LEFT HAND  Final   Special Requests   Final    Blood Culture results may not be optimal due to an inadequate volume of blood received in culture bottles   Culture NO GROWTH 5 DAYS  Final   Report Status 07/04/2017 FINAL  Final  MRSA PCR Screening     Status: None   Collection Time: 06/29/17 10:05 PM  Result Value Ref Range Status   MRSA by PCR NEGATIVE NEGATIVE Final    Comment:        The GeneXpert MRSA Assay (FDA approved for NASAL specimens only), is one component of a comprehensive MRSA colonization surveillance program. It is not intended to diagnose MRSA infection nor to guide or monitor treatment for MRSA infections.   MRSA PCR Screening     Status: None   Collection Time: 07/03/17  2:29 PM  Result Value Ref Range Status   MRSA by PCR NEGATIVE NEGATIVE Final    Comment:        The GeneXpert MRSA Assay (FDA approved for NASAL specimens only), is one component of  a comprehensive MRSA colonization surveillance program. It is not intended to diagnose MRSA infection nor to guide or monitor treatment for MRSA infections.   C difficile quick scan w PCR reflex     Status: Abnormal   Collection Time: 07/03/17  5:27 PM  Result Value Ref Range Status   C Diff antigen POSITIVE (A) NEGATIVE Final   C Diff toxin NEGATIVE NEGATIVE Final   C Diff interpretation Results are indeterminate. See PCR results.  Final  Clostridium Difficile by  PCR     Status: Abnormal   Collection Time: 07/03/17  5:27 PM  Result Value Ref Range Status   Toxigenic C Difficile by pcr POSITIVE (A) NEGATIVE Final    Comment: Positive for toxigenic C. difficile with little to no toxin production. Only treat if clinical presentation suggests symptomatic illness.      Radiology Studies: Dg Swallowing Func-speech Pathology  Result Date: 07/07/2017 Objective Swallowing Evaluation: Type of Study: MBS-Modified Barium Swallow Study Patient Details Name: Deborah Benjamin MRN: [id number] Date of Birth: February 09, 1941 Today's Date: 07/07/2017 Time: SLP Start Time (ACUTE ONLY): 1330-SLP Stop Time (ACUTE ONLY): 1350 SLP Time Calculation (min) (ACUTE ONLY): 20 min Past Medical History: Past Medical History: Diagnosis Date . Atrial fibrillation (HCC)  . Diabetes mellitus without complication (HCC)  . High cholesterol  . Hypertension  . Vertigo  Past Surgical History: Past Surgical History: Procedure Laterality Date . ABDOMINAL HYSTERECTOMY   . CHOLECYSTECTOMY   . CYST EXCISION   HPI: Zeola L Dameronis a 76 y.o.femalewith medical history significant of HTN, HLD, DM, vertigo and recent (6/4-5) admission for new-onset afib and hyponatremia presenting with altered mental status. 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 Diagnosed with acute metabolic encephalopathy, A-fib, acute on chronic renal failure, sepsis (urine source vs  possible aspiration). CXR 7/5 aortic atherosclerosis. No edema or consolidation. No Data Recorded Assessment / Plan / Recommendation CHL IP CLINICAL IMPRESSIONS 07/07/2017 Clinical Impression Pt mildly drowsy during MBS however alert enough for MBS. Oral holding and delayed transit likely due to cognitive impairments versus a true oral dysphagia. Holding boluses with delayed oral transit and unable to effecitvely masticate solid texture which was expectorated. One episode of flash penetration with thin with multiple cup and straw sips. Mild vallecular and pyriform sinus residue cleared with cues for second swallow. Recommend thin liquids, and puree (D1) and thin liquids, small sips straw allowed, crush meds, full supervision, sit upright and eat when alert.  SLP Visit Diagnosis Dysphagia, pharyngeal phase (R13.13) Attention and concentration deficit following -- Frontal lobe and executive function deficit following -- Impact on safety and function Mild aspiration risk   CHL IP TREATMENT RECOMMENDATION 07/07/2017 Treatment Recommendations Therapy as outlined in treatment plan below   Prognosis 07/07/2017 Prognosis for Safe Diet Advancement Good Barriers to Reach Goals -- Barriers/Prognosis Comment -- CHL IP DIET RECOMMENDATION 07/07/2017 SLP Diet Recommendations Dysphagia 1 (Puree) solids;Thin liquid Liquid Administration via Cup;Straw Medication Administration Crushed with puree Compensations Slow rate;Minimize environmental distractions;Small sips/bites Postural Changes Seated upright at 90 degrees   CHL IP OTHER RECOMMENDATIONS 07/07/2017 Recommended Consults -- Oral Care Recommendations Oral care BID Other Recommendations --   CHL IP FOLLOW UP RECOMMENDATIONS 07/07/2017 Follow up Recommendations (No Data)   CHL IP FREQUENCY AND DURATION 07/07/2017 Speech Therapy Frequency (ACUTE ONLY) min 2x/week Treatment Duration 2 weeks      CHL IP ORAL PHASE 07/07/2017 Oral Phase Impaired Oral - Pudding Teaspoon -- Oral - Pudding  Cup -- Oral - Honey Teaspoon -- Oral - Honey Cup -- Oral - Nectar Teaspoon -- Oral - Nectar Cup Delayed oral transit;Holding of bolus Oral - Nectar Straw -- Oral - Thin Teaspoon -- Oral - Thin Cup Holding of bolus;Delayed oral transit Oral - Thin Straw -- Oral - Puree -- Oral - Mech Soft -- Oral - Regular Impaired mastication Oral - Multi-Consistency Delayed oral transit Oral - Pill -- Oral Phase - Comment --  CHL  IP PHARYNGEAL PHASE 07/07/2017 Pharyngeal Phase Impaired Pharyngeal- Pudding Teaspoon -- Pharyngeal -- Pharyngeal- Pudding Cup -- Pharyngeal -- Pharyngeal- Honey Teaspoon -- Pharyngeal -- Pharyngeal- Honey Cup -- Pharyngeal -- Pharyngeal- Nectar Teaspoon -- Pharyngeal -- Pharyngeal- Nectar Cup Pharyngeal residue - valleculae;Pharyngeal residue - pyriform;Reduced epiglottic inversion;Reduced laryngeal elevation Pharyngeal -- Pharyngeal- Nectar Straw -- Pharyngeal -- Pharyngeal- Thin Teaspoon -- Pharyngeal -- Pharyngeal- Thin Cup Pharyngeal residue - valleculae;Pharyngeal residue - pyriform;Reduced laryngeal elevation;Reduced epiglottic inversion Pharyngeal -- Pharyngeal- Thin Straw Pharyngeal residue - valleculae Pharyngeal -- Pharyngeal- Puree -- Pharyngeal -- Pharyngeal- Mechanical Soft -- Pharyngeal -- Pharyngeal- Regular (No Data) Pharyngeal -- Pharyngeal- Multi-consistency Pharyngeal residue - valleculae;Reduced epiglottic inversion Pharyngeal -- Pharyngeal- Pill -- Pharyngeal -- Pharyngeal Comment --  CHL IP CERVICAL ESOPHAGEAL PHASE 07/07/2017 Cervical Esophageal Phase WFL Pudding Teaspoon -- Pudding Cup -- Honey Teaspoon -- Honey Cup -- Nectar Teaspoon -- Nectar Cup -- Nectar Straw -- Thin Teaspoon -- Thin Cup -- Thin Straw -- Puree -- Mechanical Soft -- Regular -- Multi-consistency -- Pill -- Cervical Esophageal Comment -- No flowsheet data found. Royce Macadamia 07/07/2017, 3:36 PM      [name] [name].Ed CCC-SLP Pager [phone]           Medications:  Scheduled: . amiodarone  200 mg  Oral BID  . apixaban  5 mg Oral BID  . chlorhexidine  15 mL Mouth Rinse BID  . insulin aspart  0-9 Units Subcutaneous Q4H  . mouth rinse  15 mL Mouth Rinse q12n4p  . metoprolol tartrate  25 mg Oral BID  . nystatin   Topical TID  . sodium chloride flush  10-40 mL Intracatheter Q12H  . vancomycin  500 mg Oral Q6H   Continuous: . dextrose 5 % with KCl 20 mEq / L 20 mEq (07/08/17 1232)  . metronidazole 500 mg (07/09/17 1050)   AVW:[id number], acetaminophen, LORazepam, ondansetron (ZOFRAN) IV, sodium chloride flush  Assessment/Plan:  Principal Problem:   Acute encephalopathy Active Problems:   Essential hypertension   Diabetes mellitus type 2 in obese [facility])   Atrial fibrillation with RVR (HCC)   Acute renal failure superimposed on stage 3 chronic kidney disease (HCC)   Urinary tract infection without hematuria   Palliative care encounter   Goals of care, counseling/discussion    Acute metabolic encephalopathy/tremor/rhythmic movements Etiology thought to be multifactorial including infection in the form of UTI, hyponatremia, C. difficile, acute renal failure and atrial fibrillation. Patient has been very slow to improve, but she has run slight improvement over the last few days. Patient underwent imaging studies in the form of CT scan and MRI brain which have not revealed any clear cut reason. Patient also underwent EEG which did not show any epileptiform activity. Patient has been seen by neurology. Vitamin B-12 and ammonia levels were normal. No further testing is thought to be necessary. Palliative medicine was consulted and they are following. Seen by speech therapy. Approved for dysphagia 1 diet. Aspiration precautions.  Atrial fibrillation with RVR/elevated troponin/demand ischemia Chads2vasc score is 5. Patient is being seen by cardiology. Patient was initially placed on amiodarone infusion and IV heparin. She was also on IV metoprolol. Now on oral amiodarone and metoprolol.  On Eliquis. Heart rate is reasonably well controlled.  C. difficile colitis. Stable. Diarrhea appears to be slowing down. Continue intravenous Flagyl and oral vancomycin.   Acute on chronic kidney disease stage III. Renal function close to baseline.  Urinary tract infection. No growth noted on urine cultures. Antibiotics were  discontinued.  Hypernatremia/hypokalemia/hypomagnesemia Sodium level was normal. Potassium has improved. Magnesium level was normal.   Essential hypertension. Stable.  Diabetes mellitus type 2. Holding her metformin. Last HbA1c was 6.6. Continue SSI. Continue to monitor CBGs.  DVT Prophylaxis: Eliquis  Code Status: DO NOT RESUSCITATE Family Communication: No family at bedside Disposition Plan: Management as outlined above. Continue PT and OT evaluation. Okay to transfer to floor.    LOS: 10 days   [facility]  [facility] Pager [phone] 07/09/2017, 11:36 AM  If 7PM-7AM, please contact night-coverage at [email or web address], password [facility]

## 2017-07-09 NOTE — Progress Notes (Signed)
he  Progress Note  Patient Name: Deborah Benjamin Date of Encounter: 07/09/2017  Primary Cardiologist: Dr. Darl HouseholderKoneswaren  Subjective   Denies chest pain or shortness of breath. Denies palpitions  Inpatient Medications    Scheduled Meds: . amiodarone  200 mg Oral BID  . apixaban  5 mg Oral BID  . chlorhexidine  15 mL Mouth Rinse BID  . insulin aspart  0-9 Units Subcutaneous Q4H  . mouth rinse  15 mL Mouth Rinse q12n4p  . metoprolol tartrate  25 mg Oral BID  . nystatin   Topical TID  . sodium chloride flush  10-40 mL Intracatheter Q12H  . vancomycin  500 mg Oral Q6H   Continuous Infusions: . dextrose 5 % with KCl 20 mEq / L 20 mEq (07/08/17 1232)  . magnesium sulfate 1 - 4 g bolus IVPB    . metronidazole Stopped (07/09/17 1150)  . potassium chloride 10 mEq (07/09/17 1220)   PRN Meds: acetaminophen, acetaminophen, LORazepam, ondansetron (ZOFRAN) IV, sodium chloride flush   Vital Signs    Vitals:   07/09/17 0520 07/09/17 0716 07/09/17 0927 07/09/17 1200  BP: 99/70 123/81 111/80 126/70  Pulse: 97 97 97 (!) 114  Resp: 18 (!) 23  15  Temp:  97.6 F (36.4 C)    TempSrc:  Oral    SpO2: 97% 97%  96%  Weight:      Height:        Intake/Output Summary (Last 24 hours) at 07/09/17 1232 Last data filed at 07/09/17 0928  Gross per 24 hour  Intake           659.05 ml  Output              750 ml  Net           -90.95 ml   Filed Weights   07/02/17 0400 07/03/17 0500 07/03/17 1500  Weight: 93.5 kg (206 lb 2.1 oz) 93.8 kg (206 lb 12.7 oz) 92.3 kg (203 lb 7.8 oz)    Telemetry    Atrial fibrillation.  Rates 80s-110s.  PVCs - Personally Reviewed  ECG    N/a - Personally Reviewed  Physical Exam   VS:  BP 126/70 (BP Location: Left Wrist)   Pulse (!) 114   Temp 98 F (36.7 C) (Oral)   Resp 15   Ht 5\' 5"  (1.651 m)   Wt 92.3 kg (203 lb 7.8 oz)   SpO2 96%   BMI 33.86 kg/m  , BMI Body mass index is 33.86 kg/m. GENERAL:  Chronically ill-appearing HEENT: Pupils equal  round and reactive, fundi not visualized, oral mucosa unremarkable NECK:  No jugular venous distention, waveform within normal limits, carotid upstroke brisk and symmetric, no bruits, no thyromegaly LYMPHATICS:  No cervical adenopathy LUNGS:  Clear to auscultation bilaterally HEART:  Irregularly irregular.  Distant heart sounds.  PMI not displaced or sustained,S1 and S2 within normal limits, no S3, no S4, no clicks, no rubs, no murmurs ABD:  Flat, positive bowel sounds normal in frequency in pitch, no bruits, no rebound, no guarding, no midline pulsatile mass, no hepatomegaly, no splenomegaly EXT:  2 plus pulses throughout, no edema, no cyanosis no clubbing SKIN:  No rashes no nodules NEURO:  Cranial nerves II through XII grossly intact, motor grossly intact throughout PSYCH:  Lethargic and responds to questions minimally.     Labs    Chemistry Recent Labs Lab 07/04/17 0211  07/07/17 0335 07/08/17 0317 07/09/17 0424  NA 143  < >  136 132* 135  K 3.1*  < > 3.3* 3.4* 3.6  CL 112*  < > 102 100* 101  CO2 23  < > 25 24 24   GLUCOSE 162*  < > 135* 140* 135*  BUN 12  < > 9 8 11   CREATININE 0.82  < > 0.77 0.76 0.96  CALCIUM 8.4*  < > 8.2* 8.2* 8.7*  PROT 4.9*  --   --   --   --   ALBUMIN 2.4*  --   --   --   --   AST 41  --   --   --   --   ALT 49  --   --   --   --   ALKPHOS 69  --   --   --   --   BILITOT 0.4  --   --   --   --   GFRNONAA >60  < > >60 >60 57*  GFRAA >60  < > >60 >60 >60  ANIONGAP 8  < > 9 8 10   < > = values in this interval not displayed.   Hematology Recent Labs Lab 07/07/17 0335 07/08/17 0317 07/09/17 0424  WBC 13.9* 14.0* 13.6*  RBC 3.76* 3.81* 3.93  HGB 11.2* 11.4* 11.7*  HCT 33.5* 33.8* 35.2*  MCV 89.1 88.7 89.6  MCH 29.8 29.9 29.8  MCHC 33.4 33.7 33.2  RDW 13.4 13.5 13.7  PLT 332 347 372    Cardiac EnzymesNo results for input(s): TROPONINI in the last 168 hours. No results for input(s): TROPIPOC in the last 168 hours.   BNPNo results for  input(s): BNP, PROBNP in the last 168 hours.   DDimer No results for input(s): DDIMER in the last 168 hours.   Radiology    Dg Swallowing Func-speech Pathology  Result Date: 07/07/2017 Objective Swallowing Evaluation: Type of Study: MBS-Modified Barium Swallow Study Patient Details Name: EVELENE ROUSSIN MRN: [id number] Date of Birth: 05-25-1941 Today's Date: 07/07/2017 Time: SLP Start Time (ACUTE ONLY): 1330-SLP Stop Time (ACUTE ONLY): 1350 SLP Time Calculation (min) (ACUTE ONLY): 20 min Past Medical History: Past Medical History: Diagnosis Date . Atrial fibrillation (HCC)  . Diabetes mellitus without complication (HCC)  . High cholesterol  . Hypertension  . Vertigo  Past Surgical History: Past Surgical History: Procedure Laterality Date . ABDOMINAL HYSTERECTOMY   . CHOLECYSTECTOMY   . CYST EXCISION   HPI: Yevette L Dameronis a 76 y.o.femalewith medical history significant of HTN, HLD, DM, vertigo and recent (6/4-5) admission for new-onset afib and hyponatremia presenting with altered mental status. 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 Diagnosed with acute metabolic encephalopathy, A-fib, acute on chronic renal failure, sepsis (urine source vs possible aspiration). CXR 7/5 aortic atherosclerosis. No edema or consolidation. No Data Recorded Assessment / Plan / Recommendation CHL IP CLINICAL IMPRESSIONS 07/07/2017 Clinical Impression Pt mildly drowsy during MBS however alert enough for MBS. Oral holding and delayed transit likely due to cognitive impairments versus a true oral dysphagia. Holding boluses with delayed oral transit and unable to effecitvely masticate solid texture which was expectorated. One episode of flash penetration with thin with multiple cup and straw sips. Mild vallecular and pyriform sinus residue cleared with cues for second swallow. Recommend thin liquids, and puree (D1) and thin liquids, small  sips straw allowed, crush meds, full supervision, sit upright and eat when alert.  SLP Visit Diagnosis Dysphagia, pharyngeal phase (R13.13)  Attention and concentration deficit following -- Frontal lobe and executive function deficit following -- Impact on safety and function Mild aspiration risk   CHL IP TREATMENT RECOMMENDATION 07/07/2017 Treatment Recommendations Therapy as outlined in treatment plan below   Prognosis 07/07/2017 Prognosis for Safe Diet Advancement Good Barriers to Reach Goals -- Barriers/Prognosis Comment -- CHL IP DIET RECOMMENDATION 07/07/2017 SLP Diet Recommendations Dysphagia 1 (Puree) solids;Thin liquid Liquid Administration via Cup;Straw Medication Administration Crushed with puree Compensations Slow rate;Minimize environmental distractions;Small sips/bites Postural Changes Seated upright at 90 degrees   CHL IP OTHER RECOMMENDATIONS 07/07/2017 Recommended Consults -- Oral Care Recommendations Oral care BID Other Recommendations --   CHL IP FOLLOW UP RECOMMENDATIONS 07/07/2017 Follow up Recommendations (No Data)   CHL IP FREQUENCY AND DURATION 07/07/2017 Speech Therapy Frequency (ACUTE ONLY) min 2x/week Treatment Duration 2 weeks      CHL IP ORAL PHASE 07/07/2017 Oral Phase Impaired Oral - Pudding Teaspoon -- Oral - Pudding Cup -- Oral - Honey Teaspoon -- Oral - Honey Cup -- Oral - Nectar Teaspoon -- Oral - Nectar Cup Delayed oral transit;Holding of bolus Oral - Nectar Straw -- Oral - Thin Teaspoon -- Oral - Thin Cup Holding of bolus;Delayed oral transit Oral - Thin Straw -- Oral - Puree -- Oral - Mech Soft -- Oral - Regular Impaired mastication Oral - Multi-Consistency Delayed oral transit Oral - Pill -- Oral Phase - Comment --  CHL IP PHARYNGEAL PHASE 07/07/2017 Pharyngeal Phase Impaired Pharyngeal- Pudding Teaspoon -- Pharyngeal -- Pharyngeal- Pudding Cup -- Pharyngeal -- Pharyngeal- Honey Teaspoon -- Pharyngeal -- Pharyngeal- Honey Cup -- Pharyngeal -- Pharyngeal- Nectar Teaspoon -- Pharyngeal --  Pharyngeal- Nectar Cup Pharyngeal residue - valleculae;Pharyngeal residue - pyriform;Reduced epiglottic inversion;Reduced laryngeal elevation Pharyngeal -- Pharyngeal- Nectar Straw -- Pharyngeal -- Pharyngeal- Thin Teaspoon -- Pharyngeal -- Pharyngeal- Thin Cup Pharyngeal residue - valleculae;Pharyngeal residue - pyriform;Reduced laryngeal elevation;Reduced epiglottic inversion Pharyngeal -- Pharyngeal- Thin Straw Pharyngeal residue - valleculae Pharyngeal -- Pharyngeal- Puree -- Pharyngeal -- Pharyngeal- Mechanical Soft -- Pharyngeal -- Pharyngeal- Regular (No Data) Pharyngeal -- Pharyngeal- Multi-consistency Pharyngeal residue - valleculae;Reduced epiglottic inversion Pharyngeal -- Pharyngeal- Pill -- Pharyngeal -- Pharyngeal Comment --  CHL IP CERVICAL ESOPHAGEAL PHASE 07/07/2017 Cervical Esophageal Phase WFL Pudding Teaspoon -- Pudding Cup -- Honey Teaspoon -- Honey Cup -- Nectar Teaspoon -- Nectar Cup -- Nectar Straw -- Thin Teaspoon -- Thin Cup -- Thin Straw -- Puree -- Mechanical Soft -- Regular -- Multi-consistency -- Pill -- Cervical Esophageal Comment -- No flowsheet data found. Royce Macadamia 07/07/2017, 3:36 PM      [name] [name].Ed CCC-SLP Pager [phone]          Cardiac Studies   Echo 05/31/17: Study Conclusions  - Left ventricle: The cavity size was normal. Wall thickness was at   the upper limits of normal. Systolic function was normal. The   estimated ejection fraction was in the range of 60% to 65%. Wall   motion was normal; there were no regional wall motion   abnormalities. The study is not technically sufficient to allow   evaluation of LV diastolic function. - Aortic valve: Mildly calcified annulus. Trileaflet. - Mitral valve: Calcified annulus. There was mild regurgitation. - Left atrium: The atrium was mildly to moderately dilated. - Right atrium: Central venous pressure (est): 8 mm Hg. - Atrial septum: No defect or patent foramen ovale was identified. -  Tricuspid valve: There was trivial regurgitation. - Pulmonary arteries: PA peak pressure: 30 mm Hg (S). - Pericardium,  extracardiac: There was no pericardial effusion.  Impressions:  - Upper normal LV wall thickness with LVEF 60-65%. Indeterminate   diastolic function. Mild to moderate left atrial enlargement.   Mildly calcified mitral annulus with mild mitral regurgitation.   Mildly sclerotic aortic valve. Trivial tricuspid regurgitation   with PASP estimated 30 mmHg.  Patient Profile     76 y.o. female admitted with hypertension, hyperlipidemia, and diabetes here with atrial fibrillation with rapid ventricular response in the setting of sepsis with urinary source and acute metabolic encephalopathy.  Assessment & Plan    # Persistent atrial fibrillation: Rates are Still not under ideal control. She has ranged in the 80s-110s.  Her blood pressure is low normal.  Continue loading with oral amiodarone. We will increase metoprolol to 50 mg twice daily.  # Demand ischemia:  Troponin was mildly elevated with a peak of 1.64.  She denies chest pain. No ischemia evaluation at this time.   # LE Edema: Dopplers were negative for DVT   Signed, Chilton Si, MD  07/09/2017, 12:32 PM

## 2017-07-09 NOTE — Progress Notes (Signed)
Patient transferring to 5W. Report called to receiving nurse Loren. All questions answered. Patient's sister at bedside and notified of transfer/room number. Patient's glasses/case transferred with patient.

## 2017-07-09 NOTE — Progress Notes (Signed)
NURSING PROGRESS NOTE  Meridee ScoreFannie L Matsunaga [id number] Transfer Data: 07/09/2017 6:23 PM Attending Provider: Osvaldo ShipperKrishnan, Gokul, MD PCP:Kim, Fayrene FearingJames, MD Code Status: DNR   Val RilesFannie L Ahrendt is a 76 y.o. female patient [place] [address] -No acute distress noted.  -No complaints of shortness of breath.  -No complaints of chest pain.   Cardiac Monitoring: Box # 31 in place. Cardiac monitor yields: afib  Blood pressure 126/83, pulse 95, temperature 98 F (36.7 C), temperature source Oral, resp. rate 15, height 5\' 5"  (1.651 m), weight 92.3 kg (203 lb 7.8 oz), SpO2 96 %.   IV Fluids:  IV in place, occlusive dsg intact without redness, IV PICC triple lumen RUE D5 20 K+ 10 cc hr.   Allergies:  Patient has no known allergies.  Past Medical History:   has a past medical history of Atrial fibrillation (HCC); Diabetes mellitus without complication (HCC); High cholesterol; Hypertension; and Vertigo.  Past Surgical History:   has a past surgical history that includes Abdominal hysterectomy; Cyst excision; and Cholecystectomy.  Social History:   reports that she has never smoked. She has never used smokeless tobacco. She reports that she does not drink alcohol or use drugs.  Skin: intact  Patient not responding however. Information packet given to patient. Admission inpatient armband information verified with patient to include name and date of birth and placed on patient arm. Side rails up x 2, fall assessment and education completed with patient/family. Patient unable to verbalize understanding of risk associated with falls or verbalize understanding to call for assistance before getting out of bed. Call light within reach. Patient/famly unable to voice and demonstrate understanding of unit orientation instructions.    Will continue to evaluate and treat per MD orders.

## 2017-07-09 NOTE — Progress Notes (Signed)
Patient refused to take her PO medications tonight, they were crushed and put in applesauce and the patient refused to open her mouth to take the medications.  Will continue to monitor.  Macarthur CritchleyMarie Luverne Zerkle, RN

## 2017-07-10 DIAGNOSIS — I482 Chronic atrial fibrillation: Secondary | ICD-10-CM

## 2017-07-10 LAB — BASIC METABOLIC PANEL
Anion gap: 9 (ref 5–15)
BUN: 13 mg/dL (ref 6–20)
CHLORIDE: 101 mmol/L (ref 101–111)
CO2: 24 mmol/L (ref 22–32)
CREATININE: 0.92 mg/dL (ref 0.44–1.00)
Calcium: 8.7 mg/dL — ABNORMAL LOW (ref 8.9–10.3)
GFR calc non Af Amer: 60 mL/min — ABNORMAL LOW (ref >60)
Glucose, Bld: 128 mg/dL — ABNORMAL HIGH (ref 65–99)
Potassium: 3.8 mmol/L (ref 3.5–5.1)
SODIUM: 134 mmol/L — AB (ref 135–145)

## 2017-07-10 LAB — GLUCOSE, CAPILLARY
GLUCOSE-CAPILLARY: 141 mg/dL — AB (ref 65–99)
GLUCOSE-CAPILLARY: 142 mg/dL — AB (ref 65–99)
Glucose-Capillary: 117 mg/dL — ABNORMAL HIGH (ref 65–99)
Glucose-Capillary: 129 mg/dL — ABNORMAL HIGH (ref 65–99)
Glucose-Capillary: 114 mg/dL — ABNORMAL HIGH (ref 65–99)
Glucose-Capillary: 132 mg/dL — ABNORMAL HIGH (ref 65–99)

## 2017-07-10 LAB — MAGNESIUM: MAGNESIUM: 1.7 mg/dL (ref 1.7–2.4)

## 2017-07-10 MED ORDER — METRONIDAZOLE 500 MG PO TABS: 500.0000 mg | ORAL_TABLET | Freq: Three times a day (TID) | ORAL | Status: DC

## 2017-07-10 NOTE — Progress Notes (Signed)
Progress Note  Patient Name: Deborah Benjamin Date of Encounter: 07/10/2017  Primary Cardiologist: TT/SKo     Patient Profile   76 y.o. female with a hx of atrial fib with hospitalization 05/30/17 through 05/31/2017 and hx of HTN, HLD, DM, hypomagnesium, seen 06/30/17 with hospitalization for a fib RVR and urosepsis, and [facility] Seen by Cardiology for evaluation of atrial fibat the request of Dr. Izola PriceMyers.   ECGs were reviewed back to 2014   atrial fibrillation was first detected June 2018.  She has been treated with amiodarone and apixoban . She's also been found to have C. Difficile.    Thromboembolic risk factors ( age  -, HTN-1, , DM-1, Gender-1) for a CHADSVASc Score of 5   Subjective   Few comments in response to questions   Inpatient Medications    Scheduled Meds: . amiodarone  200 mg Oral BID  . apixaban  5 mg Oral BID  . chlorhexidine  15 mL Mouth Rinse BID  . insulin aspart  0-9 Units Subcutaneous Q4H  . mouth rinse  15 mL Mouth Rinse q12n4p  . metoprolol tartrate  50 mg Oral BID  . nystatin   Topical TID  . sodium chloride flush  10-40 mL Intracatheter Q12H  . vancomycin  500 mg Oral Q6H   Continuous Infusions: . dextrose 5 % with KCl 20 mEq / L 20 mEq (07/08/17 1232)   PRN Meds: acetaminophen, acetaminophen, LORazepam, ondansetron (ZOFRAN) IV, sodium chloride flush   Vital Signs    Vitals:   07/09/17 1330 07/09/17 1858 07/09/17 2032 07/10/17 0603  BP: 126/83 (!) 152/88 140/87 (!) 149/97  Pulse: 95 (!) 44 (!) 102 80  Resp:  20 18 18   Temp:  98.1 F (36.7 C) 98.2 F (36.8 C)   TempSrc:  Oral    SpO2:  98% 98% 98%  Weight:      Height:        Intake/Output Summary (Last 24 hours) at 07/10/17 1359 Last data filed at 07/10/17 [id number]  Gross per 24 hour  Intake           771.17 ml  Output                0 ml  Net           771.17 ml   Filed Weights   07/02/17 0400 07/03/17 0500 07/03/17 1500    Weight: 206 lb 2.1 oz (93.5 kg) 206 lb 12.7 oz (93.8 kg) 203 lb 7.8 oz (92.3 kg)    Telemetry    Afib rate about 100- Personally Reviewed  ECG       Physical Exam    GEN: No acute distress.   Neck: JVD flat Cardiac: irregularly irregular rhythm  Respiratory: Clear to auscultation bilaterally. GI: Soft, nontender, non-distended  MS:  edema; No deformity. Neuro:  Nonfocal  Psych: flat and poorly responsive  Skin Warm and dry   Labs    Chemistry Recent Labs Lab 07/04/17 0211  07/08/17 0317 07/09/17 0424 07/10/17 0543  NA 143  < > 132* 135 134*  K 3.1*  < > 3.4* 3.6 3.8  CL 112*  < > 100* 101 101  CO2 23  < > 24 24 24   GLUCOSE 162*  < > 140* 135* 128*  BUN 12  < > 8 11 13   CREATININE 0.82  < > 0.76 0.96 0.92  CALCIUM 8.4*  < > 8.2* 8.7*  8.7*  PROT 4.9*  --   --   --   --   ALBUMIN 2.4*  --   --   --   --   AST 41  --   --   --   --   ALT 49  --   --   --   --   ALKPHOS 69  --   --   --   --   BILITOT 0.4  --   --   --   --   GFRNONAA >60  < > >60 57* 60*  GFRAA >60  < > >60 >60 >60  ANIONGAP 8  < > 8 10 9   < > = values in this interval not displayed.   Hematology Recent Labs Lab 07/07/17 0335 07/08/17 0317 07/09/17 0424  WBC 13.9* 14.0* 13.6*  RBC 3.76* 3.81* 3.93  HGB 11.2* 11.4* 11.7*  HCT 33.5* 33.8* 35.2*  MCV 89.1 88.7 89.6  MCH 29.8 29.9 29.8  MCHC 33.4 33.7 33.2  RDW 13.4 13.5 13.7  PLT 332 347 372    Cardiac EnzymesNo results for input(s): TROPONINI in the last 168 hours. No results for input(s): TROPIPOC in the last 168 hours.   BNPNo results for input(s): BNP, PROBNP in the last 168 hours.   DDimer No results for input(s): DDIMER in the last 168 hours.   Radiology    No results found.  Cardiac Studies    Echocardiogram 6/15 demonstrated normal LV function mild LAE     Assessment & Plan    Atrial fibrillation  Persistent will increase metoprolol 75 bid Will follow   Signed, Sherryl Manges, MD  07/10/2017, 1:59 PM

## 2017-07-10 NOTE — Progress Notes (Signed)
[facility] PROGRESS NOTE  Deborah Benjamin ZOX:[id number] DOB: 1941/10/30 DOA: 06/29/2017  PCP: Pearson Grippe, MD  Brief History/Interval Summary: 76 year old Caucasian female with a past medical history of hypertension, hyperlipoidemia, diabetes, recently diagnosed atrial fibrillation, presented with altered mental status. Patient was noted to be in atrial fibrillation with RVR. There was also concern for UTI and the patient was treated with antibiotics. Patient remained encephalopathic. So she was transferred over from [facility] to [facility]. Seen by neurology and cardiology. Currently on amiodarone. Subsequently also found to have positive C. Difficile. Patient's mental status has been very slow to improve.  Reason for Visit: Acute encephalopathy. Atrial fibrillation.  Consultants: Cardiology. Palliative medicine. Neurology.  Procedures:  EEG Impression: This EEG is abnormal due to moderate diffuse slowing of the waking background with triphasic waves seen. Clinical Correlation of the above findings indicates diffuse cerebral dysfunction that is non-specific in etiology and can be seen with hypoxic/ischemic injury, toxic/metabolic encephalopathies, or medication effect. Triphasic waves are typically seen with hepatic encephalopathy, but may be seen with other metabolic encephalopathies as well.  The absence of epileptiform discharges does not rule out a clinical diagnosis of epilepsy.  Clinical correlation is advised.  Antibiotics: Initially on vancomycin and Zosyn. Currently on oral vancomycin and IV Flagyl Will stop Flagyl. Continue only the vancomycin  Subjective/Interval History: Patient not very communicative. About the same as the last few days. Diarrhea has resolved according to nurse.  ROS: Denies any pain  Objective:  Vital Signs  Vitals:   07/09/17 1330 07/09/17 1858 07/09/17 2032 07/10/17 0603  BP: 126/83 (!) 152/88 140/87 (!) 149/97  Pulse:  95 (!) 44 (!) 102 80  Resp:  20 18 18   Temp:  98.1 F (36.7 C) 98.2 F (36.8 C)   TempSrc:  Oral    SpO2:  98% 98% 98%  Weight:      Height:        Intake/Output Summary (Last 24 hours) at 07/10/17 1022 Last data filed at 07/10/17 8119  Gross per 24 hour  Intake           771.17 ml  Output                0 ml  Net           771.17 ml   Filed Weights   07/02/17 0400 07/03/17 0500 07/03/17 1500  Weight: 93.5 kg (206 lb 2.1 oz) 93.8 kg (206 lb 12.7 oz) 92.3 kg (203 lb 7.8 oz)    General appearance: Awake, alert. Poorly responsive at times. No distress. Resp: Clear to auscultation bilaterally Cardio: S1, S2 is irregularly irregular. No S3, S4. Telemetry shows improvement in heart rate. GI: Abdomen is soft. Nontender, nondistended. Bowel sounds are present. No masses or organomegaly. Extremities: No edema Neurologic: No neurological deficits appreciated. Poor effort overall  Lab Results:  Data Reviewed: I have personally reviewed following labs and imaging studies  CBC:  Recent Labs Lab 07/05/17 0540 07/06/17 0424 07/07/17 0335 07/08/17 0317 07/09/17 0424  WBC 13.6* 13.3* 13.9* 14.0* 13.6*  HGB 10.5* 11.0* 11.2* 11.4* 11.7*  HCT 32.1* 33.3* 33.5* 33.8* 35.2*  MCV 90.7 89.5 89.1 88.7 89.6  PLT 307 330 332 347 372    Basic Metabolic Panel:  Recent Labs Lab 07/06/17 0424 07/07/17 0335 07/08/17 0317 07/08/17 1428 07/09/17 0424 07/10/17 0543  NA 133* 136 132*  --  135 134*  K 3.4* 3.3* 3.4*  --  3.6 3.8  CL 103 102 100*  --  101 101  CO2 23 25 24   --  24 24  GLUCOSE 146* 135* 140*  --  135* 128*  BUN 10 9 8   --  11 13  CREATININE 0.72 0.77 0.76  --  0.96 0.92  CALCIUM 8.3* 8.2* 8.2*  --  8.7* 8.7*  MG 1.2*  --   --  1.2* 1.7 1.7    GFR: Estimated Creatinine Clearance: 60.2 mL/min (by C-G formula based on SCr of 0.92 mg/dL).  Liver Function Tests:  Recent Labs Lab 07/04/17 0211  AST 41  ALT 49  ALKPHOS 69  BILITOT 0.4  PROT 4.9*  ALBUMIN 2.4*      CBG:  Recent Labs Lab 07/09/17 1613 07/09/17 2025 07/10/17 0007 07/10/17 0425 07/10/17 0826  GLUCAP 138* 150* 142* 117* 129*     Recent Results (from the past 240 hour(s))  MRSA PCR Screening     Status: None   Collection Time: 07/03/17  2:29 PM  Result Value Ref Range Status   MRSA by PCR NEGATIVE NEGATIVE Final    Comment:        The GeneXpert MRSA Assay (FDA approved for NASAL specimens only), is one component of a comprehensive MRSA colonization surveillance program. It is not intended to diagnose MRSA infection nor to guide or monitor treatment for MRSA infections.   C difficile quick scan w PCR reflex     Status: Abnormal   Collection Time: 07/03/17  5:27 PM  Result Value Ref Range Status   C Diff antigen POSITIVE (A) NEGATIVE Final   C Diff toxin NEGATIVE NEGATIVE Final   C Diff interpretation Results are indeterminate. See PCR results.  Final  Clostridium Difficile by PCR     Status: Abnormal   Collection Time: 07/03/17  5:27 PM  Result Value Ref Range Status   Toxigenic C Difficile by pcr POSITIVE (A) NEGATIVE Final    Comment: Positive for toxigenic C. difficile with little to no toxin production. Only treat if clinical presentation suggests symptomatic illness.      Radiology Studies: No results found.   Medications:  Scheduled: . amiodarone  200 mg Oral BID  . apixaban  5 mg Oral BID  . chlorhexidine  15 mL Mouth Rinse BID  . insulin aspart  0-9 Units Subcutaneous Q4H  . mouth rinse  15 mL Mouth Rinse q12n4p  . metoprolol tartrate  50 mg Oral BID  . nystatin   Topical TID  . sodium chloride flush  10-40 mL Intracatheter Q12H  . vancomycin  500 mg Oral Q6H   Continuous: . dextrose 5 % with KCl 20 mEq / L 20 mEq (07/08/17 1232)  . metronidazole Stopped (07/10/17 1610)   RUE:[id number], acetaminophen, LORazepam, ondansetron (ZOFRAN) IV, sodium chloride flush  Assessment/Plan:  Principal Problem:   Acute encephalopathy Active  Problems:   Essential hypertension   Diabetes mellitus type 2 in obese [facility])   Atrial fibrillation with RVR (HCC)   Acute renal failure superimposed on stage 3 chronic kidney disease (HCC)   Urinary tract infection without hematuria   Palliative care encounter   Goals of care, counseling/discussion    Acute metabolic encephalopathy/tremor/rhythmic movements Etiology thought to be multifactorial including infection in the form of UTI, hyponatremia, C. difficile, acute renal failure and atrial fibrillation. Patient has been very slow to improve, but she has run slight improvement over the last few days. Patient underwent imaging studies in the form of CT scan and MRI  brain which have not revealed any clear cut reason. Patient also underwent EEG which did not show any epileptiform activity. Patient has been seen by neurology. Vitamin B-12 and ammonia levels were normal. No further testing is thought to be necessary. Palliative medicine was consulted and they are following. Seen by speech therapy. Approved for dysphagia 1 diet. Aspiration precautions.   Atrial fibrillation with RVR/elevated troponin/demand ischemia Chads2vasc score is 5. Patient is being seen by cardiology. Patient was initially placed on amiodarone infusion and IV heparin. She was also on IV metoprolol. Now on oral amiodarone and metoprolol. On Eliquis. Heart rate is reasonably well controlled.  C. difficile colitis. Stable. Diarrhea has resolved. Continue oral vancomycin. Discontinue Flagyl.   Acute on chronic kidney disease stage III. Renal function close to baseline.  Urinary tract infection. No growth noted on urine cultures. Antibiotics were discontinued.  Hypernatremia/hypokalemia/hypomagnesemia Sodium level was normal. Potassium has improved. Magnesium level was normal.   Essential hypertension. Stable.  Diabetes mellitus type 2. Holding her metformin. Last HbA1c was 6.6. Continue SSI. Continue to monitor  CBGs.  DVT Prophylaxis: Eliquis  Code Status: DO NOT RESUSCITATE Family Communication: No family at bedside Disposition Plan: Management as outlined above. Looks like patient will need to go to a skilled nursing facility for rehabilitation. Mental status remains without much improvement, but this could take a long time to get back to baseline.    LOS: 11 days   [facility] HospitalKRISHNAN,Jailyn Langhorst  [facility] Pager [phone] 07/10/2017, 10:22 AM  If 7PM-7AM, please contact night-coverage at [email or web address], password [email or web address]

## 2017-07-10 NOTE — Discharge Instructions (Addendum)
Atrial Fibrillation Atrial fibrillation is a type of heartbeat that is irregular or fast (rapid). If you have this condition, your heart keeps quivering in a weird (chaotic) way. This condition can make it so your heart cannot pump blood normally. Having this condition gives a person more risk for stroke, heart failure, and other heart problems. There are different types of atrial fibrillation. Talk with your doctor to learn about the type that you have. Follow these instructions at home:  Take over-the-counter and prescription medicines only as told by your doctor.  If your doctor prescribed a blood-thinning medicine, take it exactly as told. Taking too much of it can cause bleeding. If you do not take enough of it, you will not have the protection that you need against stroke and other problems.  Do not use any tobacco products. These include cigarettes, chewing tobacco, and e-cigarettes. If you need help quitting, ask your doctor.  If you have apnea (obstructive sleep apnea), manage it as told by your doctor.  Do not drink alcohol.  Do not drink beverages that have caffeine. These include coffee, soda, and tea.  Maintain a healthy weight. Do not use diet pills unless your doctor says they are safe for you. Diet pills may make heart problems worse.  Follow diet instructions as told by your doctor.  Exercise regularly as told by your doctor.  Keep all follow-up visits as told by your doctor. This is important. Contact a doctor if:  You notice a change in the speed, rhythm, or strength of your heartbeat.  You are taking a blood-thinning medicine and you notice more bruising.  You get tired more easily when you move or exercise. Get help right away if:  You have pain in your chest or your belly (abdomen).  You have sweating or weakness.  You feel sick to your stomach (nauseous).  You notice blood in your throw up (vomit), poop (stool), or pee (urine).  You are short of  breath.  You suddenly have swollen feet and ankles.  You feel dizzy.  Your suddenly get weak or numb in your face, arms, or legs, especially if it happens on one side of your body.  You have trouble talking, trouble understanding, or both.  Your face or your eyelid droops on one side. These symptoms may be an emergency. Do not wait to see if the symptoms will go away. Get medical help right away. Call your local emergency services (911 in the [place] Do not drive yourself to the hospital. This information is not intended to replace advice given to you by your health care provider. Make sure you discuss any questions you have with your health care provider. Document Released: 09/21/2008 Document Revised: 05/20/2016 Document Reviewed: 04/09/2015 Elsevier Interactive Patient Education  2018 [employer].   Dysphagia Dysphagia is trouble swallowing. This condition occurs when solids and liquids stick in a person's throat on the way down to the stomach, or when food takes longer to get to the stomach. You may have problems swallowing food, liquids, or both. You may also have pain while trying to swallow. It may take you more time and effort to swallow something. What are the causes? This condition is caused by:  Problems with the muscles. They may make it difficult for you to move food and liquids through the tube that connects your mouth to your stomach (esophagus). You may have ulcers, scar tissue, or inflammation that blocks the normal passage of food and liquids. Causes of these  problems include: ? Acid reflux from your stomach into your esophagus (gastroesophageal reflux). ? Infections. ? Radiation treatment for cancer. ? Medicines taken without enough fluids to wash them down into your stomach.  Nerve problems. These prevent signals from being sent to the muscles of your esophagus to squeeze (contract) and move what you swallow down to your stomach.  Globus pharyngeus. This is a common  problem that involves feeling like something is stuck in the throat or a sense of trouble with swallowing even though nothing is wrong with the swallowing passages.  Stroke. This can affect the nerves and make it difficult to swallow.  Certain conditions, such as cerebral palsy or Parkinson disease.  What are the signs or symptoms? Common symptoms of this condition include:  A feeling that solids or liquids are stuck in your throat on the way down to the stomach.  Food taking too long to get to the stomach.  Other symptoms include:  Food moving back from your stomach to your mouth (regurgitation).  Noises coming from your throat.  Chest discomfort with swallowing.  A feeling of fullness when swallowing.  Drooling, especially when the throat is blocked.  Pain while swallowing.  Heartburn.  Coughing or gagging while trying to swallow.  How is this diagnosed? This condition is diagnosed by:  Barium X-ray. In this test, you swallow a white substance (contrast medium)that sticks to the inside of your esophagus. X-ray images are then taken.  Endoscopy. In this test, a flexible telescope is inserted down your throat to look at your esophagus and your stomach.  CT scans and MRI.  How is this treated? Treatment for dysphagia depends on the cause of the condition:  If the dysphagia is caused by acid reflux or infection, medicines may be used. They may include antibiotics and heartburn medicines.  If the dysphagia is caused by problems with your muscles, swallowing therapy may be used to help you strengthen your swallowing muscles. You may have to do specific exercises to strengthen the muscles or stretch them.  If the dysphagia is caused by a blockage or mass, procedures to remove the blockage may be done. You may need surgery and a feeding tube.  You may need to make diet changes. Ask your health care provider for specific instructions. Follow these instructions at  home: Eating and drinking  Try to eat soft food that is easier to swallow.  Follow any diet changes as told by your health care provider.  Cut your food into small pieces and eat slowly.  Eat and drink only when you are sitting upright.  Do not drink alcohol or caffeine. If you need help quitting, ask your health care provider. General instructions  Check your weight every day to make sure you are not losing weight.  Take over-the-counter and prescription medicines only as told by your health care provider.  If you were prescribed an antibiotic medicine, take it as told by your health care provider. Do not stop taking the antibiotic even if you start to feel better.  Do not use any products that contain nicotine or tobacco, such as cigarettes and e-cigarettes. If you need help quitting, ask your health care provider.  Keep all follow-up visits as told by your health care provider. This is important. Contact a health care provider if:  You lose weight because you cannot swallow.  You cough when you drink liquids (aspiration).  You cough up partially digested food. Get help right away if:  You cannot  swallow your saliva.  You have shortness of breath or a fever, or both.  You have a hoarse voice and also have trouble swallowing. Summary  Dysphagia is trouble swallowing. This condition occurs when solids and liquids stick in a person's throat on the way down to the stomach, or when food takes longer to get to the stomach.  Dysphagia has many possible causes and symptoms.  Treatment for dysphagia depends on the cause of the condition. This information is not intended to replace advice given to you by your health care provider. Make sure you discuss any questions you have with your health care provider. Document Released: 12/10/2000 Document Revised: 12/02/2016 Document Reviewed: 12/02/2016 Elsevier Interactive Patient Education  2017 [employer].   Information on my  medicine - ELIQUIS (apixaban)  This medication education was reviewed with me or my healthcare representative as part of my discharge preparation.   Why was Eliquis prescribed for you? Eliquis was prescribed for you to reduce the risk of a blood clot forming that can cause a stroke if you have a medical condition called atrial fibrillation (a type of irregular heartbeat).  What do You need to know about Eliquis ? Take your Eliquis TWICE DAILY - one tablet in the morning and one tablet in the evening with or without food. If you have difficulty swallowing the tablet whole please discuss with your pharmacist how to take the medication safely.  Take Eliquis exactly as prescribed by your doctor and DO NOT stop taking Eliquis without talking to the doctor who prescribed the medication.  Stopping may increase your risk of developing a stroke.  Refill your prescription before you run out.  After discharge, you should have regular check-up appointments with your healthcare provider that is prescribing your Eliquis.  In the future your dose may need to be changed if your kidney function or weight changes by a significant amount or as you get older.  What do you do if you miss a dose? If you miss a dose, take it as soon as you remember on the same day and resume taking twice daily.  Do not take more than one dose of ELIQUIS at the same time to make up a missed dose.  Important Safety Information A possible side effect of Eliquis is bleeding. You should call your healthcare provider right away if you experience any of the following: ? Bleeding from an injury or your nose that does not stop. ? Unusual colored urine (red or dark brown) or unusual colored stools (red or black). ? Unusual bruising for unknown reasons. ? A serious fall or if you hit your head (even if there is no bleeding).  Some medicines may interact with Eliquis and might increase your risk of bleeding or clotting while on  Eliquis. To help avoid this, consult your healthcare provider or pharmacist prior to using any new prescription or non-prescription medications, including herbals, vitamins, non-steroidal anti-inflammatory drugs (NSAIDs) and supplements.  This website has more information on Eliquis (apixaban): http://www.eliquis.com/eliquis/home

## 2017-07-10 NOTE — Progress Notes (Signed)
Patient requires encouragement and prompting when taking her medications crushed in applesauce.  Patient has to be instructed to swallow.  Will continue to monitor.

## 2017-07-11 LAB — GLUCOSE, CAPILLARY
GLUCOSE-CAPILLARY: 125 mg/dL — AB (ref 65–99)
GLUCOSE-CAPILLARY: 130 mg/dL — AB (ref 65–99)
GLUCOSE-CAPILLARY: 126 mg/dL — AB (ref 65–99)
GLUCOSE-CAPILLARY: 116 mg/dL — AB (ref 65–99)
GLUCOSE-CAPILLARY: 140 mg/dL — AB (ref 65–99)

## 2017-07-11 MED ORDER — METOPROLOL TARTRATE 5 MG/5ML IV SOLN: 5.0000 mg | Freq: Four times a day (QID) | INTRAVENOUS | Status: DC | PRN

## 2017-07-11 MED ORDER — VITAMIN B-1 100 MG PO TABS: 100.0000 mg | ORAL_TABLET | Freq: Every day | ORAL | Status: AC

## 2017-07-11 MED ORDER — AMIODARONE HCL 200 MG PO TABS: 200.0000 mg | ORAL_TABLET | Freq: Two times a day (BID) | ORAL | Status: DC

## 2017-07-11 MED ORDER — METOPROLOL TARTRATE 37.5 MG PO TABS: 75.0000 mg | ORAL_TABLET | Freq: Two times a day (BID) | ORAL | 0 refills | Status: AC

## 2017-07-11 MED ORDER — AMIODARONE HCL 200 MG PO TABS: ORAL_TABLET | ORAL | Status: AC

## 2017-07-11 MED ORDER — VANCOMYCIN 50 MG/ML ORAL SOLUTION: 500.0000 mg | Freq: Four times a day (QID) | ORAL | Status: AC

## 2017-07-11 MED ORDER — NYSTATIN 100000 UNIT/GM EX POWD: Freq: Three times a day (TID) | CUTANEOUS | 0 refills | Status: AC

## 2017-07-11 NOTE — Progress Notes (Signed)
Physical Therapy Treatment Patient Details Name: Deborah Benjamin MRN: [id number] DOB: 1941/10/17 Today's Date: 07/11/2017    History of Present Illness Pt transferred from [facility] with acute encephalopathy and afib with rvr. Pt also found to have cdiff. PMH - HTN, DM, vertigo    PT Comments    Participation limited today by pt.  LE exercises completed to decrease stiffness and help make pt less lethargic with limited results.  Sat EOB working on balance, sitting tolerance and attempting to get pt more alert.   Follow Up Recommendations  SNF     Equipment Recommendations  Other (comment)    Recommendations for Other Services       Precautions / Restrictions Precautions Precautions: Fall    Mobility  Bed Mobility Overal bed mobility: Needs Assistance Bed Mobility: Rolling;Sidelying to Sit;Sit to Supine Rolling: Total assist;+2 for safety/equipment Sidelying to sit: Total assist;+2 for physical assistance Supine to sit: Total assist     General bed mobility comments: pt needed cueing for sequencing and consistent direction  Transfers Overall transfer level: Needs assistance   Transfers: Sit to/from Stand Sit to Stand: Total assist;+2 physical assistance         General transfer comment: pt unable to attain standing and likely wasn't aroused enough to  attempt  Ambulation/Gait             General Gait Details: not able   Stairs            Wheelchair Mobility    Modified Rankin (Stroke Patients Only)       Balance Overall balance assessment: Needs assistance Sitting-balance support: Single extremity supported;Bilateral upper extremity supported Sitting balance-Leahy Scale: Fair Sitting balance - Comments: after a significant amout of time trying to make pt more alert and participative, she could sit EOB without assist.                                    Cognition Arousal/Alertness: Lethargic Behavior During Therapy: Flat  affect;WFL for tasks assessed/performed Overall Cognitive Status: Within Functional Limits for tasks assessed Area of Impairment: Orientation;Attention;Memory;Following commands;Safety/judgement;Problem solving                 Orientation Level: Situation;Time Current Attention Level: Selective Memory: Decreased recall of precautions;Decreased short-term memory Following Commands: Follows one step commands with increased time;Follows one step commands consistently Safety/Judgement: Decreased awareness of safety;Decreased awareness of deficits   Problem Solving: Slow processing;Decreased initiation;Requires verbal cues;Requires tactile cues General Comments: Only attained a modicum of arousal with sitting and trying to startle her awake.      Exercises Other Exercises Other Exercises: B UE PROM Other Exercises: bil LE PROM with heavy resistance tods    General Comments        Pertinent Vitals/Pain Pain Assessment: Faces Faces Pain Scale: Hurts little more Pain Location: joints with movement Pain Descriptors / Indicators: Grimacing;Guarding Pain Intervention(s): Monitored during session    Home Living                      Prior Function            PT Goals (current goals can now be found in the care plan section) Acute Rehab PT Goals Patient Stated Goal: Pt didn't state PT Goal Formulation: With patient Time For Goal Achievement: 07/21/17 Potential to Achieve Goals: Fair Progress towards PT goals: Progressing toward goals  Frequency    Min 3X/week      PT Plan Current plan remains appropriate    Co-evaluation              AM-PAC PT "6 Clicks" Daily Activity  Outcome Measure  Difficulty turning over in bed (including adjusting bedclothes, sheets and blankets)?: Total Difficulty moving from lying on back to sitting on the side of the bed? : Total Difficulty sitting down on and standing up from a chair with arms (e.g., wheelchair,  bedside commode, etc,.)?: Total Help needed moving to and from a bed to chair (including a wheelchair)?: Total Help needed walking in hospital room?: Total Help needed climbing 3-5 steps with a railing? : Total 6 Click Score: 6    End of Session   Activity Tolerance: Patient limited by lethargy Patient left: in bed;with call bell/phone within reach;with bed alarm set Nurse Communication: Mobility status PT Visit Diagnosis: Muscle weakness (generalized) (M62.81);Other abnormalities of gait and mobility (R26.89)     Time: [id number] PT Time Calculation (min) (ACUTE ONLY): 27 min  Charges:  $Therapeutic Activity: 23-37 mins                    G Codes:       07/11/2017  Harrison BingKen Deborah Benjamin, PT [phone]  (pager)   Eliseo GumKenneth V Lakai Moree 07/11/2017, 4:46 PM

## 2017-07-11 NOTE — Progress Notes (Signed)
Progress Note  Patient Name: Deborah Benjamin Date of Encounter: 07/11/2017  Primary Cardiologist: Dr. Purvis Sheffield  Subjective   Not very conversational. Refusing to take morning medications. Denies any current pain.   Inpatient Medications    Scheduled Meds: . amiodarone  200 mg Oral BID  . apixaban  5 mg Oral BID  . chlorhexidine  15 mL Mouth Rinse BID  . insulin aspart  0-9 Units Subcutaneous Q4H  . mouth rinse  15 mL Mouth Rinse q12n4p  . metoprolol tartrate  50 mg Oral BID  . nystatin   Topical TID  . sodium chloride flush  10-40 mL Intracatheter Q12H  . vancomycin  500 mg Oral Q6H   Continuous Infusions: . dextrose 5 % with KCl 20 mEq / L 20 mEq (07/08/17 1232)   PRN Meds: acetaminophen, acetaminophen, LORazepam, ondansetron (ZOFRAN) IV, sodium chloride flush   Vital Signs    Vitals:   07/10/17 0603 07/10/17 1617 07/10/17 2152 07/11/17 0427  BP: (!) 149/97 (!) 158/78 133/88 115/71  Pulse: 80 99 97 86  Resp: 18 20 19 18   Temp:  98.5 F (36.9 C) 98.2 F (36.8 C) (!) 97.4 F (36.3 C)  TempSrc:  Oral Oral Oral  SpO2: 98% 97% 94% 96%  Weight:      Height:        Intake/Output Summary (Last 24 hours) at 07/11/17 1029 Last data filed at 07/11/17 0427  Gross per 24 hour  Intake                0 ml  Output              575 ml  Net             -575 ml   Filed Weights   07/02/17 0400 07/03/17 0500 07/03/17 1500  Weight: 206 lb 2.1 oz (93.5 kg) 206 lb 12.7 oz (93.8 kg) 203 lb 7.8 oz (92.3 kg)    Telemetry    Atrial fibrillation, HR in 90's to low-100's. Occasional PVC's.  - Personally Reviewed  ECG    No new tracings.   Physical Exam   General: Well developed, elderly Caucasian female appearing in no acute distress. Head: Normocephalic, atraumatic.  Neck: Supple without bruits, JVD not elevated. Lungs:  Resp regular and unlabored, CTA anteriorly. Heart: Irregularly irregular, S1, S2, no S3, S4, or murmur; no rub. Abdomen: Soft, non-tender,  non-distended with normoactive bowel sounds. No hepatomegaly. No rebound/guarding. No obvious abdominal masses. Extremities: No clubbing, cyanosis, or lower extremity edema. Distal pedal pulses are 2+ bilaterally. Neuro: Responding "yes/no" to some questions. Moves all extremities spontaneously. Psych: Normal affect.  Labs    Chemistry Recent Labs Lab 07/08/17 0317 07/09/17 0424 07/10/17 0543  NA 132* 135 134*  K 3.4* 3.6 3.8  CL 100* 101 101  CO2 24 24 24   GLUCOSE 140* 135* 128*  BUN 8 11 13   CREATININE 0.76 0.96 0.92  CALCIUM 8.2* 8.7* 8.7*  GFRNONAA >60 57* 60*  GFRAA >60 >60 >60  ANIONGAP 8 10 9      Hematology Recent Labs Lab 07/07/17 0335 07/08/17 0317 07/09/17 0424  WBC 13.9* 14.0* 13.6*  RBC 3.76* 3.81* 3.93  HGB 11.2* 11.4* 11.7*  HCT 33.5* 33.8* 35.2*  MCV 89.1 88.7 89.6  MCH 29.8 29.9 29.8  MCHC 33.4 33.7 33.2  RDW 13.4 13.5 13.7  PLT 332 347 372    Cardiac EnzymesNo results for input(s): TROPONINI in the last 168 hours. No results  for input(s): TROPIPOC in the last 168 hours.   BNPNo results for input(s): BNP, PROBNP in the last 168 hours.   DDimer No results for input(s): DDIMER in the last 168 hours.   Radiology    No results found.  Cardiac Studies   Echocardiogram: 05/31/2017 Study Conclusions  - Left ventricle: The cavity size was normal. Wall thickness was at   the upper limits of normal. Systolic function was normal. The   estimated ejection fraction was in the range of 60% to 65%. Wall   motion was normal; there were no regional wall motion   abnormalities. The study is not technically sufficient to allow   evaluation of LV diastolic function. - Aortic valve: Mildly calcified annulus. Trileaflet. - Mitral valve: Calcified annulus. There was mild regurgitation. - Left atrium: The atrium was mildly to moderately dilated. - Right atrium: Central venous pressure (est): 8 mm Hg. - Atrial septum: No defect or patent foramen ovale was  identified. - Tricuspid valve: There was trivial regurgitation. - Pulmonary arteries: PA peak pressure: 30 mm Hg (S). - Pericardium, extracardiac: There was no pericardial effusion.  Impressions:  - Upper normal LV wall thickness with LVEF 60-65%. Indeterminate   diastolic function. Mild to moderate left atrial enlargement.   Mildly calcified mitral annulus with mild mitral regurgitation.   Mildly sclerotic aortic valve. Trivial tricuspid regurgitation   with PASP estimated 30 mmHg.  Patient Profile     76 y.o. female w/ PMH of HTN, HLD, and Type 2 DM admitted with Urosepsis and AMS. Cardiology consulted for atrial fibrillation with RVR.   Assessment & Plan    1. Persistent Atrial Fibrillation - HR has been elevated in the setting of Urosepsis. Remains on Lopressor 50mg  BID and oral Amiodarone load of 200mg  BID with HR in the 90's to low-100's which is acceptable given her multiple medical conditions. I am concerned she is not getting all of her PO medications as she is having difficulty swallowing medications. Will add IV Lopressor to use PRN for when she is not tolerating PO medications.  - This patients CHA2DS2-VASc Score and unadjusted Ischemic Stroke Rate (% per year) is equal to 4.8 % stroke rate/year from a score of 4 (HTN, DM, Female, Age). She remaines on Eliquis 5mg  BID for anticoagulation.    2. Elevated Troponin - cardiac enzymes peaked at 0.06. - she denies any recent anginal symptoms.  - no plans for ischemic evaluation at this time.   3. Stage 3 CKD - creatinine remains stable at 0.92 this AM.   4. Urosepsis/AMS - Per admitting team.  Signed, Ellsworth LennoxBrittany M Posey Jasmin , PA-C 10:29 AM 07/11/2017 Pager: [phone]

## 2017-07-11 NOTE — Progress Notes (Signed)
[facility] Anticipated DC date: 07/11/17 Family notified: Sister Transport by: Audie ClearPTAR 4pm   Per MD [facility]. RN, patient, patient's family, and facility notified of DC. Discharge Summary sent to facility. RN given number for report [phone]). DC packet on chart. Ambulance transport requested for patient.   CSW signing off.  Cristobal GoldmannNadia Talecia Sherlin, [place] Clinical Social Worker [phone]

## 2017-07-11 NOTE — Discharge Summary (Signed)
[facility]  Physician Discharge Summary   Patient ID: Deborah Benjamin MRN: [id number] DOB/AGE: November 02, 1941 76 y.o.  Admit date: 06/29/2017 Discharge date: 07/11/2017  PCP: Pearson GrippeKim, James, MD  DISCHARGE DIAGNOSES:  Principal Problem:   Acute encephalopathy Active Problems:   Essential hypertension   Diabetes mellitus type 2 in obese [facility](HCC)   Atrial fibrillation with RVR (HCC)   Acute renal failure superimposed on stage 3 chronic kidney disease (HCC)   Urinary tract infection without hematuria   Palliative care encounter   Goals of care, counseling/discussion   RECOMMENDATIONS FOR OUTPATIENT FOLLOW UP: 1. Patient should continue to be evaluated by speech therapy at skilled nursing facility 2. CBC and basic metabolic panel in 1 week 3. Palliative medicine consultation to continue at skilled nursing facility 4. She will need follow-up with cardiology the next 2-3 weeks. Cardiology will arrange.   DISCHARGE CONDITION: fair  Diet recommendation: Dysphagia 1 diet with thin liquids. Patient needs continued encouragement and assistance with feeding.  Filed Weights   07/02/17 0400 07/03/17 0500 07/03/17 1500  Weight: 93.5 kg (206 lb 2.1 oz) 93.8 kg (206 lb 12.7 oz) 92.3 kg (203 lb 7.8 oz)    INITIAL HISTORY: 76 year old Caucasian female with a past medical history of hypertension, hyperlipoidemia, diabetes, recently diagnosed atrial fibrillation, presented with altered mental status. Patient was noted to be in atrial fibrillation with RVR. There was also concern for UTI and the patient was treated with antibiotics. Patient remained encephalopathic. So she was [facility]. Seen by neurology and cardiology. Currently on amiodarone. Subsequently also found to have positive C. Difficile. Patient's mental status has been very slow to improve.  Consultants: Cardiology. Palliative medicine. Neurology.  Procedures:   EEG Impression: This EEG is abnormal due to moderatediffuse slowing of the waking background with triphasic waves seen. Clinical Correlation of the above findings indicates diffuse cerebral dysfunction that is non-specific in etiology and can be seen with hypoxic/ischemic injury, toxic/metabolic encephalopathies, or medication effect. Triphasic waves are typically seen with hepatic encephalopathy, but may be seen with other metabolic encephalopathies as well.The absence of epileptiform discharges does not rule out a clinical diagnosis of epilepsy. Clinical correlation is advised.   HOSPITAL COURSE:   Acute metabolic encephalopathy/tremor/rhythmic movements Etiology thought to be multifactorial including infection in the form of UTI, hyponatremia, C. difficile, acute renal failure and atrial fibrillation. Patient has been very slow to improve, but she has run slight improvement over the last few days. Patient underwent imaging studies in the form of CT scan and MRI brain which have not revealed any clear cut reason. Patient also underwent EEG which did not show any epileptiform activity. Patient has been seen by neurology. Vitamin B-12 and ammonia levels were normal. No further testing is thought to be necessary. Palliative medicine was consulted. Seen by speech therapy. Approved for dysphagia 1 diet. Aspiration precautions. She should continue to be followed by speech therapy at the skilled nursing facility  Atrial fibrillation with RVR/elevated troponin/demand ischemia Chads2vasc score is 5. Patient is being seen by cardiology. Patient was initially placed on amiodarone infusion and IV heparin. She was also on IV metoprolol. Now on oral amiodarone and metoprolol. On Eliquis. Heart rate is reasonably well controlled as long she takes her medications. We will continue loading with amiodarone and then titrate down dose.  C. difficile colitis. Stable. Diarrhea has resolved. Continue oral  vancomycin for 7 more days.  Acute on chronic kidney disease stage III.  Renal function close to baseline.  Urinary tract infection. No growth noted on urine cultures. Antibiotics were discontinued.  Hypernatremia/hypokalemia/hypomagnesemia Sodium level was normal. Potassium has improved. Magnesium level was normal.   Essential hypertension. Stable.  Diabetes mellitus type 2. A1c 6.6. Metformin was held and should continue to be held for now. Monitor CBGs at the skilled nursing facility.  Overall, stable. Discussed with patient's sister. She was told that patient's recovery will be extremely slow. At the same time, she is medically stable. Okay for discharge to skilled nursing facility today.   PERTINENT LABS:  The results of significant diagnostics from this hospitalization (including imaging, microbiology, ancillary and laboratory) are listed below for reference.    Microbiology: Recent Results (from the past 240 hour(s))  MRSA PCR Screening     Status: None   Collection Time: 07/03/17  2:29 PM  Result Value Ref Range Status   MRSA by PCR NEGATIVE NEGATIVE Final    Comment:        The GeneXpert MRSA Assay (FDA approved for NASAL specimens only), is one component of a comprehensive MRSA colonization surveillance program. It is not intended to diagnose MRSA infection nor to guide or monitor treatment for MRSA infections.   C difficile quick scan w PCR reflex     Status: Abnormal   Collection Time: 07/03/17  5:27 PM  Result Value Ref Range Status   C Diff antigen POSITIVE (A) NEGATIVE Final   C Diff toxin NEGATIVE NEGATIVE Final   C Diff interpretation Results are indeterminate. See PCR results.  Final  Clostridium Difficile by PCR     Status: Abnormal   Collection Time: 07/03/17  5:27 PM  Result Value Ref Range Status   Toxigenic C Difficile by pcr POSITIVE (A) NEGATIVE Final    Comment: Positive for toxigenic C. difficile with little to no toxin production.  Only treat if clinical presentation suggests symptomatic illness.     Labs: Basic Metabolic Panel:  Recent Labs Lab 07/06/17 0424 07/07/17 0335 07/08/17 0317 07/08/17 1428 07/09/17 0424 07/10/17 0543  NA 133* 136 132*  --  135 134*  K 3.4* 3.3* 3.4*  --  3.6 3.8  CL 103 102 100*  --  101 101  CO2 23 25 24   --  24 24  GLUCOSE 146* 135* 140*  --  135* 128*  BUN 10 9 8   --  11 13  CREATININE 0.72 0.77 0.76  --  0.96 0.92  CALCIUM 8.3* 8.2* 8.2*  --  8.7* 8.7*  MG 1.2*  --   --  1.2* 1.7 1.7   CBC:  Recent Labs Lab 07/05/17 0540 07/06/17 0424 07/07/17 0335 07/08/17 0317 07/09/17 0424  WBC 13.6* 13.3* 13.9* 14.0* 13.6*  HGB 10.5* 11.0* 11.2* 11.4* 11.7*  HCT 32.1* 33.3* 33.5* 33.8* 35.2*  MCV 90.7 89.5 89.1 88.7 89.6  PLT 307 330 332 347 372    CBG:  Recent Labs Lab 07/10/17 1718 07/10/17 2154 07/11/17 0107 07/11/17 0428 07/11/17 0939  GLUCAP 114* 132* 130* 126* 116*     IMAGING STUDIES Ct Head Wo Contrast  Result Date: 06/29/2017 CLINICAL DATA:  Confusion for 1 week. EXAM: CT HEAD WITHOUT CONTRAST TECHNIQUE: Contiguous axial images were obtained from the base of the skull through the vertex without intravenous contrast. COMPARISON:  Head CT scan 05/30/2017 in 03/06/2015. FINDINGS: Brain: There is some chronic microvascular ischemic change. No evidence of acute intracranial abnormality including hemorrhage, infarct, mass lesion, mass effect, midline shift or abnormal extra-axial  fluid collection. No hydrocephalus or pneumocephalus. Vascular: Atherosclerosis noted. Skull: Intact. Sinuses/Orbits: Negative. Other: None. IMPRESSION: No acute abnormality. Atherosclerosis. Mild appearing chronic microvascular ischemic change. Electronically Signed   By: Drusilla Kanner M.D.   On: 06/29/2017 16:25   Mr Brain Wo Contrast  Result Date: 07/01/2017 CLINICAL DATA:  Acute encephalopathy. Concern for acute stroke in the setting of atrial fibrillation. EXAM: MRI HEAD WITHOUT  CONTRAST TECHNIQUE: Multiplanar, multiecho pulse sequences of the brain and surrounding structures were obtained without intravenous contrast. COMPARISON:  Head CT from 2 days ago.  Brain MRI 12/05/2009 FINDINGS: Brain: No acute infarction, hemorrhage, hydrocephalus, extra-axial collection or mass lesion. Cerebral white matter disease attributed to chronic small vessel ischemia in this patient with multiple vascular risk factors. There has also been progressive subcortical white matter involvement. Chronic cystic intensity spaces lateral to the atrium of the right lateral ventricle, nonspecific. Brain volume remains normal for age. No evidence of chronic blood products. Vascular: Major flow voids are preserved. Skull and upper cervical spine: No evidence of marrow lesion Sinuses/Orbits: Negative Other: Intermittently motion degraded. IMPRESSION: 1. No acute or reversible finding. 2. 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 Electronically Signed   By: Marnee Spring M.D.   On: 07/01/2017 13:25   US Venous Img Lower Bilateral  Result Date: 06/30/2017 CLINICAL DATA:  Bilateral lower extremity pain and edema EXAM: BILATERAL LOWER EXTREMITY VENOUS DOPPLER ULTRASOUND TECHNIQUE: Gray-scale sonography with graded compression, as well as color Doppler and duplex ultrasound were performed to evaluate the lower extremity deep venous systems from the level of the common femoral vein and including the common femoral, femoral, profunda femoral, popliteal and calf veins including the posterior tibial, peroneal and gastrocnemius veins when visible. The superficial great saphenous vein was also interrogated. Spectral Doppler was utilized to evaluate flow at rest and with distal augmentation maneuvers in the common femoral, femoral and popliteal veins. COMPARISON:  None. FINDINGS: RIGHT LOWER EXTREMITY Common Femoral Vein: No evidence of thrombus. Normal compressibility,  respiratory phasicity and response to augmentation. Saphenofemoral Junction: No evidence of thrombus. Normal compressibility and flow on color Doppler imaging. Profunda Femoral Vein: No evidence of thrombus. Normal compressibility and flow on color Doppler imaging. Femoral Vein: No evidence of thrombus. Normal compressibility, respiratory phasicity and response to augmentation. Popliteal Vein: No evidence of thrombus. Normal compressibility, respiratory phasicity and response to augmentation. Calf Veins: No evidence of thrombus. Normal compressibility and flow on color Doppler imaging. Superficial Great Saphenous Vein: No evidence of thrombus. Normal compressibility and flow on color Doppler imaging. Marland Kitchen LEFT LOWER EXTREMITY Common Femoral Vein: No evidence of thrombus. Normal compressibility, respiratory phasicity and response to augmentation. Saphenofemoral Junction: No evidence of thrombus. Normal compressibility and flow on color Doppler imaging. Profunda Femoral Vein: No evidence of thrombus. Normal compressibility and flow on color Doppler imaging. Femoral Vein: No evidence of thrombus. Normal compressibility, respiratory phasicity and response to augmentation. Popliteal Vein: No evidence of thrombus. Normal compressibility, respiratory phasicity and response to augmentation. Calf Veins: No evidence of thrombus. Normal compressibility and flow on color Doppler imaging. Superficial Great Saphenous Vein: No evidence of thrombus. Normal compressibility and flow on color Doppler imaging. IMPRESSION: No evidence of DVT within either lower extremity. Electronically Signed   By: Genevive Bi M.D.   On: 06/30/2017 15:22   Dg Chest Port 1 View  Result Date: 06/30/2017 CLINICAL DATA:  Hypoxia EXAM: PORTABLE CHEST 1 VIEW COMPARISON:  June 29, 2017 FINDINGS: There is  no edema or consolidation. The heart size and pulmonary vascularity are normal. No adenopathy. There is aortic atherosclerosis. No evident bone lesions.  IMPRESSION: Aortic atherosclerosis.  No edema or consolidation. Aortic Atherosclerosis (ICD10-I70.0). Electronically Signed   By: Bretta Bang III M.D.   On: 06/30/2017 10:02   Dg Chest Port 1 View  Result Date: 06/29/2017 CLINICAL DATA:  Altered mental status. EXAM: PORTABLE CHEST 1 VIEW COMPARISON:  Abdominal series on 05/30/2017 FINDINGS: The heart size and mediastinal contours are within normal limits. Lung volumes are low bilaterally. There is no evidence of pulmonary edema, consolidation, pneumothorax, nodule or pleural fluid. The visualized skeletal structures are unremarkable. IMPRESSION: No active disease. Electronically Signed   By: Irish Lack M.D.   On: 06/29/2017 13:25   Dg Chest Port 1v Same Day  Result Date: 06/30/2017 CLINICAL DATA:  PICC line insertion. EXAM: PORTABLE CHEST 1 VIEW COMPARISON:  06/30/2017 . FINDINGS: PICC line noted with its tip projected over the SVC. Heart size normal. Low lung volumes with basilar atelectasis. No pleural effusion pneumothorax. IMPRESSION: PICC line noted with its tip projected over the SVC. Electronically Signed   By: Maisie Fus  Register   On: 06/30/2017 11:55   Dg Swallowing Func-speech Pathology  Result Date: 07/07/2017 Objective Swallowing Evaluation: Type of Study: MBS-Modified Barium Swallow Study Patient Details Name: ALONNI HEIMSOTH MRN: [id number] Date of Birth: 1941/10/05 Today's Date: 07/07/2017 Time: SLP Start Time (ACUTE ONLY): 1330-SLP Stop Time (ACUTE ONLY): 1350 SLP Time Calculation (min) (ACUTE ONLY): 20 min Past Medical History: Past Medical History: Diagnosis Date . Atrial fibrillation (HCC)  . Diabetes mellitus without complication (HCC)  . High cholesterol  . Hypertension  . Vertigo  Past Surgical History: Past Surgical History: Procedure Laterality Date . ABDOMINAL HYSTERECTOMY   . CHOLECYSTECTOMY   . CYST EXCISION   HPI: Kamariyah L Dameronis a 76 y.o.femalewith medical history significant of HTN, HLD, DM, vertigo and recent  (6/4-5) admission for new-onset afib and hyponatremia presenting with altered mental status. 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 Diagnosed with acute metabolic encephalopathy, A-fib, acute on chronic renal failure, sepsis (urine source vs possible aspiration). CXR 7/5 aortic atherosclerosis. No edema or consolidation. No Data Recorded Assessment / Plan / Recommendation CHL IP CLINICAL IMPRESSIONS 07/07/2017 Clinical Impression Pt mildly drowsy during MBS however alert enough for MBS. Oral holding and delayed transit likely due to cognitive impairments versus a true oral dysphagia. Holding boluses with delayed oral transit and unable to effecitvely masticate solid texture which was expectorated. One episode of flash penetration with thin with multiple cup and straw sips. Mild vallecular and pyriform sinus residue cleared with cues for second swallow. Recommend thin liquids, and puree (D1) and thin liquids, small sips straw allowed, crush meds, full supervision, sit upright and eat when alert.  SLP Visit Diagnosis Dysphagia, pharyngeal phase (R13.13) Attention and concentration deficit following -- Frontal lobe and executive function deficit following -- Impact on safety and function Mild aspiration risk   CHL IP TREATMENT RECOMMENDATION 07/07/2017 Treatment Recommendations Therapy as outlined in treatment plan below   Prognosis 07/07/2017 Prognosis for Safe Diet Advancement Good Barriers to Reach Goals -- Barriers/Prognosis Comment -- CHL IP DIET RECOMMENDATION 07/07/2017 SLP Diet Recommendations Dysphagia 1 (Puree) solids;Thin liquid Liquid Administration via Cup;Straw Medication Administration Crushed with puree Compensations Slow rate;Minimize environmental distractions;Small sips/bites Postural Changes Seated upright at 90 degrees   CHL IP OTHER RECOMMENDATIONS 07/07/2017 Recommended Consults --  Oral Care Recommendations Oral  care BID Other Recommendations --   CHL IP FOLLOW UP RECOMMENDATIONS 07/07/2017 Follow up Recommendations (No Data)   CHL IP FREQUENCY AND DURATION 07/07/2017 Speech Therapy Frequency (ACUTE ONLY) min 2x/week Treatment Duration 2 weeks      CHL IP ORAL PHASE 07/07/2017 Oral Phase Impaired Oral - Pudding Teaspoon -- Oral - Pudding Cup -- Oral - Honey Teaspoon -- Oral - Honey Cup -- Oral - Nectar Teaspoon -- Oral - Nectar Cup Delayed oral transit;Holding of bolus Oral - Nectar Straw -- Oral - Thin Teaspoon -- Oral - Thin Cup Holding of bolus;Delayed oral transit Oral - Thin Straw -- Oral - Puree -- Oral - Mech Soft -- Oral - Regular Impaired mastication Oral - Multi-Consistency Delayed oral transit Oral - Pill -- Oral Phase - Comment --  CHL IP PHARYNGEAL PHASE 07/07/2017 Pharyngeal Phase Impaired Pharyngeal- Pudding Teaspoon -- Pharyngeal -- Pharyngeal- Pudding Cup -- Pharyngeal -- Pharyngeal- Honey Teaspoon -- Pharyngeal -- Pharyngeal- Honey Cup -- Pharyngeal -- Pharyngeal- Nectar Teaspoon -- Pharyngeal -- Pharyngeal- Nectar Cup Pharyngeal residue - valleculae;Pharyngeal residue - pyriform;Reduced epiglottic inversion;Reduced laryngeal elevation Pharyngeal -- Pharyngeal- Nectar Straw -- Pharyngeal -- Pharyngeal- Thin Teaspoon -- Pharyngeal -- Pharyngeal- Thin Cup Pharyngeal residue - valleculae;Pharyngeal residue - pyriform;Reduced laryngeal elevation;Reduced epiglottic inversion Pharyngeal -- Pharyngeal- Thin Straw Pharyngeal residue - valleculae Pharyngeal -- Pharyngeal- Puree -- Pharyngeal -- Pharyngeal- Mechanical Soft -- Pharyngeal -- Pharyngeal- Regular (No Data) Pharyngeal -- Pharyngeal- Multi-consistency Pharyngeal residue - valleculae;Reduced epiglottic inversion Pharyngeal -- Pharyngeal- Pill -- Pharyngeal -- Pharyngeal Comment --  CHL IP CERVICAL ESOPHAGEAL PHASE 07/07/2017 Cervical Esophageal Phase WFL Pudding Teaspoon -- Pudding Cup -- Honey Teaspoon -- Honey Cup -- Nectar Teaspoon -- Nectar Cup -- Nectar  Straw -- Thin Teaspoon -- Thin Cup -- Thin Straw -- Puree -- Mechanical Soft -- Regular -- Multi-consistency -- Pill -- Cervical Esophageal Comment -- No flowsheet data found. Royce Macadamia 07/07/2017, 3:36 PM      [name] [name].Ed CCC-SLP Pager [phone]          DISCHARGE EXAMINATION: Vitals:   07/10/17 0603 07/10/17 1617 07/10/17 2152 07/11/17 0427  BP: (!) 149/97 (!) 158/78 133/88 115/71  Pulse: 80 99 97 86  Resp: 18 20 19 18   Temp:  98.5 F (36.9 C) 98.2 F (36.8 C) (!) 97.4 F (36.3 C)  TempSrc:  Oral Oral Oral  SpO2: 98% 97% 94% 96%  Weight:      Height:       General appearance: alert, distracted. Not very communicative. no distress Resp: clear to auscultation bilaterally Cardio: S1, S2 is irregularly irregular. No S3, S4, no rubs, murmurs or bruit GI: Abdomen soft. Nontender, nondistended. Bowel sounds are present. No masses or organomegaly  DISPOSITION: SNF  Discharge Instructions    Call MD for:  difficulty breathing, headache or visual disturbances    Complete by:  As directed    Call MD for:  extreme fatigue    Complete by:  As directed    Call MD for:  persistant dizziness or light-headedness    Complete by:  As directed    Call MD for:  persistant nausea and vomiting    Complete by:  As directed    Call MD for:  severe uncontrolled pain    Complete by:  As directed    Call MD for:  temperature >100.4    Complete by:  As directed    Discharge instructions    Complete by:  As directed    Please review instructions on the discharge summary  You were cared for by a hospitalist during your hospital stay. If you have any questions about your discharge medications or the care you received while you were in the hospital after you are discharged, you can call the unit and asked to speak with the hospitalist on call if the hospitalist that took care of you is not available. Once you are discharged, your primary care physician will handle any further medical  issues. Please note that NO REFILLS for any discharge medications will be authorized once you are discharged, as it is imperative that you return to your primary care physician (or establish a relationship with a primary care physician if you do not have one) for your aftercare needs so that they can reassess your need for medications and monitor your lab values. If you do not have a primary care physician, you can call [phone] for a physician referral.   Increase activity slowly    Complete by:  As directed       ALLERGIES: No Known Allergies   Current Discharge Medication List    START taking these medications   Details  amiodarone (PACERONE) 200 MG tablet 200mg  twice daily for 2 weeks and then 200mg  once daily.    nystatin (MYCOSTATIN/NYSTOP) powder Apply topically 3 (three) times daily. Qty: 15 g, Refills: 0    thiamine (VITAMIN B-1) 100 MG tablet Take 1 tablet (100 mg total) by mouth daily.    vancomycin (VANCOCIN) 50 mg/mL oral solution Take 10 mLs (500 mg total) by mouth every 6 (six) hours. For 7 more days. Till 07/18/17      CONTINUE these medications which have CHANGED   Details  Metoprolol Tartrate 37.5 MG TABS Take 75 mg by mouth 2 (two) times daily. Qty: 60 tablet, Refills: 0      CONTINUE these medications which have NOT CHANGED   Details  acetaminophen (TYLENOL) 500 MG tablet Take 500 mg by mouth daily as needed for pain.    apixaban (ELIQUIS) 5 MG TABS tablet Take 1 tablet (5 mg total) by mouth 2 (two) times daily. Qty: 60 tablet, Refills: 1    atorvastatin (LIPITOR) 40 MG tablet Take 40 mg by mouth every morning.     cholecalciferol (VITAMIN D) 1000 units tablet Take 1,000 Units by mouth daily.    Multiple Vitamin (MULTIVITAMIN WITH MINERALS) TABS Take 1 tablet by mouth daily.    vitamin B-12 (CYANOCOBALAMIN) 1000 MCG tablet Take 1,000 mcg by mouth daily.       STOP taking these medications     lisinopril (PRINIVIL,ZESTRIL) 20 MG tablet      metFORMIN  (GLUCOPHAGE-XR) 500 MG 24 hr tablet      omeprazole (PRILOSEC) 20 MG capsule          Follow-up Information    Pearson Grippe, MD. Schedule an appointment as soon as possible for a visit in 3 week(s).   Specialty:  Internal Medicine Contact information: [address] [place] [address] [phone]        Laqueta Linden, MD .   Specialty:  Cardiology Contact information: [id number] [place] [address] [phone]           TOTAL DISCHARGE TIME: 35 mins  [facility]  [facility] Pager [phone]  07/11/2017, 12:03 PM

## 2017-07-11 NOTE — Progress Notes (Signed)
Report given to Camillia HerterKatie Pulley, RN at [facility] in [place]. Patient discharged around 430p via EMS. PICC line removed, skin intact besides minor irritation on groin.   Sherlon HandingElisa Layna Roeper, LPN

## 2017-07-11 NOTE — Clinical Social Work Placement (Signed)
   CLINICAL SOCIAL WORK PLACEMENT  NOTE  Date:  07/11/2017  Patient Details  Name: Deborah Benjamin MRN: [id number] Date of Birth: Mar 29, 1941  Clinical Social Work is seeking post-discharge placement for this patient at the Skilled  Nursing Facility level of care (*CSW will initial, date and re-position this form in  chart as items are completed):  Yes   Patient/family provided with [facility] Social Work Department's list of facilities offering this level of care within the geographic area requested by the patient (or if unable, by the patient's family).  Yes   Patient/family informed of their freedom to choose among providers that offer the needed level of care, that participate in Medicare, Medicaid or managed care program needed by the patient, have an available bed and are willing to accept the patient.  Yes   Patient/family informed of [facility]'s ownership interest in [facility] and [facility], as well as of the fact that they are under no obligation to receive care at these facilities.  PASRR submitted to EDS on 07/08/17     PASRR number received on 07/08/17     Existing PASRR number confirmed on       FL2 transmitted to all facilities in geographic area requested by pt/family on 07/08/17     FL2 transmitted to all facilities within larger geographic area on       Patient informed that his/her managed care company has contracts with or will negotiate with certain facilities, including the following:        Yes   Patient/family informed of bed offers received.  [facility]     Physician recommends and patient chooses bed at      [facility] 07[facility]11/17.  Patient to be transferred to facility by [facility]     Patient family notified on 07/11/17 of transfer.  Name of family member notified:  Sister     PHYSICIAN       Additional Comment:     _______________________________________________ Mearl LatinNadia S Kaedance Magos, LCSWA 07/11/2017, 1:53 PM

## 2017-08-01 ENCOUNTER — Ambulatory Visit: Payer: Medicare Other | Admitting: Adult Health

## 2017-08-01 NOTE — Progress Notes (Deleted)
Cardiology Office Note   Date:  08/01/2017   ID:  Deborah Benjamin, DOB 1941-08-10, MRN [id number]  PCP:  Pearson Grippe, MD  Cardiologist:  Mammie Lorenzo chief complaint on file.     History of Present Illness: Deborah Benjamin is a 76 y.o. female who presents for post hospital follow-up with known history of atrial fibrillation, admitted with A. fib RVR, also had a history of altered mental status and worsening confusion prior to admission. She was found have urosepsis. Altered mental status related to acute metabolic encephalopathy. She did have troponin elevation which was felt to be related to demand ischemia in the setting of infection in rapid A. fib.  Echo 05/31/17  - Left ventricle: The cavity size was normal. Wall thickness was at the upper limits of normal. Systolic function was normal. The estimated ejection fraction was in the range of 60% to 65%. Wall motion was normal; there were no regional wall motion abnormalities. The study is not technically sufficient to allow evaluation of LV diastolic function. - Aortic valve: Mildly calcified annulus. Trileaflet. - Mitral valve: Calcified annulus. There was mild regurgitation. - Left atrium: The atrium was mildly to moderately dilated. - Right atrium: Central venous pressure (est): 8 mm Hg. - Atrial septum: No defect or patent foramen ovale was identified. - Tricuspid valve: There was trivial regurgitation. - Pulmonary arteries: PA peak pressure: 30 mm Hg (S). - Pericardium, extracardiac: There was no pericardial effusion.  She was apparently transferred to [facility] on 07/05/2017 in the setting of worsening mental status for neurologic evaluation and more aggressive antibiotic treatment for urosepsis. She was diagnosed with C. difficile. She was continued on anticoagulation therapy with ELIQUIS 5 mg twice a day, oral amiodarone 200 mg twice a day, she was discharged on 07/11/2017.   Past Medical History:  Diagnosis Date  .  Atrial fibrillation (HCC)   . Diabetes mellitus without complication (HCC)   . High cholesterol   . Hypertension   . Vertigo     Past Surgical History:  Procedure Laterality Date  . ABDOMINAL HYSTERECTOMY    . CHOLECYSTECTOMY    . CYST EXCISION       Current Outpatient Prescriptions  Medication Sig Dispense Refill  . acetaminophen (TYLENOL) 500 MG tablet Take 500 mg by mouth daily as needed for pain.    Marland Kitchen amiodarone (PACERONE) 200 MG tablet 200mg  twice daily for 2 weeks and then 200mg  once daily.    Marland Kitchen apixaban (ELIQUIS) 5 MG TABS tablet Take 1 tablet (5 mg total) by mouth 2 (two) times daily. 60 tablet 1  . atorvastatin (LIPITOR) 40 MG tablet Take 40 mg by mouth every morning.     . cholecalciferol (VITAMIN D) 1000 units tablet Take 1,000 Units by mouth daily.    . Metoprolol Tartrate 37.5 MG TABS Take 75 mg by mouth 2 (two) times daily. 60 tablet 0  . Multiple Vitamin (MULTIVITAMIN WITH MINERALS) TABS Take 1 tablet by mouth daily.    Marland Kitchen nystatin (MYCOSTATIN/NYSTOP) powder Apply topically 3 (three) times daily. 15 g 0  . thiamine (VITAMIN B-1) 100 MG tablet Take 1 tablet (100 mg total) by mouth daily.    . vancomycin (VANCOCIN) 50 mg/mL oral solution Take 10 mLs (500 mg total) by mouth every 6 (six) hours. For 7 more days. Till 07/18/17    . vitamin B-12 (CYANOCOBALAMIN) 1000 MCG tablet Take 1,000 mcg by mouth daily.      No current facility-administered medications for this visit.  Allergies:   Patient has no known allergies.    Social History:  The patient  reports that she has never smoked. She has never used smokeless tobacco. She reports that she does not drink alcohol or use drugs.   Family History:  The patient's family history includes CAD in her father; CVA (age of onset: 7478) in her mother; Diabetes in her sister.    ROS: All other systems are reviewed and negative. Unless otherwise mentioned in H&P    PHYSICAL EXAM: VS:  There were no vitals taken for this  visit. , BMI There is no height or weight on file to calculate BMI. GEN: Well nourished, well developed, in no acute distress  HEENT: normal  Neck: no JVD, carotid bruits, or masses Cardiac: RRR; no murmurs, rubs, or gallops,no edema  Respiratory:  clear to auscultation bilaterally, normal work of breathing GI: soft, nontender, nondistended, + BS MS: no deformity or atrophy  Skin: warm and dry, no rash Neuro:  Strength and sensation are intact Psych: euthymic mood, full affect   EKG:  EKG {ACTION; IS/IS [id number]} ordered today. The ekg ordered today demonstrates ***   Recent Labs: 06/29/2017: TSH 0.416 07/04/2017: ALT 49 07/09/2017: Hemoglobin 11.7; Platelets 372 07/10/2017: BUN 13; Creatinine, Ser 0.92; Magnesium 1.7; Potassium 3.8; Sodium 134    Lipid Panel    Component Value Date/Time   CHOL 148 05/31/2017 0512   TRIG 77 05/31/2017 0512   HDL 71 05/31/2017 0512   CHOLHDL 2.1 05/31/2017 0512   VLDL 15 05/31/2017 0512   LDLCALC 62 05/31/2017 0512      Wt Readings from Last 3 Encounters:  07/03/17 203 lb 7.8 oz (92.3 kg)  05/30/17 220 lb 11.2 oz (100.1 kg)  03/06/15 189 lb (85.7 kg)      Other studies Reviewed: Additional studies/ records that were reviewed today include: ***. Review of the above records demonstrates: ***   ASSESSMENT AND PLAN:  1.  ***   Current medicines are reviewed at length with the patient today.    Labs/ tests ordered today include: *** Bettey MareKathryn M. Liborio NixonLawrence DNP, ANP, AACC   08/01/2017 7:29 AM    [facility] [address], [place], [place] [address] Phone: [phone]; Fax: [phone]

## 2017-09-26 DIAGNOSIS — E1169 Type 2 diabetes mellitus with other specified complication: Secondary | ICD-10-CM | POA: Diagnosis not present

## 2017-09-26 DIAGNOSIS — Z87440 Personal history of urinary (tract) infections: Secondary | ICD-10-CM | POA: Diagnosis not present

## 2017-09-26 DIAGNOSIS — I10 Essential (primary) hypertension: Secondary | ICD-10-CM | POA: Diagnosis not present

## 2017-09-26 DIAGNOSIS — I4891 Unspecified atrial fibrillation: Secondary | ICD-10-CM | POA: Diagnosis not present

## 2017-09-26 DIAGNOSIS — E785 Hyperlipidemia, unspecified: Secondary | ICD-10-CM | POA: Diagnosis not present

## 2017-09-26 DIAGNOSIS — Z9181 History of falling: Secondary | ICD-10-CM | POA: Diagnosis not present

## 2017-09-26 DIAGNOSIS — R1312 Dysphagia, oropharyngeal phase: Secondary | ICD-10-CM | POA: Diagnosis not present

## 2017-09-27 DIAGNOSIS — Z9181 History of falling: Secondary | ICD-10-CM | POA: Diagnosis not present

## 2017-09-27 DIAGNOSIS — I10 Essential (primary) hypertension: Secondary | ICD-10-CM | POA: Diagnosis not present

## 2017-09-27 DIAGNOSIS — E1169 Type 2 diabetes mellitus with other specified complication: Secondary | ICD-10-CM | POA: Diagnosis not present

## 2017-09-27 DIAGNOSIS — I4891 Unspecified atrial fibrillation: Secondary | ICD-10-CM | POA: Diagnosis not present

## 2017-09-27 DIAGNOSIS — E785 Hyperlipidemia, unspecified: Secondary | ICD-10-CM | POA: Diagnosis not present

## 2017-09-27 DIAGNOSIS — R1312 Dysphagia, oropharyngeal phase: Secondary | ICD-10-CM | POA: Diagnosis not present

## 2017-09-27 DIAGNOSIS — Z87440 Personal history of urinary (tract) infections: Secondary | ICD-10-CM | POA: Diagnosis not present

## 2017-09-28 DIAGNOSIS — N179 Acute kidney failure, unspecified: Secondary | ICD-10-CM | POA: Diagnosis not present

## 2017-09-28 DIAGNOSIS — I10 Essential (primary) hypertension: Secondary | ICD-10-CM | POA: Diagnosis not present

## 2017-09-28 DIAGNOSIS — R197 Diarrhea, unspecified: Secondary | ICD-10-CM | POA: Diagnosis not present

## 2017-09-28 DIAGNOSIS — R42 Dizziness and giddiness: Secondary | ICD-10-CM | POA: Diagnosis not present

## 2017-09-28 DIAGNOSIS — E038 Other specified hypothyroidism: Secondary | ICD-10-CM | POA: Diagnosis not present

## 2017-09-28 DIAGNOSIS — E7849 Other hyperlipidemia: Secondary | ICD-10-CM | POA: Diagnosis not present

## 2017-09-28 DIAGNOSIS — R279 Unspecified lack of coordination: Secondary | ICD-10-CM | POA: Diagnosis not present

## 2017-09-28 DIAGNOSIS — E1165 Type 2 diabetes mellitus with hyperglycemia: Secondary | ICD-10-CM | POA: Diagnosis not present

## 2017-09-28 DIAGNOSIS — G9340 Encephalopathy, unspecified: Secondary | ICD-10-CM | POA: Diagnosis not present

## 2017-09-28 DIAGNOSIS — Z79899 Other long term (current) drug therapy: Secondary | ICD-10-CM | POA: Diagnosis not present

## 2017-09-28 DIAGNOSIS — Z7401 Bed confinement status: Secondary | ICD-10-CM | POA: Diagnosis not present

## 2017-09-28 DIAGNOSIS — E785 Hyperlipidemia, unspecified: Secondary | ICD-10-CM | POA: Diagnosis not present

## 2017-09-28 DIAGNOSIS — R2681 Unsteadiness on feet: Secondary | ICD-10-CM | POA: Diagnosis not present

## 2017-09-28 DIAGNOSIS — Z743 Need for continuous supervision: Secondary | ICD-10-CM | POA: Diagnosis not present

## 2017-09-28 DIAGNOSIS — D518 Other vitamin B12 deficiency anemias: Secondary | ICD-10-CM | POA: Diagnosis not present

## 2017-09-28 DIAGNOSIS — E119 Type 2 diabetes mellitus without complications: Secondary | ICD-10-CM | POA: Diagnosis not present

## 2017-09-28 DIAGNOSIS — M6281 Muscle weakness (generalized): Secondary | ICD-10-CM | POA: Diagnosis not present

## 2017-09-28 DIAGNOSIS — I4891 Unspecified atrial fibrillation: Secondary | ICD-10-CM | POA: Diagnosis not present

## 2017-09-28 DIAGNOSIS — I480 Paroxysmal atrial fibrillation: Secondary | ICD-10-CM | POA: Diagnosis not present

## 2017-09-30 DIAGNOSIS — E1165 Type 2 diabetes mellitus with hyperglycemia: Secondary | ICD-10-CM | POA: Diagnosis not present

## 2017-09-30 DIAGNOSIS — I4891 Unspecified atrial fibrillation: Secondary | ICD-10-CM | POA: Diagnosis not present

## 2017-09-30 DIAGNOSIS — I10 Essential (primary) hypertension: Secondary | ICD-10-CM | POA: Diagnosis not present

## 2017-10-03 DIAGNOSIS — E7849 Other hyperlipidemia: Secondary | ICD-10-CM | POA: Diagnosis not present

## 2017-10-03 DIAGNOSIS — D518 Other vitamin B12 deficiency anemias: Secondary | ICD-10-CM | POA: Diagnosis not present

## 2017-10-03 DIAGNOSIS — E119 Type 2 diabetes mellitus without complications: Secondary | ICD-10-CM | POA: Diagnosis not present

## 2017-10-03 DIAGNOSIS — Z79899 Other long term (current) drug therapy: Secondary | ICD-10-CM | POA: Diagnosis not present

## 2017-10-03 DIAGNOSIS — E038 Other specified hypothyroidism: Secondary | ICD-10-CM | POA: Diagnosis not present

## 2017-10-05 DIAGNOSIS — M6281 Muscle weakness (generalized): Secondary | ICD-10-CM | POA: Diagnosis not present

## 2017-10-05 DIAGNOSIS — E1165 Type 2 diabetes mellitus with hyperglycemia: Secondary | ICD-10-CM | POA: Diagnosis not present

## 2017-10-05 DIAGNOSIS — I10 Essential (primary) hypertension: Secondary | ICD-10-CM | POA: Diagnosis not present

## 2017-10-06 DIAGNOSIS — I10 Essential (primary) hypertension: Secondary | ICD-10-CM | POA: Diagnosis not present

## 2017-10-06 DIAGNOSIS — E1165 Type 2 diabetes mellitus with hyperglycemia: Secondary | ICD-10-CM | POA: Diagnosis not present

## 2017-10-06 DIAGNOSIS — I4891 Unspecified atrial fibrillation: Secondary | ICD-10-CM | POA: Diagnosis not present

## 2017-10-06 DIAGNOSIS — E785 Hyperlipidemia, unspecified: Secondary | ICD-10-CM | POA: Diagnosis not present

## 2017-10-14 DIAGNOSIS — I482 Chronic atrial fibrillation: Secondary | ICD-10-CM | POA: Diagnosis not present

## 2017-10-14 DIAGNOSIS — I10 Essential (primary) hypertension: Secondary | ICD-10-CM | POA: Diagnosis not present

## 2017-10-14 DIAGNOSIS — M6281 Muscle weakness (generalized): Secondary | ICD-10-CM | POA: Diagnosis not present

## 2017-10-14 DIAGNOSIS — E785 Hyperlipidemia, unspecified: Secondary | ICD-10-CM | POA: Diagnosis not present

## 2017-10-14 DIAGNOSIS — E1165 Type 2 diabetes mellitus with hyperglycemia: Secondary | ICD-10-CM | POA: Diagnosis not present

## 2017-10-14 DIAGNOSIS — Z7901 Long term (current) use of anticoagulants: Secondary | ICD-10-CM | POA: Diagnosis not present

## 2017-10-14 DIAGNOSIS — Z993 Dependence on wheelchair: Secondary | ICD-10-CM | POA: Diagnosis not present

## 2017-10-14 DIAGNOSIS — Z7984 Long term (current) use of oral hypoglycemic drugs: Secondary | ICD-10-CM | POA: Diagnosis not present

## 2017-10-14 DIAGNOSIS — Z9181 History of falling: Secondary | ICD-10-CM | POA: Diagnosis not present

## 2017-10-15 DIAGNOSIS — Z9181 History of falling: Secondary | ICD-10-CM | POA: Diagnosis not present

## 2017-10-15 DIAGNOSIS — Z993 Dependence on wheelchair: Secondary | ICD-10-CM | POA: Diagnosis not present

## 2017-10-15 DIAGNOSIS — Z7901 Long term (current) use of anticoagulants: Secondary | ICD-10-CM | POA: Diagnosis not present

## 2017-10-15 DIAGNOSIS — E785 Hyperlipidemia, unspecified: Secondary | ICD-10-CM | POA: Diagnosis not present

## 2017-10-15 DIAGNOSIS — M6281 Muscle weakness (generalized): Secondary | ICD-10-CM | POA: Diagnosis not present

## 2017-10-15 DIAGNOSIS — I10 Essential (primary) hypertension: Secondary | ICD-10-CM | POA: Diagnosis not present

## 2017-10-15 DIAGNOSIS — Z7984 Long term (current) use of oral hypoglycemic drugs: Secondary | ICD-10-CM | POA: Diagnosis not present

## 2017-10-15 DIAGNOSIS — E1165 Type 2 diabetes mellitus with hyperglycemia: Secondary | ICD-10-CM | POA: Diagnosis not present

## 2017-10-15 DIAGNOSIS — I482 Chronic atrial fibrillation: Secondary | ICD-10-CM | POA: Diagnosis not present

## 2017-10-18 DIAGNOSIS — Z993 Dependence on wheelchair: Secondary | ICD-10-CM | POA: Diagnosis not present

## 2017-10-18 DIAGNOSIS — I482 Chronic atrial fibrillation: Secondary | ICD-10-CM | POA: Diagnosis not present

## 2017-10-18 DIAGNOSIS — M6281 Muscle weakness (generalized): Secondary | ICD-10-CM | POA: Diagnosis not present

## 2017-10-18 DIAGNOSIS — E1165 Type 2 diabetes mellitus with hyperglycemia: Secondary | ICD-10-CM | POA: Diagnosis not present

## 2017-10-18 DIAGNOSIS — Z7901 Long term (current) use of anticoagulants: Secondary | ICD-10-CM | POA: Diagnosis not present

## 2017-10-18 DIAGNOSIS — Z9181 History of falling: Secondary | ICD-10-CM | POA: Diagnosis not present

## 2017-10-18 DIAGNOSIS — Z7984 Long term (current) use of oral hypoglycemic drugs: Secondary | ICD-10-CM | POA: Diagnosis not present

## 2017-10-18 DIAGNOSIS — I10 Essential (primary) hypertension: Secondary | ICD-10-CM | POA: Diagnosis not present

## 2017-10-18 DIAGNOSIS — E785 Hyperlipidemia, unspecified: Secondary | ICD-10-CM | POA: Diagnosis not present

## 2017-10-19 DIAGNOSIS — Z9181 History of falling: Secondary | ICD-10-CM | POA: Diagnosis not present

## 2017-10-19 DIAGNOSIS — E785 Hyperlipidemia, unspecified: Secondary | ICD-10-CM | POA: Diagnosis not present

## 2017-10-19 DIAGNOSIS — I480 Paroxysmal atrial fibrillation: Secondary | ICD-10-CM | POA: Diagnosis not present

## 2017-10-19 DIAGNOSIS — I482 Chronic atrial fibrillation: Secondary | ICD-10-CM | POA: Diagnosis not present

## 2017-10-19 DIAGNOSIS — E782 Mixed hyperlipidemia: Secondary | ICD-10-CM | POA: Diagnosis not present

## 2017-10-19 DIAGNOSIS — Z7984 Long term (current) use of oral hypoglycemic drugs: Secondary | ICD-10-CM | POA: Diagnosis not present

## 2017-10-19 DIAGNOSIS — Z993 Dependence on wheelchair: Secondary | ICD-10-CM | POA: Diagnosis not present

## 2017-10-19 DIAGNOSIS — I700 Atherosclerosis of aorta: Secondary | ICD-10-CM | POA: Diagnosis not present

## 2017-10-19 DIAGNOSIS — E1165 Type 2 diabetes mellitus with hyperglycemia: Secondary | ICD-10-CM | POA: Diagnosis not present

## 2017-10-19 DIAGNOSIS — I10 Essential (primary) hypertension: Secondary | ICD-10-CM | POA: Diagnosis not present

## 2017-10-19 DIAGNOSIS — M6281 Muscle weakness (generalized): Secondary | ICD-10-CM | POA: Diagnosis not present

## 2017-10-19 DIAGNOSIS — Z09 Encounter for follow-up examination after completed treatment for conditions other than malignant neoplasm: Secondary | ICD-10-CM | POA: Diagnosis not present

## 2017-10-19 DIAGNOSIS — Z23 Encounter for immunization: Secondary | ICD-10-CM | POA: Diagnosis not present

## 2017-10-19 DIAGNOSIS — Z7901 Long term (current) use of anticoagulants: Secondary | ICD-10-CM | POA: Diagnosis not present

## 2017-10-20 DIAGNOSIS — Z9181 History of falling: Secondary | ICD-10-CM | POA: Diagnosis not present

## 2017-10-20 DIAGNOSIS — I482 Chronic atrial fibrillation: Secondary | ICD-10-CM | POA: Diagnosis not present

## 2017-10-20 DIAGNOSIS — Z7901 Long term (current) use of anticoagulants: Secondary | ICD-10-CM | POA: Diagnosis not present

## 2017-10-20 DIAGNOSIS — Z7984 Long term (current) use of oral hypoglycemic drugs: Secondary | ICD-10-CM | POA: Diagnosis not present

## 2017-10-20 DIAGNOSIS — Z993 Dependence on wheelchair: Secondary | ICD-10-CM | POA: Diagnosis not present

## 2017-10-20 DIAGNOSIS — E1165 Type 2 diabetes mellitus with hyperglycemia: Secondary | ICD-10-CM | POA: Diagnosis not present

## 2017-10-20 DIAGNOSIS — M6281 Muscle weakness (generalized): Secondary | ICD-10-CM | POA: Diagnosis not present

## 2017-10-20 DIAGNOSIS — I10 Essential (primary) hypertension: Secondary | ICD-10-CM | POA: Diagnosis not present

## 2017-10-20 DIAGNOSIS — E785 Hyperlipidemia, unspecified: Secondary | ICD-10-CM | POA: Diagnosis not present

## 2017-10-21 DIAGNOSIS — I10 Essential (primary) hypertension: Secondary | ICD-10-CM | POA: Diagnosis not present

## 2017-10-21 DIAGNOSIS — Z993 Dependence on wheelchair: Secondary | ICD-10-CM | POA: Diagnosis not present

## 2017-10-21 DIAGNOSIS — I482 Chronic atrial fibrillation: Secondary | ICD-10-CM | POA: Diagnosis not present

## 2017-10-21 DIAGNOSIS — Z7984 Long term (current) use of oral hypoglycemic drugs: Secondary | ICD-10-CM | POA: Diagnosis not present

## 2017-10-21 DIAGNOSIS — E785 Hyperlipidemia, unspecified: Secondary | ICD-10-CM | POA: Diagnosis not present

## 2017-10-21 DIAGNOSIS — Z7901 Long term (current) use of anticoagulants: Secondary | ICD-10-CM | POA: Diagnosis not present

## 2017-10-21 DIAGNOSIS — E1165 Type 2 diabetes mellitus with hyperglycemia: Secondary | ICD-10-CM | POA: Diagnosis not present

## 2017-10-21 DIAGNOSIS — M6281 Muscle weakness (generalized): Secondary | ICD-10-CM | POA: Diagnosis not present

## 2017-10-21 DIAGNOSIS — Z9181 History of falling: Secondary | ICD-10-CM | POA: Diagnosis not present

## 2017-10-25 DIAGNOSIS — I10 Essential (primary) hypertension: Secondary | ICD-10-CM | POA: Diagnosis not present

## 2017-10-25 DIAGNOSIS — E1165 Type 2 diabetes mellitus with hyperglycemia: Secondary | ICD-10-CM | POA: Diagnosis not present

## 2017-10-25 DIAGNOSIS — I482 Chronic atrial fibrillation: Secondary | ICD-10-CM | POA: Diagnosis not present

## 2017-10-25 DIAGNOSIS — Z993 Dependence on wheelchair: Secondary | ICD-10-CM | POA: Diagnosis not present

## 2017-10-25 DIAGNOSIS — E785 Hyperlipidemia, unspecified: Secondary | ICD-10-CM | POA: Diagnosis not present

## 2017-10-25 DIAGNOSIS — Z7901 Long term (current) use of anticoagulants: Secondary | ICD-10-CM | POA: Diagnosis not present

## 2017-10-25 DIAGNOSIS — Z7984 Long term (current) use of oral hypoglycemic drugs: Secondary | ICD-10-CM | POA: Diagnosis not present

## 2017-10-25 DIAGNOSIS — M6281 Muscle weakness (generalized): Secondary | ICD-10-CM | POA: Diagnosis not present

## 2017-10-25 DIAGNOSIS — Z9181 History of falling: Secondary | ICD-10-CM | POA: Diagnosis not present

## 2017-10-27 DIAGNOSIS — Z7984 Long term (current) use of oral hypoglycemic drugs: Secondary | ICD-10-CM | POA: Diagnosis not present

## 2017-10-27 DIAGNOSIS — E1165 Type 2 diabetes mellitus with hyperglycemia: Secondary | ICD-10-CM | POA: Diagnosis not present

## 2017-10-27 DIAGNOSIS — Z993 Dependence on wheelchair: Secondary | ICD-10-CM | POA: Diagnosis not present

## 2017-10-27 DIAGNOSIS — M6281 Muscle weakness (generalized): Secondary | ICD-10-CM | POA: Diagnosis not present

## 2017-10-27 DIAGNOSIS — Z9181 History of falling: Secondary | ICD-10-CM | POA: Diagnosis not present

## 2017-10-27 DIAGNOSIS — I10 Essential (primary) hypertension: Secondary | ICD-10-CM | POA: Diagnosis not present

## 2017-10-27 DIAGNOSIS — Z7901 Long term (current) use of anticoagulants: Secondary | ICD-10-CM | POA: Diagnosis not present

## 2017-10-27 DIAGNOSIS — I482 Chronic atrial fibrillation: Secondary | ICD-10-CM | POA: Diagnosis not present

## 2017-10-27 DIAGNOSIS — E785 Hyperlipidemia, unspecified: Secondary | ICD-10-CM | POA: Diagnosis not present

## 2017-10-28 DIAGNOSIS — E785 Hyperlipidemia, unspecified: Secondary | ICD-10-CM | POA: Diagnosis not present

## 2017-10-28 DIAGNOSIS — I482 Chronic atrial fibrillation: Secondary | ICD-10-CM | POA: Diagnosis not present

## 2017-10-28 DIAGNOSIS — Z993 Dependence on wheelchair: Secondary | ICD-10-CM | POA: Diagnosis not present

## 2017-10-28 DIAGNOSIS — Z9181 History of falling: Secondary | ICD-10-CM | POA: Diagnosis not present

## 2017-10-28 DIAGNOSIS — I10 Essential (primary) hypertension: Secondary | ICD-10-CM | POA: Diagnosis not present

## 2017-10-28 DIAGNOSIS — Z7901 Long term (current) use of anticoagulants: Secondary | ICD-10-CM | POA: Diagnosis not present

## 2017-10-28 DIAGNOSIS — E1165 Type 2 diabetes mellitus with hyperglycemia: Secondary | ICD-10-CM | POA: Diagnosis not present

## 2017-10-28 DIAGNOSIS — M6281 Muscle weakness (generalized): Secondary | ICD-10-CM | POA: Diagnosis not present

## 2017-10-28 DIAGNOSIS — Z7984 Long term (current) use of oral hypoglycemic drugs: Secondary | ICD-10-CM | POA: Diagnosis not present

## 2017-11-10 ENCOUNTER — Encounter: Payer: Self-pay | Admitting: Internal Medicine

## 2018-05-27 DEATH — deceased

## 2018-07-01 IMAGING — DX DG ABDOMEN ACUTE W/ 1V CHEST
4 series · 4 of 4 positions shown · non-contrast
Comparison: Chest x-ray dated 03/06/2015 and radiograph dated
03/30/2017

CLINICAL DATA: Nausea, vomiting, and diarrhea for 2 weeks. Vertigo.

EXAM:
DG ABDOMEN ACUTE W/ 1V CHEST

[abdomen erect]
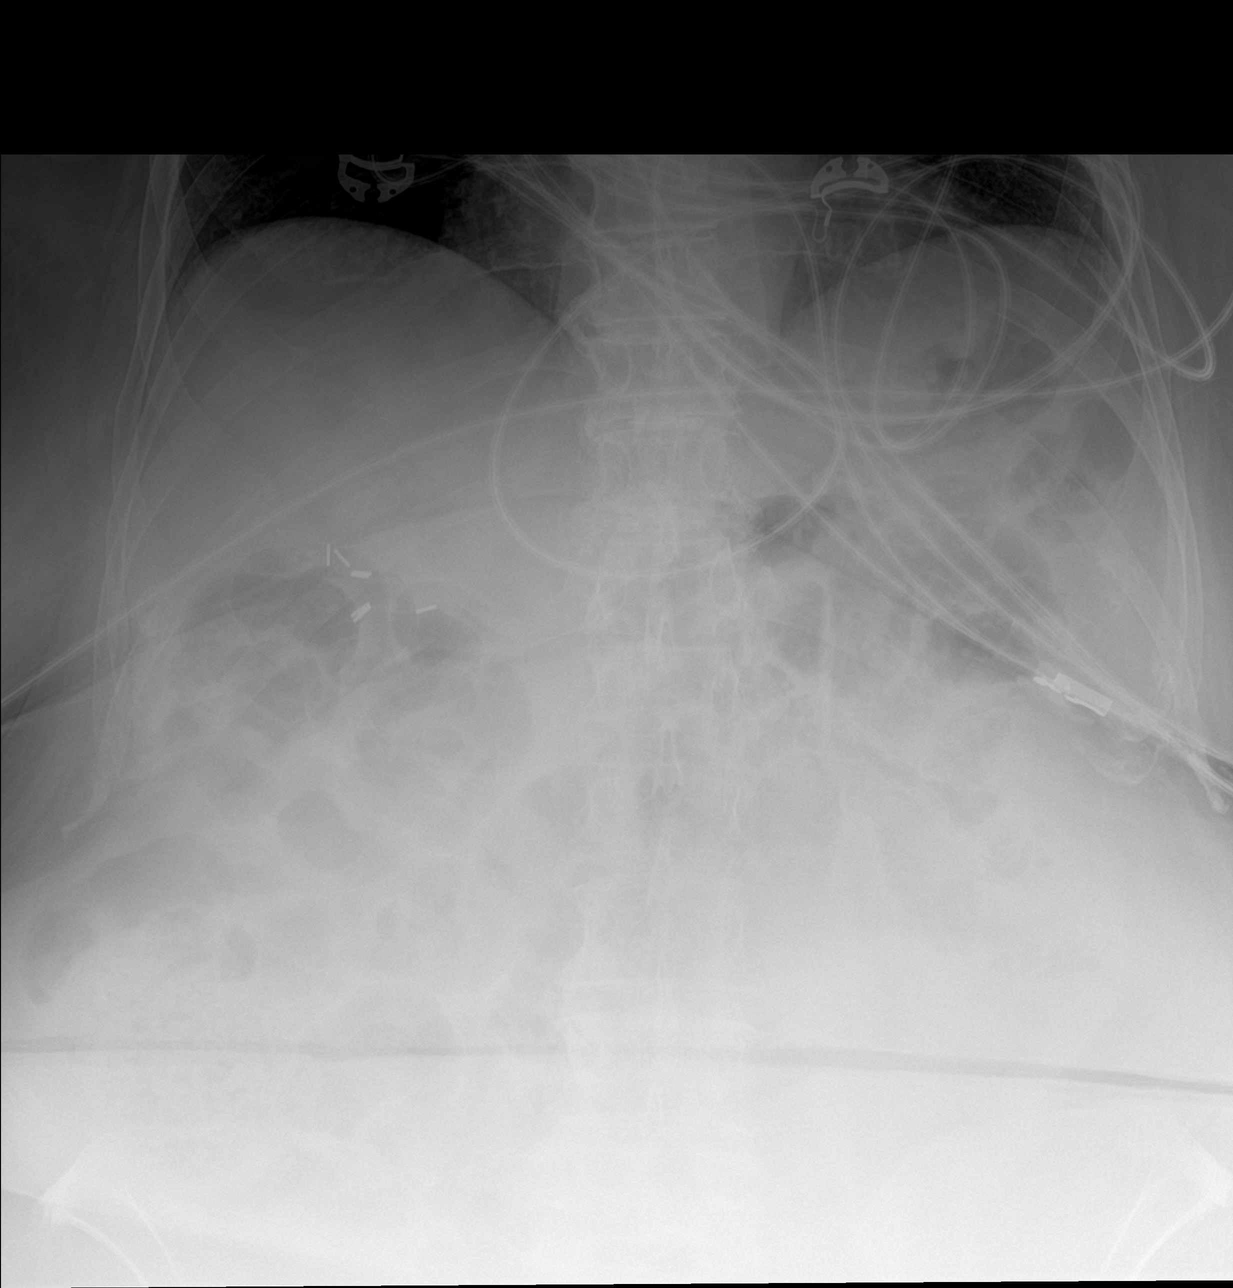

[abdomen supine (1 of 2)]
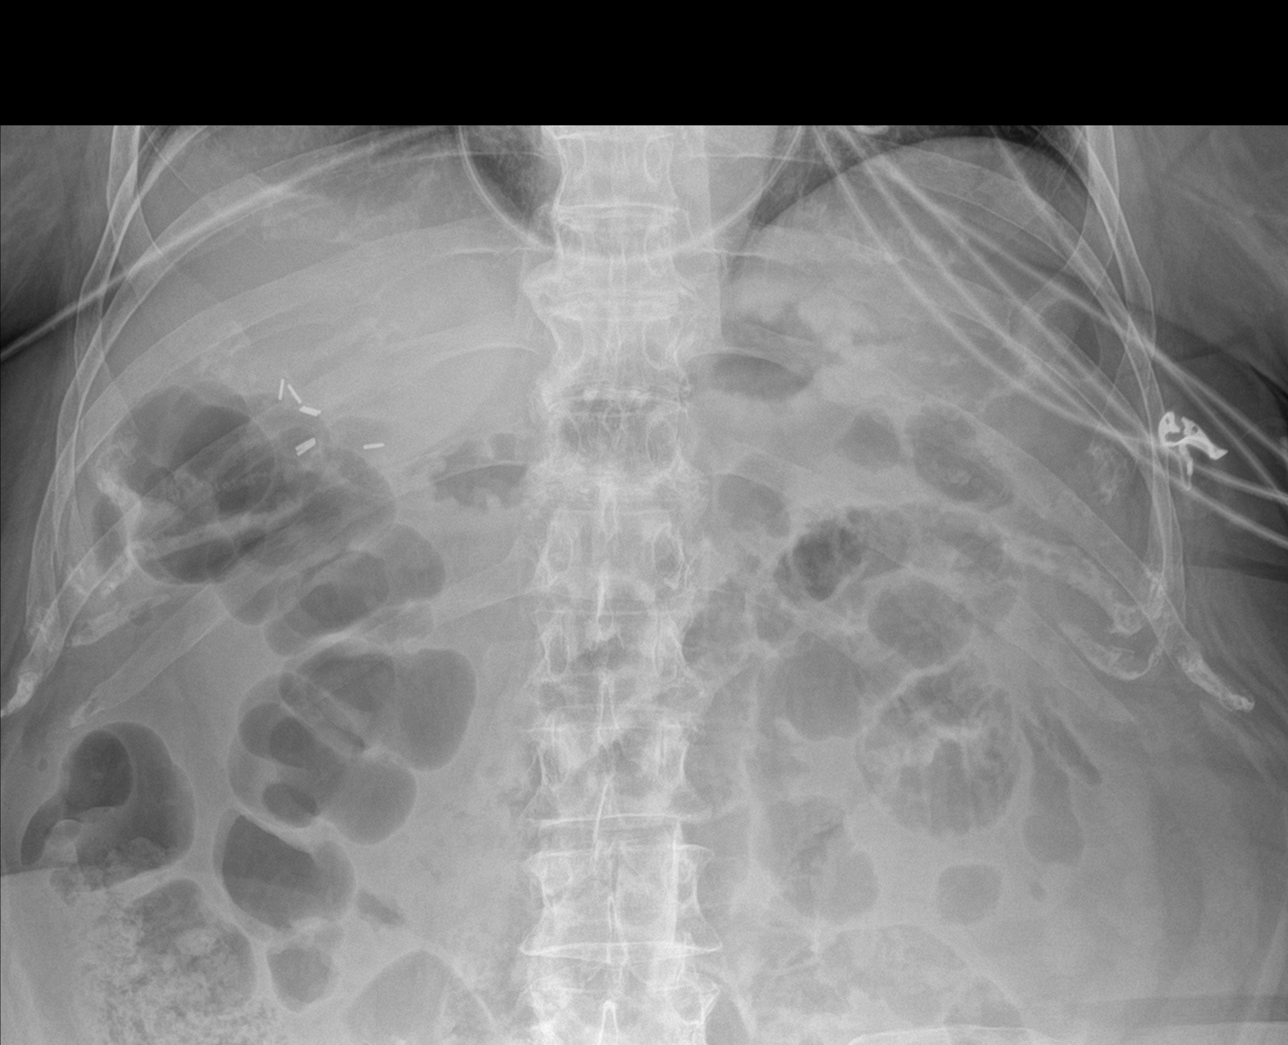

[abdomen supine (2 of 2)]
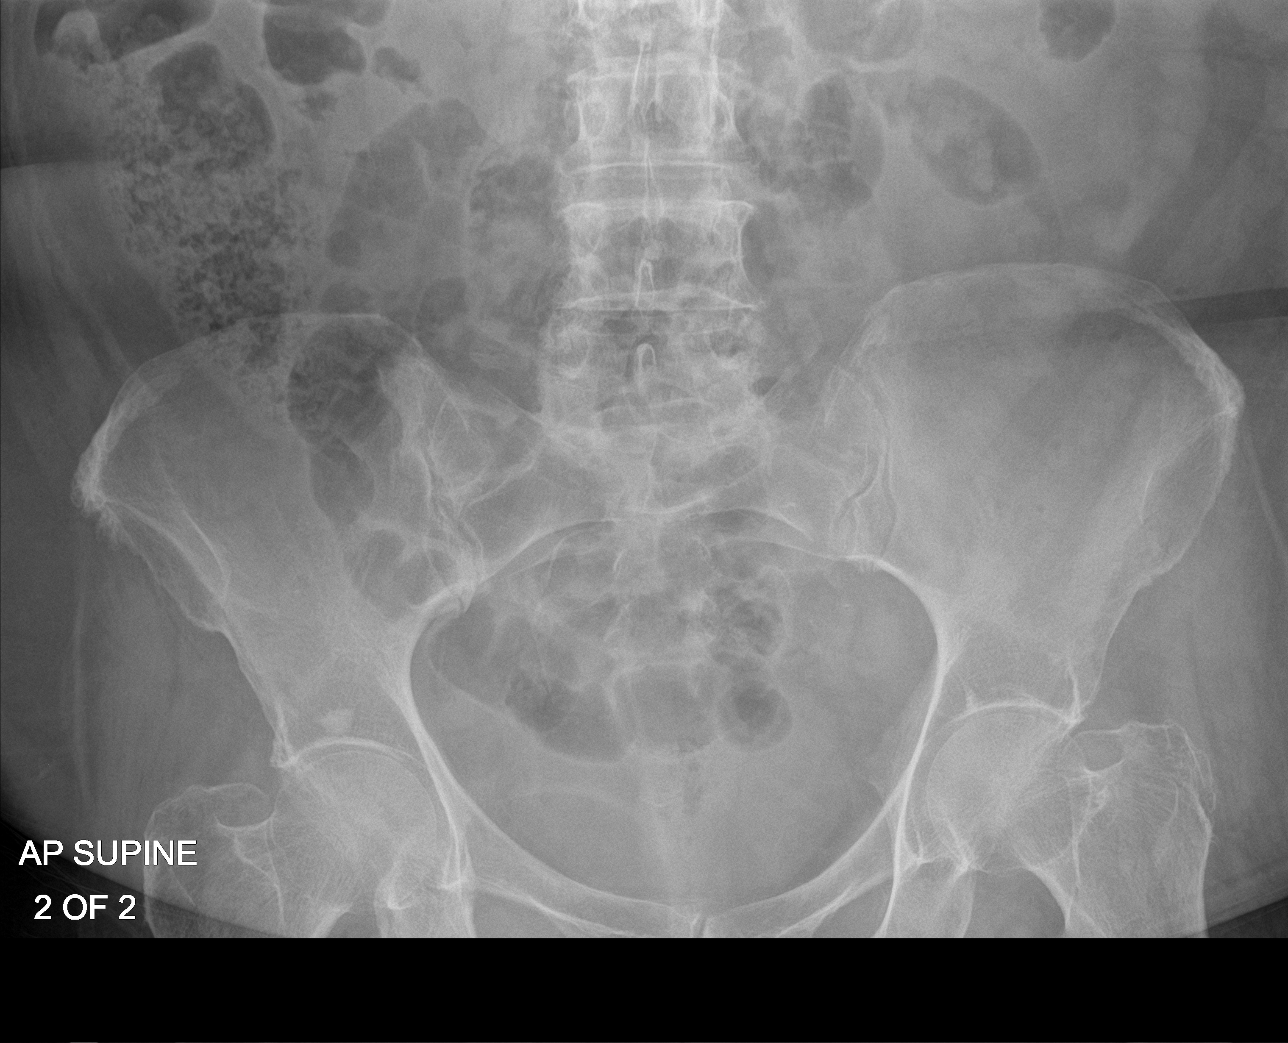

[chest ap]
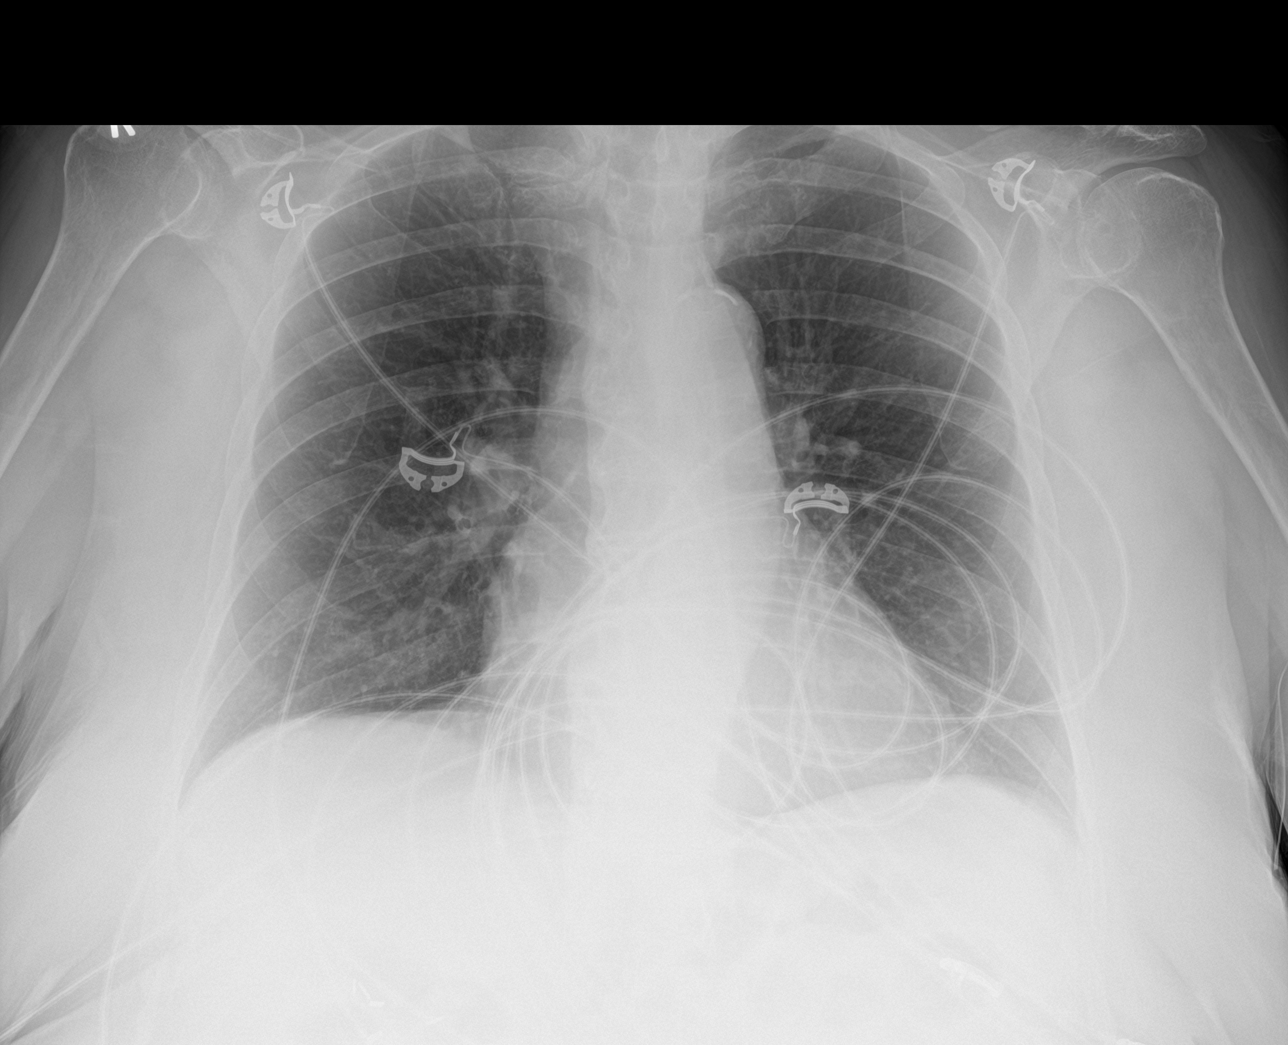

[4 of 4 positions shown; findings below may reference images not displayed]

FINDINGS: There is no evidence of dilated bowel loops or free intraperitoneal
air. No radiopaque calculi or other significant radiographic
abnormality is seen. Heart size and mediastinal contours are within
normal limits. Both lungs are clear.

Aortic atherosclerosis.
IMPRESSION: Negative abdominal radiographs.  No acute cardiopulmonary disease.

Aortic atherosclerosis.

## 2018-07-01 IMAGING — CT CT HEAD W/O CM
4 series · 17 of 47 positions shown, 19 images · non-contrast
Comparison: 03/06/2015

CLINICAL DATA: Dizziness for 2 weeks.

EXAM:
CT HEAD WITHOUT CONTRAST
TECHNIQUE: Contiguous axial images were obtained from the base of the skull
through the vertex without intravenous contrast.

[Series 2: head trauma wo · axial · 0.43mm/px · z∈[-37,+83]mm · 7 of 32 slices shown, 9 images]
[im 4/32  brain]
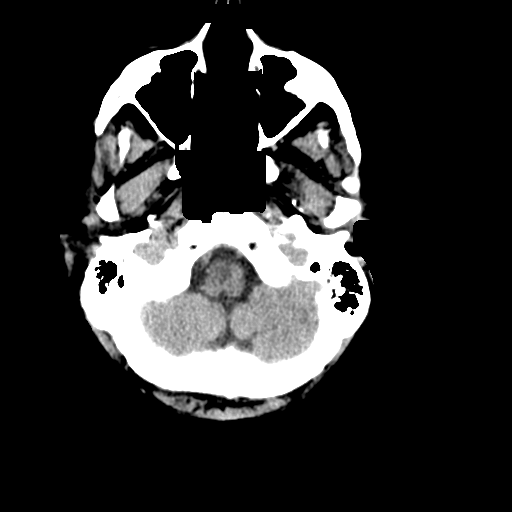
[im 4/32  bone]
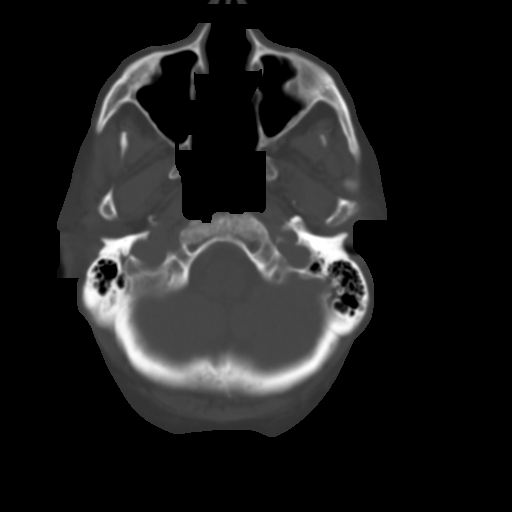
[im 8/32  brain]
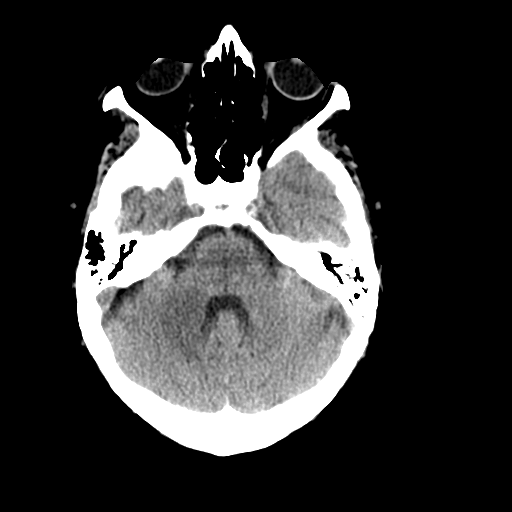
[im 12/32  brain]
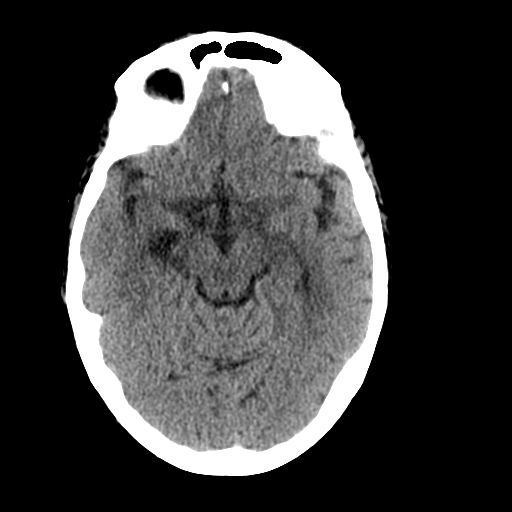
[im 16/32  brain]
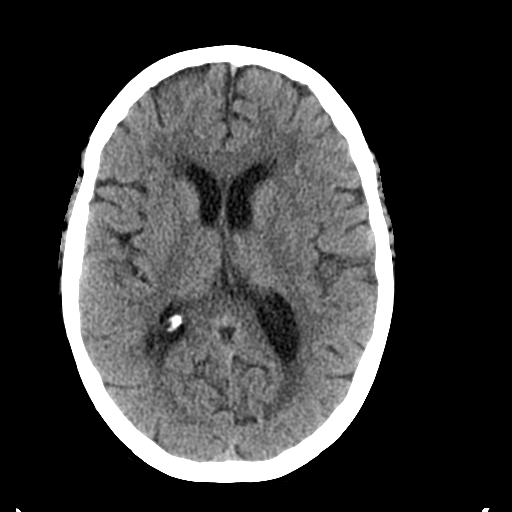
[im 20/32  brain]
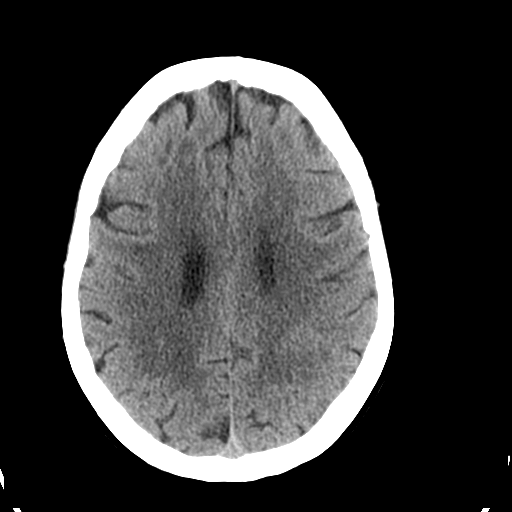
[im 20/32  bone]
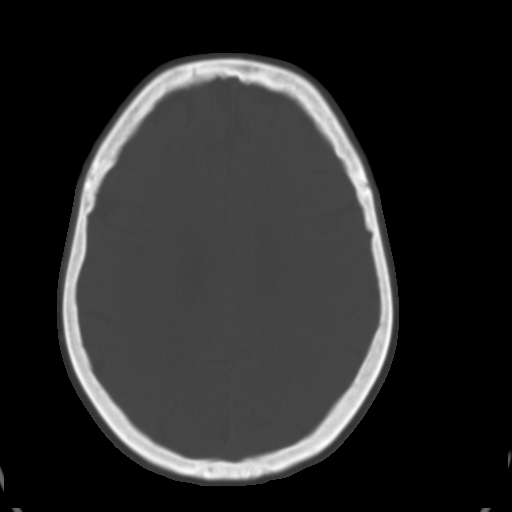
[im 24/32  brain]
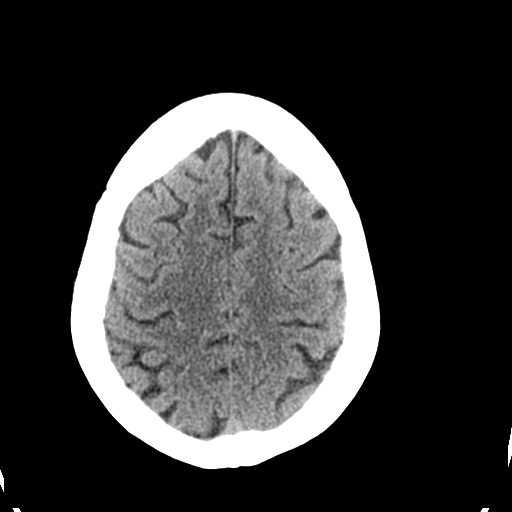
[im 28/32  brain]
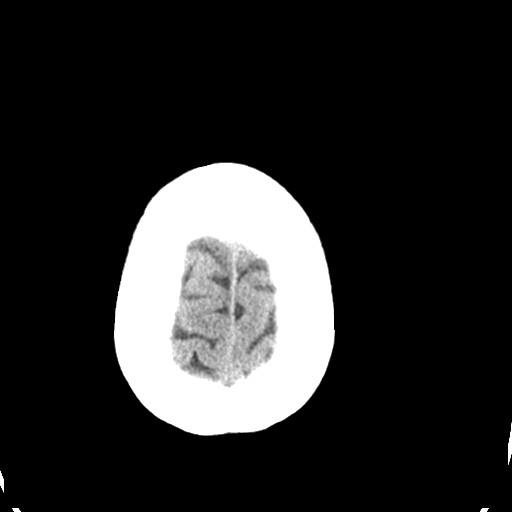

[Series 3: head bone · axial · 0.43mm/px · z∈[-38,+18]mm · 4 of 80 slices shown]
[im 8/80  bone]
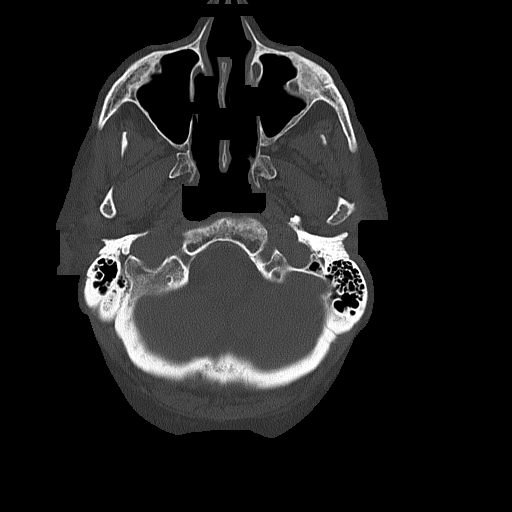
[im 16/80  bone]
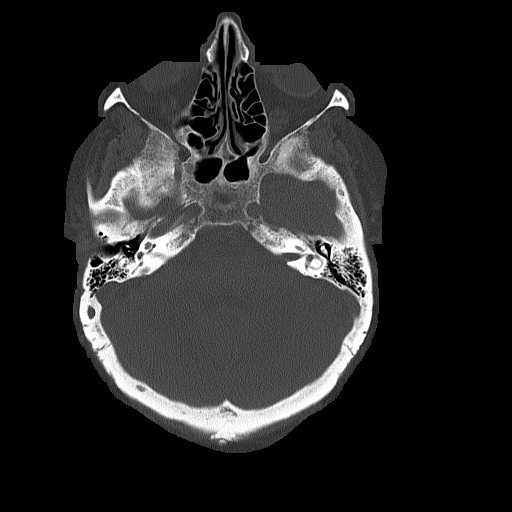
[im 24/80  bone]
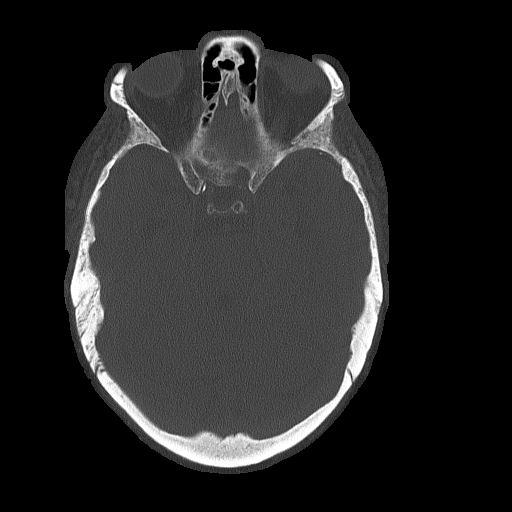
[im 36/80  bone]
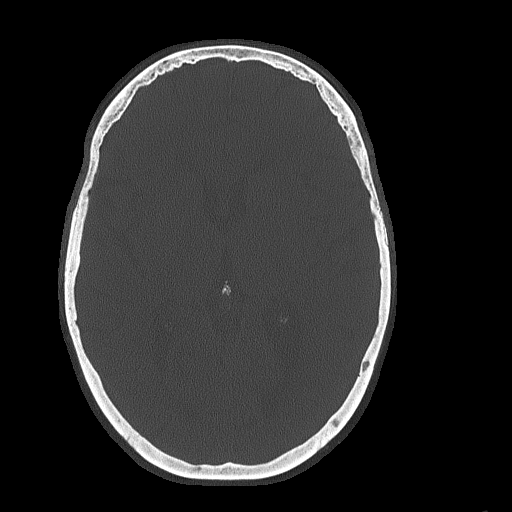

[Series 4: coronal soft tissue · coronal · 0.32mm/px · 3 of 69 slices shown]
[im 23/69  brain]
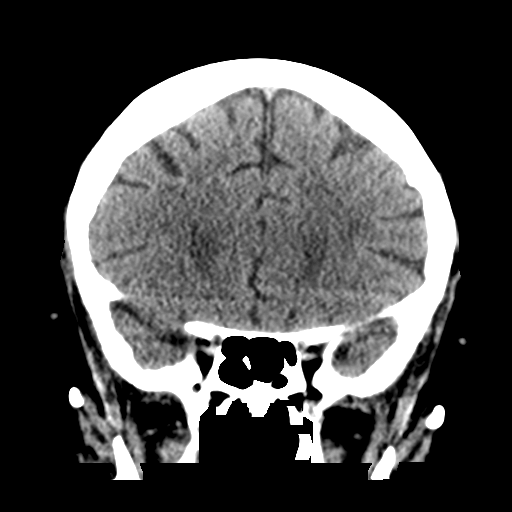
[im 31/69  brain]
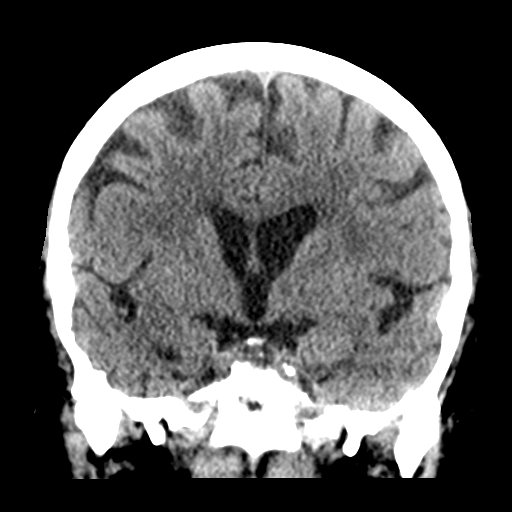
[im 38/69  brain]
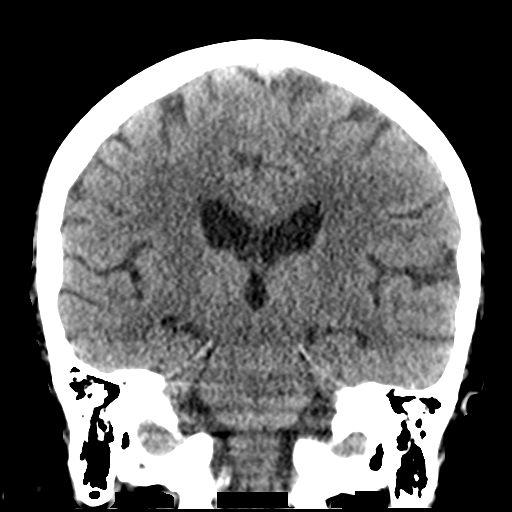

[Series 5: sagittal soft tissue · sagittal · 0.33mm/px · 3 of 53 slices shown]
[im 18/53  brain]
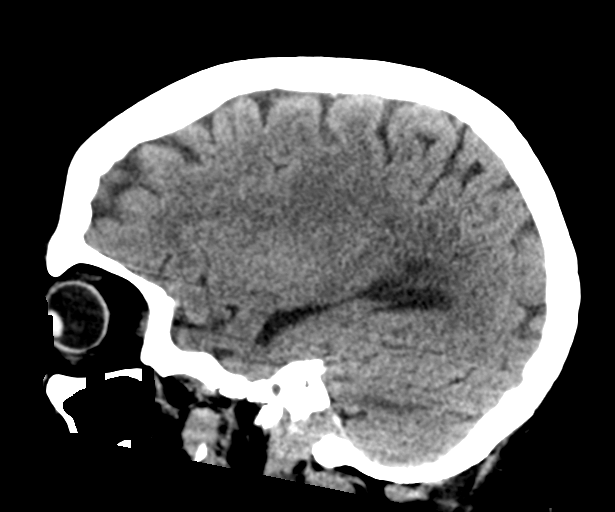
[im 27/53  brain]
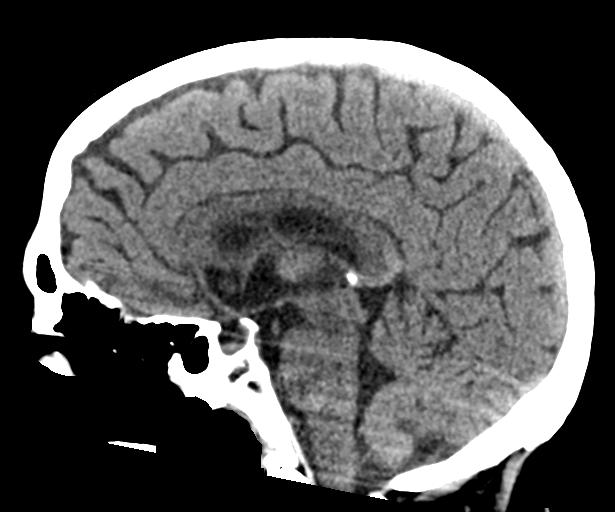
[im 35/53  brain]
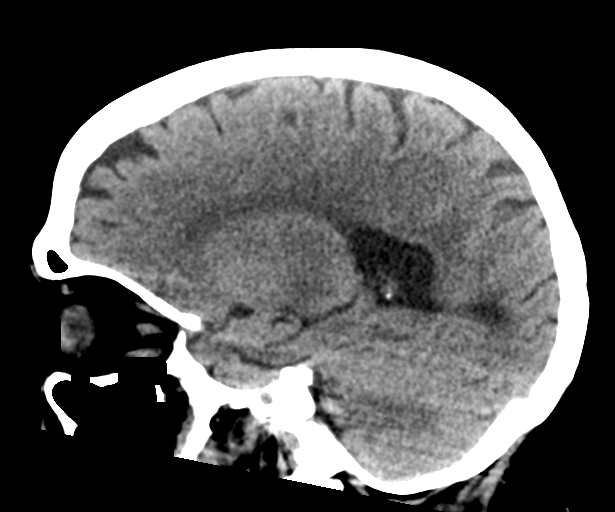

[17 of 47 positions shown; findings below may reference images not displayed]

FINDINGS: Brain: No evidence of acute infarction, hemorrhage, hydrocephalus,
extra-axial collection or mass lesion/mass effect. Mild
periventricular microangiopathy.

Vascular: Calcific atherosclerotic disease at the skullbase.

Skull: Normal. Negative for fracture or focal lesion.

Sinuses/Orbits: No acute finding.

Other: None.
IMPRESSION: No acute intracranial abnormality.

Mild chronic microvascular disease.

## 2018-07-31 IMAGING — CR DG CHEST 1V PORT
1 series · 1 of 1 positions shown · non-contrast
Comparison: Abdominal series on 05/30/2017

CLINICAL DATA: Altered mental status.

EXAM:
PORTABLE CHEST 1 VIEW

[portable]
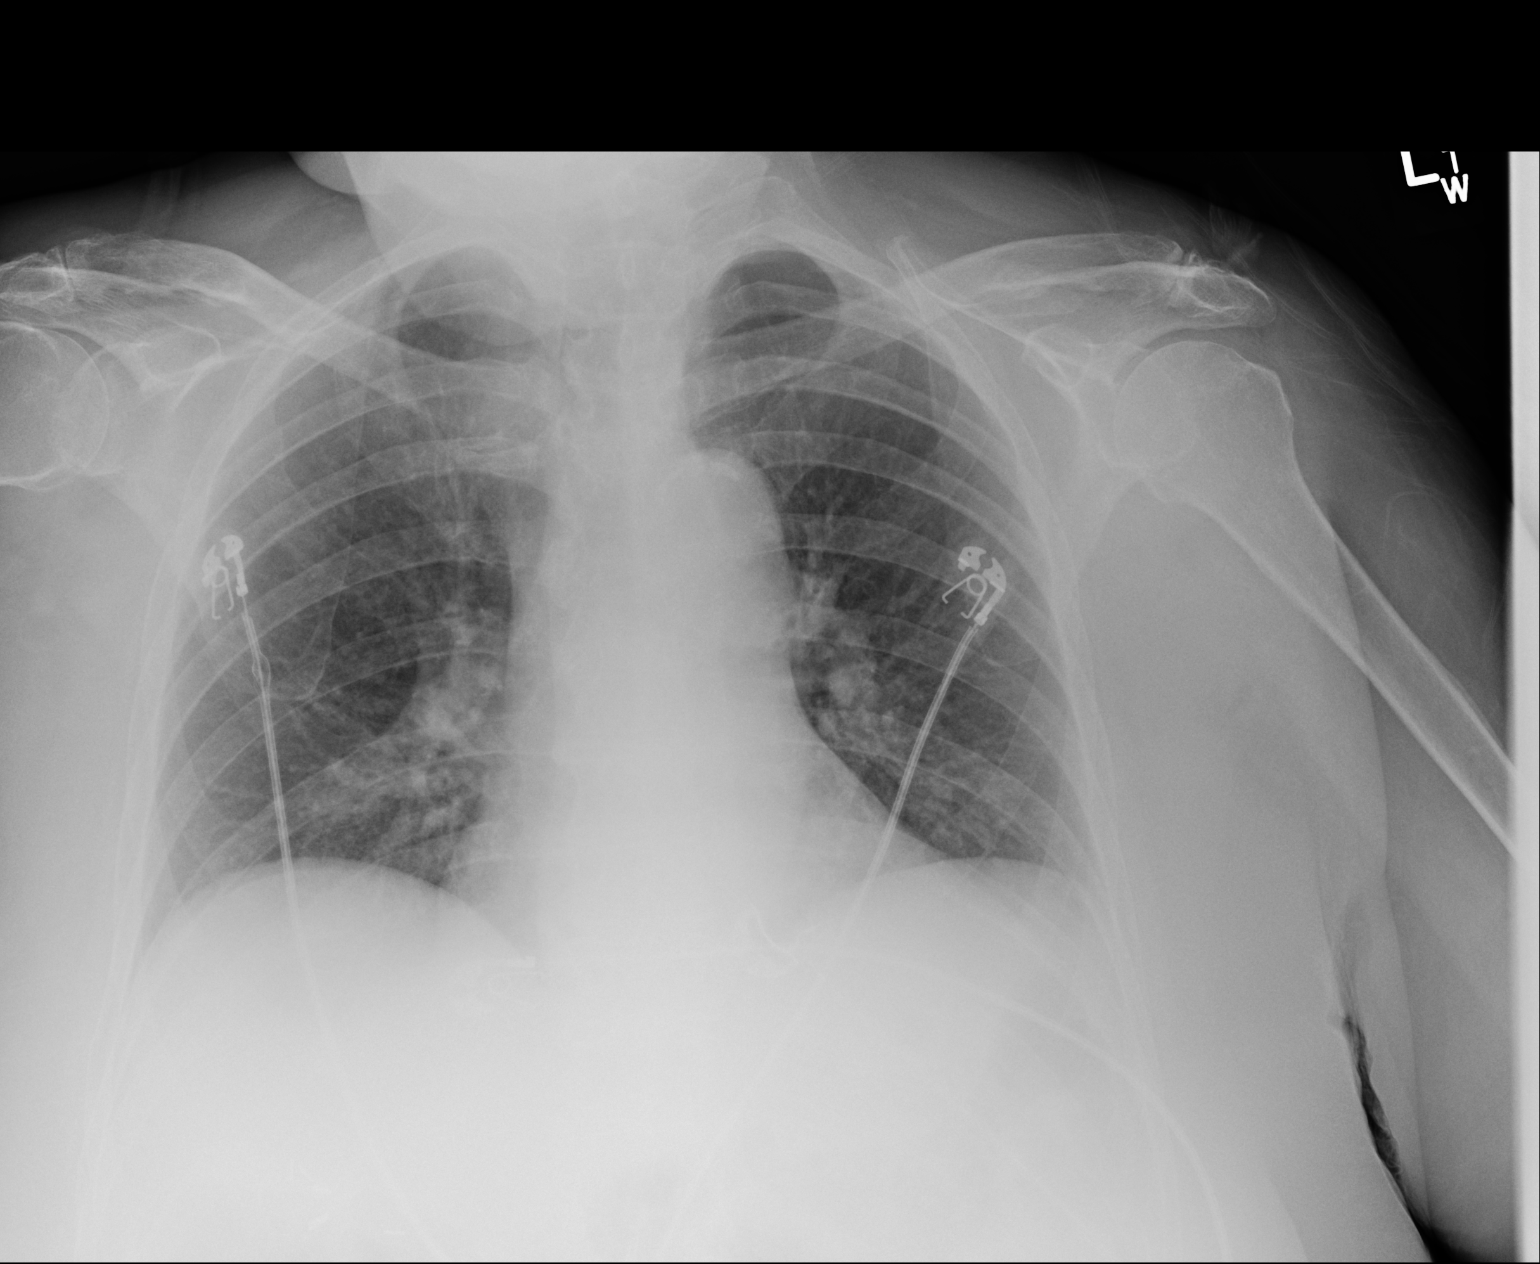

[1 of 1 positions shown; findings below may reference images not displayed]

FINDINGS: The heart size and mediastinal contours are within normal limits.
Lung volumes are low bilaterally. There is no evidence of pulmonary
edema, consolidation, pneumothorax, nodule or pleural fluid. The
visualized skeletal structures are unremarkable.
IMPRESSION: No active disease.

## 2018-08-01 IMAGING — CR DG CHEST 1V PORT
1 series · 1 of 1 positions shown · non-contrast
Comparison: June 29, 2017

CLINICAL DATA: Hypoxia

EXAM:
PORTABLE CHEST 1 VIEW

[portable]
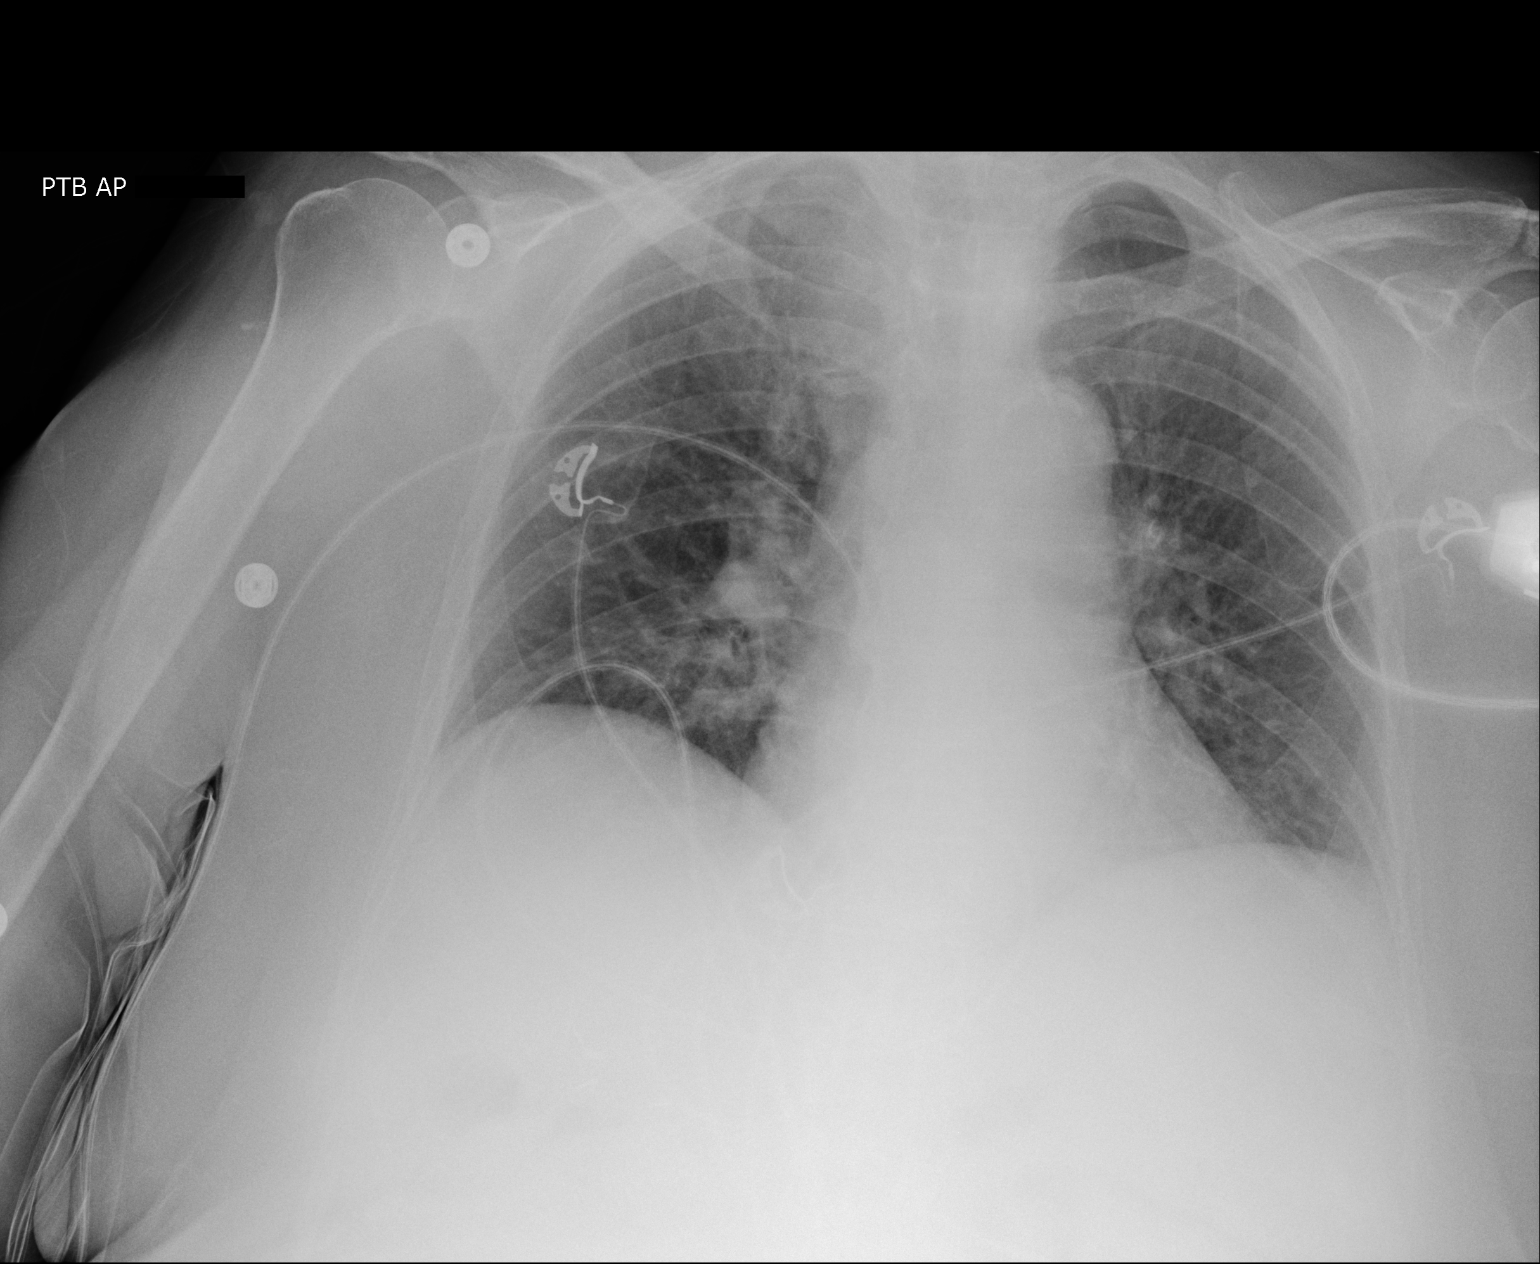

[1 of 1 positions shown; findings below may reference images not displayed]

FINDINGS: There is no edema or consolidation. The heart size and pulmonary
vascularity are normal. No adenopathy. There is aortic
atherosclerosis. No evident bone lesions.
IMPRESSION: Aortic atherosclerosis.  No edema or consolidation.

Aortic Atherosclerosis ([id number]).

## 2018-12-30 IMAGING — US US EXTREM LOW VENOUS BILAT
1 series · 13 of 24 positions shown · non-contrast
Comparison: None.

CLINICAL DATA: Bilateral lower extremity pain and edema



[Series 1: us extrem low venous bilat · 0.09mm/px · 13 of 75 slices shown]
[im 1/75]
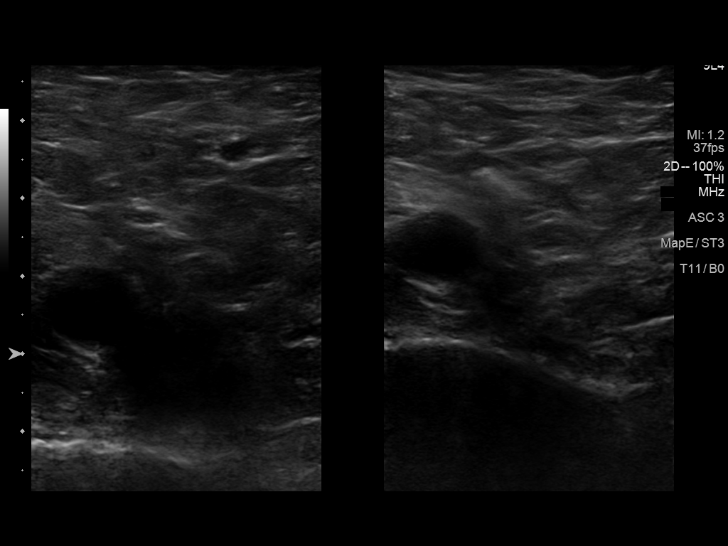
[im 7/75]
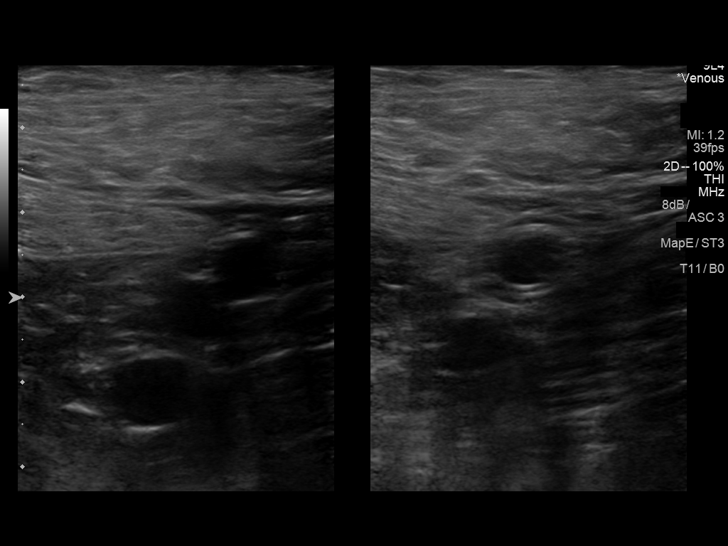
[im 13/75]
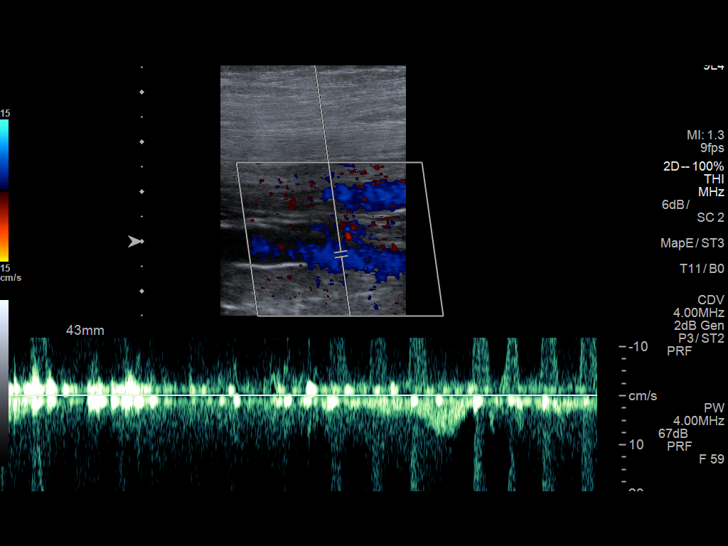
[im 20/75]
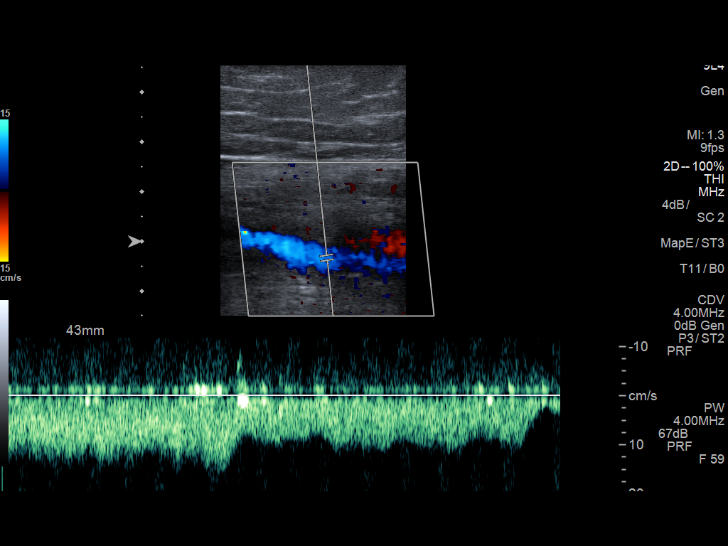
[im 26/75]
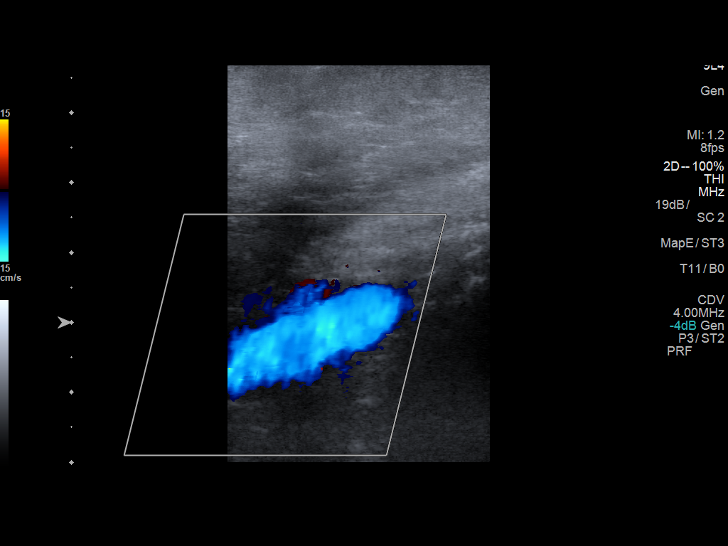
[im 33/75]
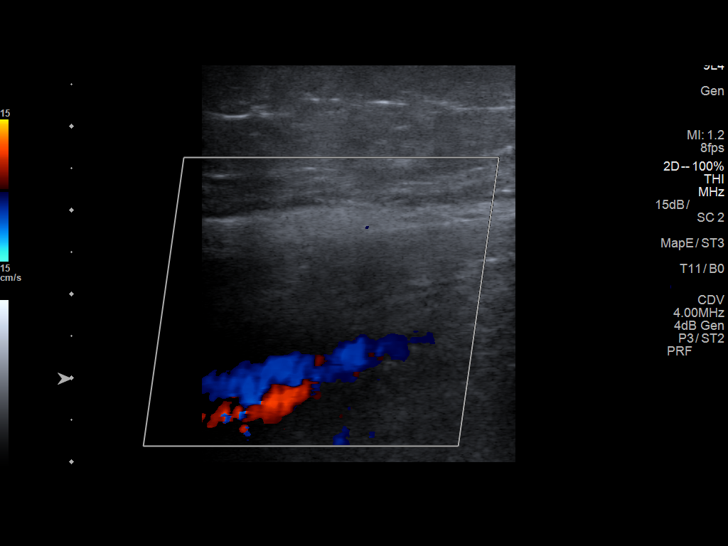
[im 39/75]
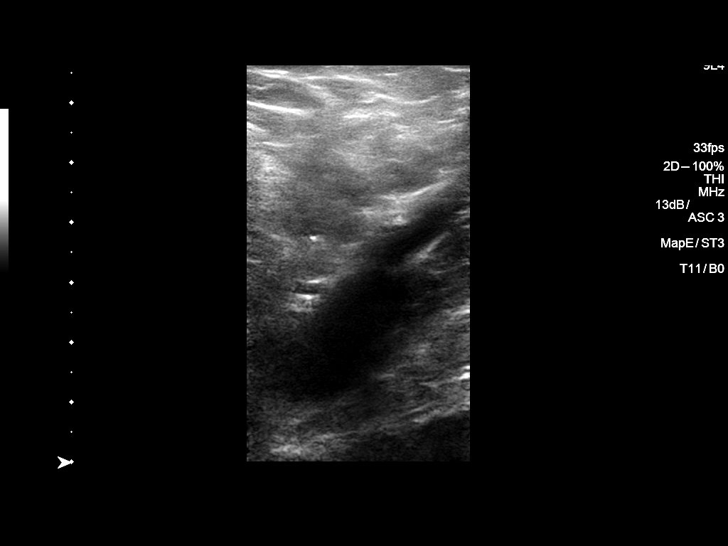
[im 42/75]
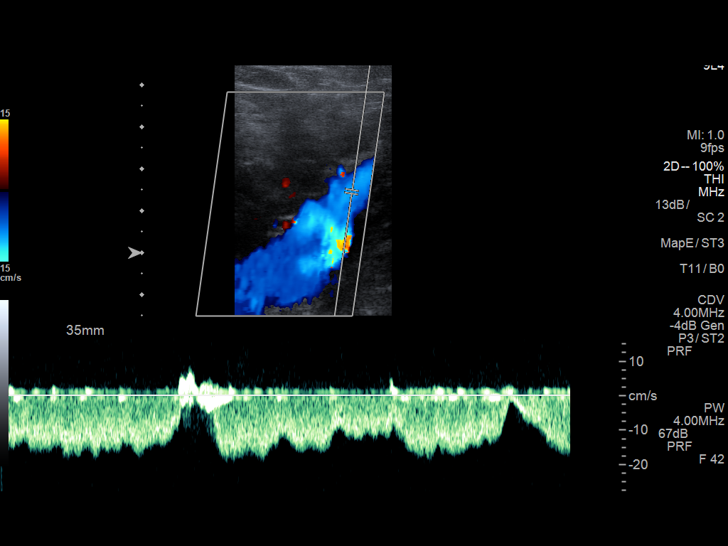
[im 49/75]
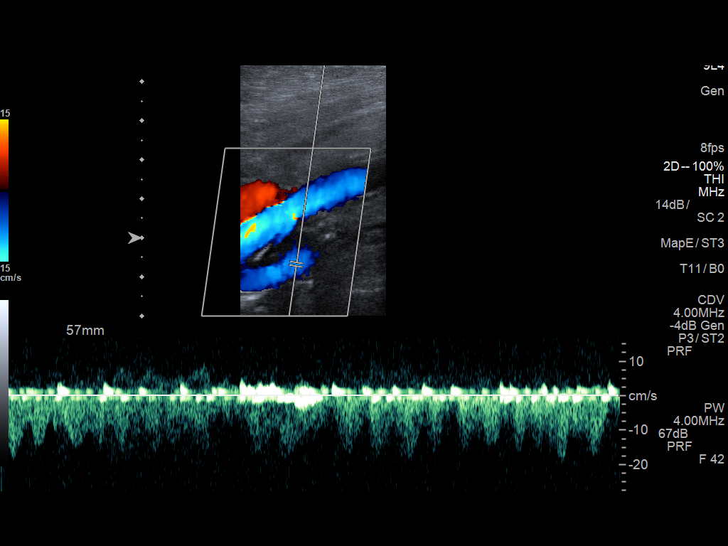
[im 55/75]
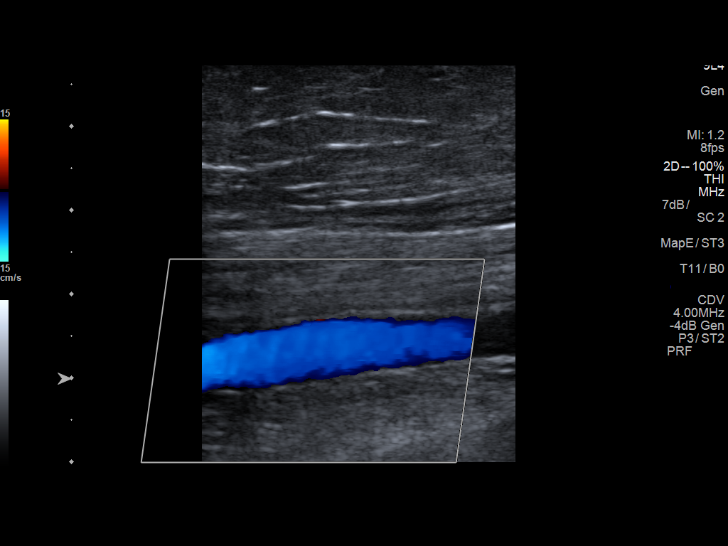
[im 62/75]
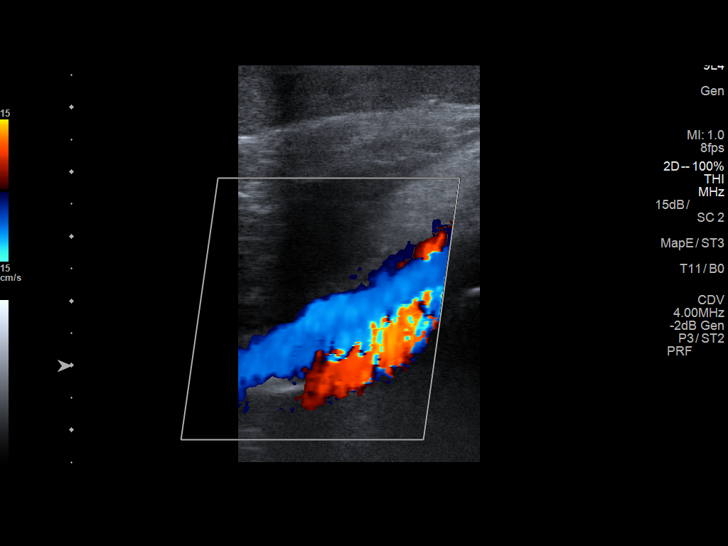
[im 68/75]
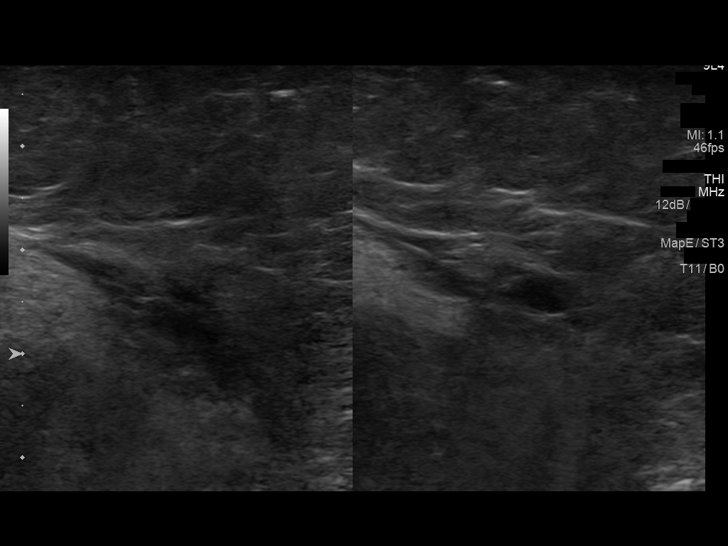
[im 75/75]
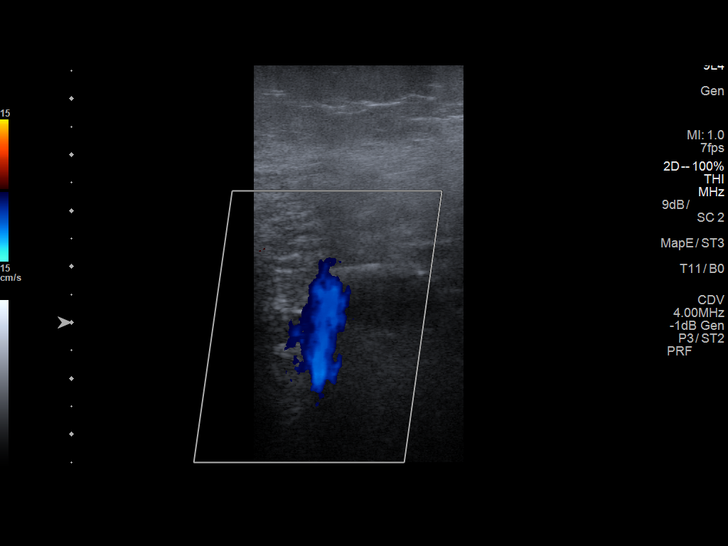

[13 of 24 positions shown; findings below may reference images not displayed]

FINDINGS: RIGHT LOWER EXTREMITY

Common Femoral Vein: No evidence of thrombus. Normal
compressibility, respiratory phasicity and response to augmentation.

Saphenofemoral Junction: No evidence of thrombus. Normal
compressibility and flow on color Doppler imaging.

Profunda Femoral Vein: No evidence of thrombus. Normal
compressibility and flow on color Doppler imaging.

Femoral Vein: No evidence of thrombus. Normal compressibility,
respiratory phasicity and response to augmentation.

Popliteal Vein: No evidence of thrombus. Normal compressibility,
respiratory phasicity and response to augmentation.

Calf Veins: No evidence of thrombus. Normal compressibility and flow
on color Doppler imaging.

Superficial Great Saphenous Vein: No evidence of thrombus. Normal
compressibility and flow on color Doppler imaging.

.

LEFT LOWER EXTREMITY

Common Femoral Vein: No evidence of thrombus. Normal
compressibility, respiratory phasicity and response to augmentation.

Saphenofemoral Junction: No evidence of thrombus. Normal
compressibility and flow on color Doppler imaging.

Profunda Femoral Vein: No evidence of thrombus. Normal
compressibility and flow on color Doppler imaging.

Femoral Vein: No evidence of thrombus. Normal compressibility,
respiratory phasicity and response to augmentation.

Popliteal Vein: No evidence of thrombus. Normal compressibility,
respiratory phasicity and response to augmentation.

Calf Veins: No evidence of thrombus. Normal compressibility and flow
on color Doppler imaging.

Superficial Great Saphenous Vein: No evidence of thrombus. Normal
compressibility and flow on color Doppler imaging.
IMPRESSION: No evidence of DVT within either lower extremity.
